# Patient Record
Sex: Female | Born: 1935 | Race: Asian | Hispanic: No | Marital: Single | Smoking: Never smoker
Health system: Southern US, Community
[De-identification: ages and names within clinical notes are randomized; demographics above are authoritative.]

## PROBLEM LIST (undated history)

## (undated) DIAGNOSIS — I10 Essential (primary) hypertension: Secondary | ICD-10-CM

## (undated) DIAGNOSIS — K219 Gastro-esophageal reflux disease without esophagitis: Secondary | ICD-10-CM

---

## 2014-01-07 ENCOUNTER — Inpatient Hospital Stay (HOSPITAL_COMMUNITY)
Admission: EM | Admit: 2014-01-07 | Discharge: 2014-01-11 | DRG: 683 | Disposition: A | Discharge status: Home / Self Care | Payer: Medicare Other | Attending: Internal Medicine | Admitting: Internal Medicine

## 2014-01-07 ENCOUNTER — Emergency Department (HOSPITAL_COMMUNITY): Payer: Medicare Other

## 2014-01-07 ENCOUNTER — Encounter (HOSPITAL_COMMUNITY): Payer: Self-pay | Admitting: Emergency Medicine

## 2014-01-07 DIAGNOSIS — R404 Transient alteration of awareness: Secondary | ICD-10-CM | POA: Diagnosis not present

## 2014-01-07 DIAGNOSIS — K560 Paralytic ileus: Secondary | ICD-10-CM | POA: Diagnosis present

## 2014-01-07 DIAGNOSIS — N19 Unspecified kidney failure: Secondary | ICD-10-CM | POA: Diagnosis not present

## 2014-01-07 DIAGNOSIS — R5383 Other fatigue: Secondary | ICD-10-CM

## 2014-01-07 DIAGNOSIS — Z79899 Other long term (current) drug therapy: Secondary | ICD-10-CM

## 2014-01-07 DIAGNOSIS — N1330 Unspecified hydronephrosis: Secondary | ICD-10-CM

## 2014-01-07 DIAGNOSIS — K219 Gastro-esophageal reflux disease without esophagitis: Secondary | ICD-10-CM | POA: Diagnosis present

## 2014-01-07 DIAGNOSIS — R5381 Other malaise: Secondary | ICD-10-CM

## 2014-01-07 DIAGNOSIS — Z7982 Long term (current) use of aspirin: Secondary | ICD-10-CM | POA: Diagnosis not present

## 2014-01-07 DIAGNOSIS — R109 Unspecified abdominal pain: Secondary | ICD-10-CM | POA: Diagnosis present

## 2014-01-07 DIAGNOSIS — Z23 Encounter for immunization: Secondary | ICD-10-CM | POA: Diagnosis not present

## 2014-01-07 DIAGNOSIS — L259 Unspecified contact dermatitis, unspecified cause: Secondary | ICD-10-CM | POA: Diagnosis present

## 2014-01-07 DIAGNOSIS — E871 Hypo-osmolality and hyponatremia: Secondary | ICD-10-CM | POA: Diagnosis present

## 2014-01-07 DIAGNOSIS — R1030 Lower abdominal pain, unspecified: Secondary | ICD-10-CM

## 2014-01-07 DIAGNOSIS — N179 Acute kidney failure, unspecified: Principal | ICD-10-CM | POA: Diagnosis present

## 2014-01-07 DIAGNOSIS — R339 Retention of urine, unspecified: Secondary | ICD-10-CM | POA: Diagnosis present

## 2014-01-07 DIAGNOSIS — R609 Edema, unspecified: Secondary | ICD-10-CM

## 2014-01-07 DIAGNOSIS — R1031 Right lower quadrant pain: Secondary | ICD-10-CM | POA: Diagnosis not present

## 2014-01-07 DIAGNOSIS — R531 Weakness: Secondary | ICD-10-CM | POA: Diagnosis present

## 2014-01-07 DIAGNOSIS — M7989 Other specified soft tissue disorders: Secondary | ICD-10-CM | POA: Diagnosis not present

## 2014-01-07 DIAGNOSIS — K567 Ileus, unspecified: Secondary | ICD-10-CM

## 2014-01-07 DIAGNOSIS — R600 Localized edema: Secondary | ICD-10-CM

## 2014-01-07 HISTORY — DX: Gastro-esophageal reflux disease without esophagitis: K21.9

## 2014-01-07 LAB — URINALYSIS, ROUTINE W REFLEX MICROSCOPIC
BILIRUBIN URINE: NEGATIVE
Glucose, UA: NEGATIVE mg/dL
Hgb urine dipstick: NEGATIVE
KETONES UR: NEGATIVE mg/dL
Leukocytes, UA: NEGATIVE
NITRITE: NEGATIVE
Protein, ur: NEGATIVE mg/dL
Specific Gravity, Urine: 1.014 (ref 1.005–1.030)
UROBILINOGEN UA: 0.2 mg/dL (ref 0.0–1.0)
pH: 5.5 (ref 5.0–8.0)

## 2014-01-07 LAB — CBC WITH DIFFERENTIAL/PLATELET
BASOS ABS: 0 10*3/uL (ref 0.0–0.1)
BASOS PCT: 0 % (ref 0–1)
EOS PCT: 1 % (ref 0–5)
Eosinophils Absolute: 0.1 10*3/uL (ref 0.0–0.7)
HEMATOCRIT: 30.7 % — AB (ref 36.0–46.0)
HEMOGLOBIN: 11.1 g/dL — AB (ref 12.0–15.0)
Lymphocytes Relative: 20 % (ref 12–46)
Lymphs Abs: 2.3 10*3/uL (ref 0.7–4.0)
MCH: 30.7 pg (ref 26.0–34.0)
MCHC: 36.2 g/dL — AB (ref 30.0–36.0)
MCV: 84.8 fL (ref 78.0–100.0)
MONOS PCT: 6 % (ref 3–12)
Monocytes Absolute: 0.7 10*3/uL (ref 0.1–1.0)
Neutro Abs: 8.6 10*3/uL — ABNORMAL HIGH (ref 1.7–7.7)
Neutrophils Relative %: 73 % (ref 43–77)
Platelets: 240 10*3/uL (ref 150–400)
RBC: 3.62 MIL/uL — ABNORMAL LOW (ref 3.87–5.11)
RDW: 12.2 % (ref 11.5–15.5)
WBC: 11.7 10*3/uL — ABNORMAL HIGH (ref 4.0–10.5)

## 2014-01-07 LAB — COMPREHENSIVE METABOLIC PANEL
ALBUMIN: 3.5 g/dL (ref 3.5–5.2)
ALT: 12 U/L (ref 0–35)
AST: 20 U/L (ref 0–37)
Alkaline Phosphatase: 82 U/L (ref 39–117)
Anion gap: 21 — ABNORMAL HIGH (ref 5–15)
BILIRUBIN TOTAL: 1 mg/dL (ref 0.3–1.2)
BUN: 78 mg/dL — ABNORMAL HIGH (ref 6–23)
CALCIUM: 9 mg/dL (ref 8.4–10.5)
CHLORIDE: 77 meq/L — AB (ref 96–112)
CO2: 22 meq/L (ref 19–32)
Creatinine, Ser: 4.55 mg/dL — ABNORMAL HIGH (ref 0.50–1.10)
GFR calc Af Amer: 9 mL/min — ABNORMAL LOW (ref >90)
GFR, EST NON AFRICAN AMERICAN: 8 mL/min — AB (ref >90)
Glucose, Bld: 96 mg/dL (ref 70–99)
Potassium: 4.1 mEq/L (ref 3.7–5.3)
Sodium: 120 mEq/L — CL (ref 137–147)
Total Protein: 7.6 g/dL (ref 6.0–8.3)

## 2014-01-07 LAB — I-STAT TROPONIN, ED: TROPONIN I, POC: 0.04 ng/mL (ref 0.00–0.08)

## 2014-01-07 LAB — PRO B NATRIURETIC PEPTIDE: PRO B NATRI PEPTIDE: 592.6 pg/mL — AB (ref 0–450)

## 2014-01-07 LAB — LIPASE, BLOOD: LIPASE: 72 U/L — AB (ref 11–59)

## 2014-01-07 MED ORDER — ACYCLOVIR 5 % EX OINT
TOPICAL_OINTMENT | Freq: Four times a day (QID) | CUTANEOUS | Status: AC
  Administered 2014-01-07 – 2014-01-08 (×3): via TOPICAL
  Filled 2014-01-07: qty 30

## 2014-01-07 MED ORDER — ENOXAPARIN SODIUM 30 MG/0.3ML ~~LOC~~ SOLN: 30.0000 mg | SUBCUTANEOUS | Status: DC

## 2014-01-07 MED ORDER — B COMPLEX PO TABS: 1.0000 | ORAL_TABLET | Freq: Every day | ORAL | Status: DC

## 2014-01-07 MED ORDER — SODIUM CHLORIDE 0.9 % IV BOLUS (SEPSIS)
500.0000 mL | Freq: Once | INTRAVENOUS | Status: AC
  Administered 2014-01-07: 500 mL via INTRAVENOUS

## 2014-01-07 MED ORDER — PANTOPRAZOLE SODIUM 40 MG PO TBEC
40.0000 mg | DELAYED_RELEASE_TABLET | Freq: Every day | ORAL | Status: DC
  Administered 2014-01-08 – 2014-01-11 (×4): 40 mg via ORAL
  Filled 2014-01-07 (×5): qty 1

## 2014-01-07 MED ORDER — OXYCODONE HCL 5 MG PO TABS
5.0000 mg | ORAL_TABLET | ORAL | Status: DC | PRN
Start: 2014-01-07 — End: 2014-01-11

## 2014-01-07 MED ORDER — HYDROMORPHONE HCL PF 1 MG/ML IJ SOLN: 0.5000 mg | INTRAMUSCULAR | Status: DC | PRN

## 2014-01-07 MED ORDER — ENOXAPARIN SODIUM 30 MG/0.3ML ~~LOC~~ SOLN
30.0000 mg | Freq: Once | SUBCUTANEOUS | Status: DC
  Filled 2014-01-07: qty 0.3

## 2014-01-07 MED ORDER — SODIUM CHLORIDE 0.9 % IV SOLN
INTRAVENOUS | Status: DC
  Administered 2014-01-07 – 2014-01-10 (×5): via INTRAVENOUS

## 2014-01-07 MED ORDER — CALCIUM CARBONATE-VITAMIN D 500-200 MG-UNIT PO TABS
1.0000 | ORAL_TABLET | Freq: Every day | ORAL | Status: DC
  Administered 2014-01-08 – 2014-01-11 (×4): 1 via ORAL
  Filled 2014-01-07 (×6): qty 1

## 2014-01-07 MED ORDER — DOCUSATE SODIUM 100 MG PO CAPS
100.0000 mg | ORAL_CAPSULE | Freq: Every day | ORAL | Status: DC | PRN
  Filled 2014-01-07: qty 1

## 2014-01-07 MED ORDER — ONDANSETRON HCL 4 MG/2ML IJ SOLN: 4.0000 mg | Freq: Four times a day (QID) | INTRAMUSCULAR | Status: DC | PRN

## 2014-01-07 MED ORDER — HEPARIN SODIUM (PORCINE) 5000 UNIT/ML IJ SOLN
5000.0000 [IU] | Freq: Three times a day (TID) | INTRAMUSCULAR | Status: DC
  Administered 2014-01-07 – 2014-01-10 (×8): 5000 [IU] via SUBCUTANEOUS
  Filled 2014-01-07 (×13): qty 1

## 2014-01-07 MED ORDER — B COMPLEX-C PO TABS
1.0000 | ORAL_TABLET | Freq: Every day | ORAL | Status: DC
  Administered 2014-01-08 – 2014-01-11 (×4): 1 via ORAL
  Filled 2014-01-07 (×5): qty 1

## 2014-01-07 MED ORDER — ASPIRIN EC 81 MG PO TBEC
81.0000 mg | DELAYED_RELEASE_TABLET | Freq: Every day | ORAL | Status: DC
  Administered 2014-01-08 – 2014-01-11 (×4): 81 mg via ORAL
  Filled 2014-01-07 (×5): qty 1

## 2014-01-07 MED ORDER — ACETAMINOPHEN 325 MG PO TABS
650.0000 mg | ORAL_TABLET | Freq: Four times a day (QID) | ORAL | Status: DC | PRN
  Filled 2014-01-07: qty 2

## 2014-01-07 MED ORDER — SODIUM CHLORIDE 0.9 % IJ SOLN
3.0000 mL | Freq: Two times a day (BID) | INTRAMUSCULAR | Status: DC
  Administered 2014-01-07 – 2014-01-10 (×3): 3 mL via INTRAVENOUS

## 2014-01-07 MED ORDER — ACYCLOVIR 5 % EX CREA
TOPICAL_CREAM | Freq: Four times a day (QID) | CUTANEOUS | Status: DC
  Filled 2014-01-07: qty 5

## 2014-01-07 MED ORDER — ONDANSETRON HCL 4 MG PO TABS: 4.0000 mg | ORAL_TABLET | Freq: Four times a day (QID) | ORAL | Status: DC | PRN

## 2014-01-07 MED ORDER — ACETAMINOPHEN 650 MG RE SUPP: 650.0000 mg | Freq: Four times a day (QID) | RECTAL | Status: DC | PRN

## 2014-01-07 NOTE — ED Notes (Signed)
Pt to CT

## 2014-01-07 NOTE — ED Notes (Signed)
Dr. Fredderick Phenix aware of critical NA.

## 2014-01-07 NOTE — ED Notes (Signed)
Initial Contact - pt A+Ox4, very hard of hearing, reports chronic BLE swelling "for 25 years" but worsening x4 days to the point that she is unable to ambulate, pt also with rash/excoriated/open area approx 5" diameter to R posterior thigh, pt reports "it's itchy", per family present x2 days. Pt denies cp/sob, speaking full/clear sentences, rr even/un-lab.  Skin otherwise PWD.  MAEI.  NAD.

## 2014-01-07 NOTE — ED Provider Notes (Signed)
CSN: [id number]     Arrival date & time 01/07/14  1414 History   First MD Initiated Contact with Patient 01/07/14 1504     Chief Complaint  Patient presents with  . Rash  . Leg Swelling     (Consider location/radiation/quality/duration/timing/severity/associated sxs/prior Treatment) HPI Comments: Pt presents with her son with complaints of generalized fatigue, weakness and SOB which has been worsening over the last 4-5 days. She's currently unable to ambulate on her own which she was previously doing. She was at home with her son. She's had some shortness of breath on exertion. She has no cough or chest discomfort. She's had no fevers or chills. She's had some burning on urination. Her son also noticed a rash on her right leg when he is open to the bathroom. She hasn't complained of it and she doesn't know what it started. She's not complaining of any pain to that area. She's had some nausea and decreased appetite. He says that she hasn't been any or drinking for the last few days. They haven't noted any fevers. She's having some alternating constipation and diarrhea. She's had marked swelling in both of her legs. She has chronic edema in both legs but her sense states that the edema has increased about 20% over the last few days. They did take a recent trip to [place].  Patient is a 80 y.o. female presenting with rash.  Rash Associated symptoms: fatigue, nausea and shortness of breath   Associated symptoms: no abdominal pain, no diarrhea, no fever, no headaches, no joint pain and not vomiting     Past Medical History  Diagnosis Date  . GERD (gastroesophageal reflux disease)    History reviewed. No pertinent past surgical history. No family history on file. History  Substance Use Topics  . Smoking status: Never Smoker   . Smokeless tobacco: Not on file  . Alcohol Use: No   OB History   Grav Para Term Preterm Abortions TAB SAB Ect Mult Living                 Review of Systems   Constitutional: Positive for appetite change and fatigue. Negative for fever, chills and diaphoresis.  HENT: Negative for congestion, rhinorrhea and sneezing.   Eyes: Negative.   Respiratory: Positive for shortness of breath. Negative for cough and chest tightness.   Cardiovascular: Positive for leg swelling. Negative for chest pain.  Gastrointestinal: Positive for nausea. Negative for vomiting, abdominal pain, diarrhea and blood in stool.  Genitourinary: Positive for dysuria. Negative for frequency, hematuria, flank pain and difficulty urinating.  Musculoskeletal: Negative for arthralgias and back pain.  Skin: Positive for rash.  Neurological: Positive for weakness (generalized). Negative for dizziness, speech difficulty, numbness and headaches.      Allergies  Review of patient's allergies indicates no known allergies.  Home Medications   Prior to Admission medications   Medication Sig Start Date End Date Taking? Authorizing Provider  aspirin EC 81 MG tablet Take 81 mg by mouth daily.   Yes Historical Provider, MD  b complex vitamins tablet Take 1 tablet by mouth daily.   Yes Historical Provider, MD  calcium-vitamin D (OSCAL WITH D) 500-200 MG-UNIT per tablet Take 1 tablet by mouth daily with breakfast.   Yes Historical Provider, MD  docusate sodium (COLACE) 100 MG capsule Take 100 mg by mouth daily as needed for mild constipation.   Yes Historical Provider, MD  Flaxseed, Linseed, (FLAX SEEDS) POWD Take 5 mLs by mouth 3 (three)  times daily.   Yes Historical Provider, MD  furosemide (LASIX) 20 MG tablet Take 20 mg by mouth daily as needed for fluid.   Yes Historical Provider, MD  omeprazole (PRILOSEC) 20 MG capsule Take 20 mg by mouth daily.   Yes Historical Provider, MD  tetrahydrozoline (VISINE) 0.05 % ophthalmic solution Place 1 drop into both eyes 2 (two) times daily.   Yes Historical Provider, MD   BP 121/66  Pulse 81  Temp(Src) 98.4 F (36.9 C) (Oral)  Resp 17  SpO2  99% Physical Exam  Constitutional: She is oriented to person, place, and time. She appears well-developed and well-nourished.  HENT:  Head: Normocephalic and atraumatic.  Eyes: Pupils are equal, round, and reactive to light.  Neck: Normal range of motion. Neck supple.  Cardiovascular: Normal rate, regular rhythm and normal heart sounds.   Pulmonary/Chest: Effort normal and breath sounds normal. No respiratory distress. She has no wheezes. She has no rales. She exhibits no tenderness.  Abdominal: Soft. Bowel sounds are normal. There is no tenderness. There is no rebound and no guarding.  Musculoskeletal: Normal range of motion. She exhibits edema (marked bilateral pitting edema).  Lymphadenopathy:    She has no cervical adenopathy.  Neurological: She is alert and oriented to person, place, and time.  Skin: Skin is warm and dry. Rash noted.  Erythematous weeping rash to right upper posterior thigh.  Some dark vesicles.  Psychiatric: She has a normal mood and affect.    ED Course  Procedures (including critical care time) Labs Review Results for orders placed during the hospital encounter of 01/07/14  CBC WITH DIFFERENTIAL      Result Value Ref Range   WBC 11.7 (*) 4.0 - 10.5 K/uL   RBC 3.62 (*) 3.87 - 5.11 MIL/uL   Hemoglobin 11.1 (*) 12.0 - 15.0 g/dL   HCT 16.1 (*) 09.6 - 04.5 %   MCV 84.8  78.0 - 100.0 fL   MCH 30.7  26.0 - 34.0 pg   MCHC 36.2 (*) 30.0 - 36.0 g/dL   RDW 40.9  81.1 - 91.4 %   Platelets 240  150 - 400 K/uL   Neutrophils Relative % 73  43 - 77 %   Neutro Abs 8.6 (*) 1.7 - 7.7 K/uL   Lymphocytes Relative 20  12 - 46 %   Lymphs Abs 2.3  0.7 - 4.0 K/uL   Monocytes Relative 6  3 - 12 %   Monocytes Absolute 0.7  0.1 - 1.0 K/uL   Eosinophils Relative 1  0 - 5 %   Eosinophils Absolute 0.1  0.0 - 0.7 K/uL   Basophils Relative 0  0 - 1 %   Basophils Absolute 0.0  0.0 - 0.1 K/uL  COMPREHENSIVE METABOLIC PANEL      Result Value Ref Range   Sodium 120 (*) 137 - 147  mEq/L   Potassium 4.1  3.7 - 5.3 mEq/L   Chloride 77 (*) 96 - 112 mEq/L   CO2 22  19 - 32 mEq/L   Glucose, Bld 96  70 - 99 mg/dL   BUN 78 (*) 6 - 23 mg/dL   Creatinine, Ser 7.82 (*) 0.50 - 1.10 mg/dL   Calcium 9.0  8.4 - 95.6 mg/dL   Total Protein 7.6  6.0 - 8.3 g/dL   Albumin 3.5  3.5 - 5.2 g/dL   AST 20  0 - 37 U/L   ALT 12  0 - 35 U/L   Alkaline Phosphatase 82  39 - 117 U/L   Total Bilirubin 1.0  0.3 - 1.2 mg/dL   GFR calc non Af Amer 8 (*) >90 mL/min   GFR calc Af Amer 9 (*) >90 mL/min   Anion gap 21 (*) 5 - 15  LIPASE, BLOOD      Result Value Ref Range   Lipase 72 (*) 11 - 59 U/L  URINALYSIS, ROUTINE W REFLEX MICROSCOPIC      Result Value Ref Range   Color, Urine AMBER (*) YELLOW   APPearance CLEAR  CLEAR   Specific Gravity, Urine 1.014  1.005 - 1.030   pH 5.5  5.0 - 8.0   Glucose, UA NEGATIVE  NEGATIVE mg/dL   Hgb urine dipstick NEGATIVE  NEGATIVE   Bilirubin Urine NEGATIVE  NEGATIVE   Ketones, ur NEGATIVE  NEGATIVE mg/dL   Protein, ur NEGATIVE  NEGATIVE mg/dL   Urobilinogen, UA 0.2  0.0 - 1.0 mg/dL   Nitrite NEGATIVE  NEGATIVE   Leukocytes, UA NEGATIVE  NEGATIVE  PRO B NATRIURETIC PEPTIDE      Result Value Ref Range   Pro B Natriuretic peptide (BNP) 592.6 (*) 0 - 450 pg/mL  I-STAT TROPOININ, ED      Result Value Ref Range   Troponin i, poc 0.04  0.00 - 0.08 ng/mL   Comment 3            Dg Abd Acute W/chest  01/07/2014   CLINICAL DATA:  Right lower abdominal pain.  Swelling in both legs.  EXAM: ACUTE ABDOMEN SERIES (ABDOMEN 2 VIEW & CHEST 1 VIEW)  FINDINGS: Atherosclerotic calcifications are present in the aortic arch. Heart size normal. Mild interstitial coarsening is chronic.  Fluid levels are present within nondilated loops of small bowel and the right colon. There is no obstruction. There is no free air.  IMPRESSION: 1. Fluid levels within nondilated loops of large and small bowel suggesting an ileus. 2. No evidence for obstruction or free air. 3. Atherosclerosis.  4. Interstitial coarsening is likely chronic.   Electronically Signed   By: Gennette Pac M.D.   On: 01/07/2014 17:12     Imaging Review Ct Abdomen Pelvis Wo Contrast  01/07/2014   CLINICAL DATA:  Bilateral leg swelling  EXAM: CT ABDOMEN AND PELVIS WITHOUT CONTRAST  TECHNIQUE: Multidetector CT imaging of the abdomen and pelvis was performed following the standard protocol without IV contrast.  COMPARISON:  None.  FINDINGS: Lung bases are free of acute infiltrate or sizable effusion.  The liver, gallbladder, spleen, adrenal glands and pancreas are within normal limits.  The kidneys are well visualized bilaterally and reveal no evidence of renal calculi. Fullness of the collecting systems is noted bilaterally. The bladder is significantly distended. No definitive calculi are seen. This raises suspicion for urinary retention and chronic hydronephrosis. The appendix is within normal limits. Mild diverticular change is seen without diverticulitis. The osseous structures show chronic compression deformities of L2 and T12. Degenerative changes of lumbar spine are noted as well.  IMPRESSION: Over dilatation of the bladder with associated hydronephrosis. No definitive obstructing lesions are seen and this is felt to be related to the over distension of the bladder.  No other acute abnormality is noted.   Electronically Signed   By: Alcide Clever M.D.   On: 01/07/2014 19:16   Dg Abd Acute W/chest  01/07/2014   CLINICAL DATA:  Right lower abdominal pain.  Swelling in both legs.  EXAM: ACUTE ABDOMEN SERIES (ABDOMEN 2 VIEW & CHEST 1 VIEW)  FINDINGS: Atherosclerotic calcifications are present in the aortic arch. Heart size normal. Mild interstitial coarsening is chronic.  Fluid levels are present within nondilated loops of small bowel and the right colon. There is no obstruction. There is no free air.  IMPRESSION: 1. Fluid levels within nondilated loops of large and small bowel suggesting an ileus. 2. No evidence for  obstruction or free air. 3. Atherosclerosis. 4. Interstitial coarsening is likely chronic.   Electronically Signed   By: Gennette Pac M.D.   On: 01/07/2014 17:12     EKG Interpretation   Date/Time:  Wednesday January 07 2014 15:42:42 EDT Ventricular Rate:  87 PR Interval:  162 QRS Duration: 81 QT Interval:  414 QTC Calculation: 498 R Axis:   -5 Text Interpretation:  Sinus rhythm Low voltage, precordial leads  Borderline repolarization abnormality Borderline prolonged QT interval  Baseline wander in lead(s) I III aVL No old tracing to compare Confirmed  by Decari Duggar  MD, Tommye Lehenbauer ([id number]) on 01/07/2014 7:39:15 PM      MDM   Final diagnoses:  Renal failure  Hyponatremia  Urinary retention    Patient presents with generalized weakness and decreased appetite. She also had some abdominal tenderness on exam and lower extremity edema. She is in acute renal failure with marked hyponatremia. Her potassium is normal. I feel this likely could be related to significant urinary retention which was noted on the CT scan of her abdomen and pelvis. A Foley catheter will be placed. She was given normal saline resuscitation. She did have bilateral lower extremity Dopplers which were negative for DVT. I will consult hospitalist service for admission.    Rolan Bucco, MD 01/07/14 [phone]

## 2014-01-07 NOTE — H&P (Addendum)
[facility] Admission History and Physical       Demira Gwynne ZOX:[id number] DOB: Jun 05, 1921 DOA: 01/07/2014  Referring physician:  EDP PCP: No primary provider on file.  Specialists:   Chief Complaint:  Weakness and increased Swelling of both legs  HPI: Elizabeth Jarvis is a 80 y.o. female with a history of GERD, and Chronic Lower Extremity Edema who was brought to the ED by her son due to complaints of worsening weakness and confusion over the past week, along with complaitnts of nausea and lower ABD pain and decreased urination  Over the past 3-4 days.   She has not had any fever or chills. She also has not had any vomiting or diarrhea.  She was having increased difficulty walking due to worsening of her lower leg edema, of which her son reports that both of her legs are 20% more swollen than her usual.    When her son had to help her to the bathroom today and he noticed that she had a rash on her right buttock area, and they both are not aware of how long the rash has been there.   She denies pain to the rash but reports some itching an discomfort.       In the ED, she was found to have Hyponatremia at a level of 120, and a BUN/Cr of 78/4.55.    A KUB and a Ct Scan of the ABD pelvis was performed which revealed an Ileus, and Bilateral Hydronephrosis respectively.   Venous Duplex US Studies were performed of both lower legs and were found to be negative for DVTs.     Review of Systems:  Constitutional: No Weight Loss, No Weight Gain, Night Sweats, Fevers, Chills, Dizziness, +Fatigue, or +Generalized Weakness HEENT: No Headaches, Difficulty Swallowing,Tooth/Dental Problems,Sore Throat,  No Sneezing, Rhinitis, Ear Ache, Nasal Congestion, or Post Nasal Drip,  Cardio-vascular:  No Chest pain, Orthopnea, PND, Edema in Lower Extremities, Anasarca, Dizziness, Palpitations  Resp: +Dyspnea, +DOE, NoCough, No Hemoptysis, No Wheezing.    GI: No Heartburn, Indigestion, +Abdominal Pain, +Nausea,  Vomiting, Diarrhea, Hematemesis, Hematochezia, Melena, Change in Bowel Habits,  Loss of Appetite  GU: No Dysuria, Change in Color of Urine, No Urgency or Frequency, +Urinary Retention, No Flank pain.  Musculoskeletal: No Joint Pain or Swelling, No Decreased Range of Motion, No Back Pain.  Neurologic: No Syncope, No Seizures, Muscle Weakness, Paresthesia, Vision Disturbance or Loss, No Diplopia, No Vertigo, +Difficulty Walking,  Skin: +Rash on Right Buttocks,  or Lesions. Psych: No Change in Mood or Affect, No Depression or Anxiety, No Memory loss, No Confusion, or Hallucinations   Past Medical History  Diagnosis Date  . GERD (gastroesophageal reflux disease)       History reviewed. No pertinent past surgical history.     Prior to Admission medications   Medication Sig Start Date End Date Taking? Authorizing Provider  aspirin EC 81 MG tablet Take 81 mg by mouth daily.   Yes Historical Provider, MD  b complex vitamins tablet Take 1 tablet by mouth daily.   Yes Historical Provider, MD  calcium-vitamin D (OSCAL WITH D) 500-200 MG-UNIT per tablet Take 1 tablet by mouth daily with breakfast.   Yes Historical Provider, MD  docusate sodium (COLACE) 100 MG capsule Take 100 mg by mouth daily as needed for mild constipation.   Yes Historical Provider, MD  Flaxseed, Linseed, (FLAX SEEDS) POWD Take 5 mLs by mouth 3 (three) times daily.   Yes Historical Provider, MD  furosemide (LASIX) 20  MG tablet Take 20 mg by mouth daily as needed for fluid.   Yes Historical Provider, MD  omeprazole (PRILOSEC) 20 MG capsule Take 20 mg by mouth daily.   Yes Historical Provider, MD  tetrahydrozoline (VISINE) 0.05 % ophthalmic solution Place 1 drop into both eyes 2 (two) times daily.   Yes Historical Provider, MD      No Known Allergies   Social History:  reports that she has never smoked. She does not have any smokeless tobacco history on file. She reports that she does not drink alcohol. Her drug history is  not on file.     No family history on file.     Physical Exam:  GEN:  Pleasant Obese Elderly 80 y.o. [place] female examined and in no acute distress; cooperative with exam Filed Vitals:   01/07/14 1741 01/07/14 1800 01/07/14 1930 01/07/14 2000  BP: 119/71 148/66 121/66 113/52  Pulse: 86 88 81 82  Temp:      TempSrc:      Resp: SpO2: 99% 100% 99% 100%   Blood pressure 113/52, pulse 82, temperature 98.4 F (36.9 C), temperature source Oral, resp. rate 19, SpO2 100.00%. PSYCH: She is alert and oriented x4; does not appear anxious does not appear depressed; affect is normal HEENT: Normocephalic and Atraumatic, Mucous membranes pink; PERRLA; EOM intact; Fundi:  Benign;  No scleral icterus, Nares: Patent, Oropharynx: Clear, Edentulous  With Dentures,    Neck:  FROM, No Cervical Lymphadenopathy nor Thyromegaly or Carotid Bruit; No JVD; Breasts:: Not examined CHEST WALL: No tenderness CHEST: Normal respiration, clear to auscultation bilaterally HEART: Regular rate and rhythm; no murmurs rubs or gallops BACK: No kyphosis or scoliosis; No CVA tenderness ABDOMEN: Positive Bowel Sounds, Obese, Soft Non-Tender; No Masses, No Organomegaly. Rectal Exam: Not done EXTREMITIES: No Cyanosis, Clubbing, #+ BLE Edema;  Genitalia: not examined PULSES: 2+ and symmetric SKIN: Normal hydration,  +Excorated Vesicular on Right Buttocks Area CNS: Alert and Oriented x4, No Focal Deficits, +Generalized Weakness  Vascular: pulses palpable throughout    Labs on Admission:  Basic Metabolic Panel:  Recent Labs Lab 01/07/14 1631  NA 120*  K 4.1  CL 77*  CO2 22  GLUCOSE 96  BUN 78*  CREATININE 4.55*  CALCIUM 9.0   Liver Function Tests:  Recent Labs Lab 01/07/14 1631  AST 20  ALT 12  ALKPHOS 82  BILITOT 1.0  PROT 7.6  ALBUMIN 3.5    Recent Labs Lab 01/07/14 1631  LIPASE 72*   No results found for this basename: AMMONIA,  in the last 168 hours CBC:  Recent Labs Lab  01/07/14 1631  WBC 11.7*  NEUTROABS 8.6*  HGB 11.1*  HCT 30.7*  MCV 84.8  PLT 240   Cardiac Enzymes: No results found for this basename: CKTOTAL, CKMB, CKMBINDEX, TROPONINI,  in the last 168 hours  BNP (last 3 results)  Recent Labs  01/07/14 1631  PROBNP 592.6*   CBG: No results found for this basename: GLUCAP,  in the last 168 hours  Radiological Exams on Admission: Ct Abdomen Pelvis Wo Contrast  01/07/2014   CLINICAL DATA:  Bilateral leg swelling  EXAM: CT ABDOMEN AND PELVIS WITHOUT CONTRAST  TECHNIQUE: Multidetector CT imaging of the abdomen and pelvis was performed following the standard protocol without IV contrast.  COMPARISON:  None.  FINDINGS: Lung bases are free of acute infiltrate or sizable effusion.  The liver, gallbladder, spleen, adrenal glands and pancreas are within normal limits.  The kidneys are well visualized bilaterally and reveal no evidence of renal calculi. Fullness of the collecting systems is noted bilaterally. The bladder is significantly distended. No definitive calculi are seen. This raises suspicion for urinary retention and chronic hydronephrosis. The appendix is within normal limits. Mild diverticular change is seen without diverticulitis. The osseous structures show chronic compression deformities of L2 and T12. Degenerative changes of lumbar spine are noted as well.  IMPRESSION: Over dilatation of the bladder with associated hydronephrosis. No definitive obstructing lesions are seen and this is felt to be related to the over distension of the bladder.  No other acute abnormality is noted.   Electronically Signed   By: Alcide Clever M.D.   On: 01/07/2014 19:16   Dg Abd Acute W/chest  01/07/2014   CLINICAL DATA:  Right lower abdominal pain.  Swelling in both legs.  EXAM: ACUTE ABDOMEN SERIES (ABDOMEN 2 VIEW & CHEST 1 VIEW)  FINDINGS: Atherosclerotic calcifications are present in the aortic arch. Heart size normal. Mild interstitial coarsening is chronic.  Fluid  levels are present within nondilated loops of small bowel and the right colon. There is no obstruction. There is no free air.  IMPRESSION: 1. Fluid levels within nondilated loops of large and small bowel suggesting an ileus. 2. No evidence for obstruction or free air. 3. Atherosclerosis. 4. Interstitial coarsening is likely chronic.   Electronically Signed   By: Gennette Pac M.D.   On: 01/07/2014 17:12     EKG: Independently reviewed.  Sinus Rhythm at 87, Diffuse Artifact   Assessment/Plan:   80 y.o. female with  Principal Problem:   1.   Hyponatremia   IVFs with NSS, hydrate   Check Urine Na+, and Urine Osm   Monitor Na+  Active Problems:   2.   Renal failure   IVFs    Monitor BUN/Cr     3.   Hydronephrosis, bilateral   Foley Cath PRN   Strict I/Os   May Need Urology Consult    4.   Urinary retention   Foley cath PRN   Monitor I/Os     5.   Edema of both legs   Acute on chronic Edema- due to ARF, Nephrosis, and /or Lymphedema or Venous Insufficiency     6.   Weakness generalized   Due to #1, and #2   May Need Rehab for Reconditioning     7.   Abdominal pain, lower   Due to #8, and #3 and #4.       8.   Ileus   Anti-Emetics PRN   IV Reglan    9.   Dermatitis   ? Resolved Zoster , Droplet PreCautions    Acyclovir Cream QID to Rash x 3 days    10.   DVT Prophylaxis   Lovenox      Code Status:     FULL CODE Family Communication:    Son at Bedside Disposition Plan:     Inpatient  Time spent:   61 Minutes  Ron Parker [facility] Pager [phone]   If 7AM -7PM Please Contact the Day Rounding Team MD for [facility]  If 7PM-7AM, Please Contact night-coverage  [email or web address] Password [facility] 01/07/2014, 8:42 PM

## 2014-01-07 NOTE — Progress Notes (Signed)
*  PRELIMINARY RESULTS* Vascular Ultrasound Lower extremity venous duplex has been completed.  Preliminary findings: no obvious evidence of DVT  Farrel Demark, RDMS, RVT  01/07/2014, 4:10 PM

## 2014-01-07 NOTE — Progress Notes (Signed)
Utilization Review completed.  Sissi Padia RN CM  

## 2014-01-07 NOTE — ED Notes (Signed)
Tried to get a urine sample from the pt, she could not [place] it at this time

## 2014-01-07 NOTE — ED Notes (Signed)
Bed: WA02 Expected date:  Expected time:  Means of arrival:  Comments: EMS swelling/rash

## 2014-01-07 NOTE — ED Notes (Signed)
Pt to radiology.

## 2014-01-07 NOTE — ED Notes (Signed)
PER EMS - pt from home with c/o rash to R thigh and BLE swelling, x2 days.  Pt reports unable to walk at home.  Speaking full sentences.  Skin PWD.  A+Ox4.  Very hard of hearing.

## 2014-01-07 NOTE — ED Notes (Signed)
Pt straight cath for urine spec with Kaleen Odea, NT, sample obtained and sent to lab.  Pt tolerated procedure well.

## 2014-01-07 NOTE — ED Notes (Signed)
U/S tech at bedside for duplex.  

## 2014-01-07 NOTE — ED Notes (Signed)
Attempted to call report to floor, RN unavailable at this time.  

## 2014-01-07 NOTE — ED Notes (Addendum)
Unable to obtain labs at this time, phlebotomist to attempt. Pt unable to provide urine sample at this time.

## 2014-01-07 NOTE — ED Notes (Addendum)
Sodium of 120 critical lab result. Annabelle Harman, RN made aware.

## 2014-01-07 NOTE — Progress Notes (Signed)
  CARE MANAGEMENT ED NOTE 01/07/2014  Patient:  Elizabeth Jarvis, Elizabeth Jarvis   Account Number:  [id number]  Date Initiated:  01/07/2014  Documentation initiated by:  Radford Pax  Subjective/Objective Assessment:   Patient presents to Ed with  rash to R thigh and BLE swelling, x2 days     Subjective/Objective Assessment Detail:     Action/Plan:   Action/Plan Detail:   Anticipated DC Date:       Status Recommendation to Physician:   Result of Recommendation:    Other ED Services  Consult Working Plan    DC Planning Services  Other  PCP issues    Choice offered to / List presented to:            Status of service:  Completed, signed off  ED Comments:   ED Comments Detail:  EDCM spoke to patient and her son at bedside.  Per patient's son, patient does not have a pcp as they have just moved here.  EDCM provided patient's son with list ofpcps who accept Medicare insurnace within a 5 mile radius to patient's zip code [id number].  Patient's son thankful for resources.  no further EDCM needs at this time.

## 2014-01-08 LAB — CBC
HCT: 26.1 % — ABNORMAL LOW (ref 36.0–46.0)
Hemoglobin: 9.5 g/dL — ABNORMAL LOW (ref 12.0–15.0)
MCH: 30.9 pg (ref 26.0–34.0)
MCHC: 36.4 g/dL — ABNORMAL HIGH (ref 30.0–36.0)
MCV: 85 fL (ref 78.0–100.0)
PLATELETS: 204 10*3/uL (ref 150–400)
RBC: 3.07 MIL/uL — ABNORMAL LOW (ref 3.87–5.11)
RDW: 12.4 % (ref 11.5–15.5)
WBC: 8 10*3/uL (ref 4.0–10.5)

## 2014-01-08 LAB — URINE CULTURE
Colony Count: NO GROWTH
Culture: NO GROWTH

## 2014-01-08 LAB — CLOSTRIDIUM DIFFICILE BY PCR: Toxigenic C. Difficile by PCR: NEGATIVE

## 2014-01-08 LAB — BASIC METABOLIC PANEL
Anion gap: 17 — ABNORMAL HIGH (ref 5–15)
BUN: 71 mg/dL — AB (ref 6–23)
CO2: 24 mEq/L (ref 19–32)
Calcium: 8.2 mg/dL — ABNORMAL LOW (ref 8.4–10.5)
Chloride: 84 mEq/L — ABNORMAL LOW (ref 96–112)
Creatinine, Ser: 3.69 mg/dL — ABNORMAL HIGH (ref 0.50–1.10)
GFR, EST AFRICAN AMERICAN: 12 mL/min — AB (ref >90)
GFR, EST NON AFRICAN AMERICAN: 10 mL/min — AB (ref >90)
GLUCOSE: 91 mg/dL (ref 70–99)
Potassium: 3.2 mEq/L — ABNORMAL LOW (ref 3.7–5.3)
Sodium: 125 mEq/L — ABNORMAL LOW (ref 137–147)

## 2014-01-08 LAB — NA AND K (SODIUM & POTASSIUM), RAND UR
Potassium Urine: 46 mEq/L
SODIUM UR: 36 meq/L

## 2014-01-08 LAB — OSMOLALITY, URINE: OSMOLALITY UR: 306 mosm/kg — AB (ref 390–1090)

## 2014-01-08 MED ORDER — POLYETHYLENE GLYCOL 3350 17 G PO PACK
17.0000 g | PACK | Freq: Every day | ORAL | Status: DC
  Administered 2014-01-08: 17 g via ORAL
  Filled 2014-01-08 (×3): qty 1

## 2014-01-08 MED ORDER — POTASSIUM CHLORIDE CRYS ER 20 MEQ PO TBCR
40.0000 meq | EXTENDED_RELEASE_TABLET | Freq: Four times a day (QID) | ORAL | Status: AC
  Administered 2014-01-08: 40 meq via ORAL
  Filled 2014-01-08 (×3): qty 2

## 2014-01-08 MED ORDER — ACYCLOVIR 5 % EX OINT
TOPICAL_OINTMENT | Freq: Four times a day (QID) | CUTANEOUS | Status: DC
  Administered 2014-01-08 – 2014-01-10 (×8): via TOPICAL
  Filled 2014-01-08: qty 30

## 2014-01-08 MED ORDER — MAGNESIUM SULFATE 40 MG/ML IJ SOLN
2.0000 g | Freq: Once | INTRAMUSCULAR | Status: AC
  Administered 2014-01-08: 2 g via INTRAVENOUS
  Filled 2014-01-08 (×2): qty 50

## 2014-01-08 NOTE — Progress Notes (Signed)
[facility] PROGRESS NOTE   Krishauna Schatzman XBM:[id number] DOB: 1921/08/12 DOA: 01/07/2014 PCP: No primary provider on file.  HPI/Subjective: Feels better after the lower abdominal pain improved.  Assessment/Plan: Principal Problem:   Hyponatremia Active Problems:   Renal failure   Edema of both legs   Weakness generalized   Urinary retention   Hydronephrosis, bilateral   Abdominal pain, lower   Ileus   Acute renal failure  Unclear etiology, but most likely is secondary to obstructive uropathy. Patient has bilateral hydronephrosis and very impressive urinary bladder dilatation. Presumed acute, no evidence of renal function and the system. Placed urinary catheter, so far patient has almost 5 L of urine since last night. Follow renal function closely, if not improving might need nephrology versus urology consultation.  Hyponatremia  IVFs with NSS, hydrate  Check Urine Na+, and Urine Osm  Monitor Na+   Hydronephrosis, bilateral  Secondary to massive urinary bladder dilatation, Foley catheter placed. If renal failure and hydronephrosis not improving will call urology.  Urinary retention  Unclear etiology, Foley cath placed. Monitor I/Os   Edema of both legs  Acute on chronic Edema likely secondary to acute renal failure.  Weakness generalized  Due to #1, and #2  May Need Rehab for Reconditioning   Abdominal pain, lower  Due to #8, and #3 and #4.   Ileus  Anti-Emetics PRN  IV Reglan   Dermatitis  ? Resolved Zoster , Droplet PreCautions  Acyclovir Cream QID, keep the area dry if so it will not developed a secondary infection.    Code Status: Full code Family Communication: Plan discussed with the patient. Disposition Plan: Remains inpatient   Consultants:  None  Procedures:  On  Antibiotics:  None   Objective: Filed Vitals:   01/08/14 1049  BP: 112/43  Pulse: 88  Temp: 97.4 F (36.3 C)  Resp: 18    Intake/Output Summary (Last 24  hours) at 01/08/14 1513 Last data filed at 01/08/14 1236  Gross per 24 hour  Intake 556.25 ml  Output   4850 ml  Net -4293.75 ml   Filed Weights   01/07/14 2208  Weight: 78.4 kg (172 lb 13.5 oz)    Exam: General: Alert and awake, oriented x3, not in any acute distress. HEENT: anicteric sclera, pupils reactive to light and accommodation, EOMI CVS: S1-S2 clear, no murmur rubs or gallops Chest: clear to auscultation bilaterally, no wheezing, rales or rhonchi Abdomen: soft nontender, nondistended, normal bowel sounds, no organomegaly Extremities: no cyanosis, clubbing or edema noted bilaterally Neuro: Cranial nerves II-XII intact, no focal neurological deficits  Data Reviewed: Basic Metabolic Panel:  Recent Labs Lab 01/07/14 1631 01/08/14 0403  NA 120* 125*  K 4.1 3.2*  CL 77* 84*  CO2 22 24  GLUCOSE 96 91  BUN 78* 71*  CREATININE 4.55* 3.69*  CALCIUM 9.0 8.2*   Liver Function Tests:  Recent Labs Lab 01/07/14 1631  AST 20  ALT 12  ALKPHOS 82  BILITOT 1.0  PROT 7.6  ALBUMIN 3.5    Recent Labs Lab 01/07/14 1631  LIPASE 72*   No results found for this basename: AMMONIA,  in the last 168 hours CBC:  Recent Labs Lab 01/07/14 1631 01/08/14 0403  WBC 11.7* 8.0  NEUTROABS 8.6*  --   HGB 11.1* 9.5*  HCT 30.7* 26.1*  MCV 84.8 85.0  PLT 240 204   Cardiac Enzymes: No results found for this basename: CKTOTAL, CKMB, CKMBINDEX, TROPONINI,  in the last 168 hours BNP (last  3 results)  Recent Labs  01/07/14 1631  PROBNP 592.6*   CBG: No results found for this basename: GLUCAP,  in the last 168 hours  Micro Recent Results (from the past 240 hour(s))  CLOSTRIDIUM DIFFICILE BY PCR     Status: None   Collection Time    01/08/14  1:34 AM      Result Value Ref Range Status   C difficile by pcr NEGATIVE  NEGATIVE Final   Comment: Performed at [facility]     Studies: Ct Abdomen Pelvis Wo Contrast  01/07/2014   CLINICAL DATA:  Bilateral leg  swelling  EXAM: CT ABDOMEN AND PELVIS WITHOUT CONTRAST  TECHNIQUE: Multidetector CT imaging of the abdomen and pelvis was performed following the standard protocol without IV contrast.  COMPARISON:  None.  FINDINGS: Lung bases are free of acute infiltrate or sizable effusion.  The liver, gallbladder, spleen, adrenal glands and pancreas are within normal limits.  The kidneys are well visualized bilaterally and reveal no evidence of renal calculi. Fullness of the collecting systems is noted bilaterally. The bladder is significantly distended. No definitive calculi are seen. This raises suspicion for urinary retention and chronic hydronephrosis. The appendix is within normal limits. Mild diverticular change is seen without diverticulitis. The osseous structures show chronic compression deformities of L2 and T12. Degenerative changes of lumbar spine are noted as well.  IMPRESSION: Over dilatation of the bladder with associated hydronephrosis. No definitive obstructing lesions are seen and this is felt to be related to the over distension of the bladder.  No other acute abnormality is noted.   Electronically Signed   By: Alcide Clever M.D.   On: 01/07/2014 19:16   Dg Abd Acute W/chest  01/07/2014   CLINICAL DATA:  Right lower abdominal pain.  Swelling in both legs.  EXAM: ACUTE ABDOMEN SERIES (ABDOMEN 2 VIEW & CHEST 1 VIEW)  FINDINGS: Atherosclerotic calcifications are present in the aortic arch. Heart size normal. Mild interstitial coarsening is chronic.  Fluid levels are present within nondilated loops of small bowel and the right colon. There is no obstruction. There is no free air.  IMPRESSION: 1. Fluid levels within nondilated loops of large and small bowel suggesting an ileus. 2. No evidence for obstruction or free air. 3. Atherosclerosis. 4. Interstitial coarsening is likely chronic.   Electronically Signed   By: Gennette Pac M.D.   On: 01/07/2014 17:12    Scheduled Meds: . acyclovir ointment   Topical QID    . aspirin EC  81 mg Oral Daily  . B-complex with vitamin C  1 tablet Oral Daily  . calcium-vitamin D  1 tablet Oral Q breakfast  . heparin subcutaneous  5,000 Units Subcutaneous 3 times per day  . pantoprazole  40 mg Oral Daily  . sodium chloride  3 mL Intravenous Q12H   Continuous Infusions: . sodium chloride 75 mL/hr at 01/08/14 1252       Time spent: 35 minutes    [facility] A  [facility] Pager [phone] If 7PM-7AM, please contact night-coverage at [email or web address], password [facility] 01/08/2014, 3:13 PM  LOS: 1 day

## 2014-01-09 LAB — RENAL FUNCTION PANEL
ALBUMIN: 2.6 g/dL — AB (ref 3.5–5.2)
Anion gap: 12 (ref 5–15)
BUN: 43 mg/dL — ABNORMAL HIGH (ref 6–23)
CO2: 25 mEq/L (ref 19–32)
CREATININE: 1.88 mg/dL — AB (ref 0.50–1.10)
Calcium: 8.2 mg/dL — ABNORMAL LOW (ref 8.4–10.5)
Chloride: 94 mEq/L — ABNORMAL LOW (ref 96–112)
GFR calc Af Amer: 26 mL/min — ABNORMAL LOW (ref >90)
GFR calc non Af Amer: 22 mL/min — ABNORMAL LOW (ref >90)
Glucose, Bld: 101 mg/dL — ABNORMAL HIGH (ref 70–99)
PHOSPHORUS: 2.4 mg/dL (ref 2.3–4.6)
POTASSIUM: 3.9 meq/L (ref 3.7–5.3)
Sodium: 131 mEq/L — ABNORMAL LOW (ref 137–147)

## 2014-01-09 LAB — MAGNESIUM: Magnesium: 2.4 mg/dL (ref 1.5–2.5)

## 2014-01-09 MED ORDER — PNEUMOCOCCAL VAC POLYVALENT 25 MCG/0.5ML IJ INJ
0.5000 mL | INJECTION | INTRAMUSCULAR | Status: AC
  Administered 2014-01-10: 0.5 mL via INTRAMUSCULAR
  Filled 2014-01-09 (×2): qty 0.5

## 2014-01-09 MED ORDER — POTASSIUM CHLORIDE CRYS ER 20 MEQ PO TBCR
40.0000 meq | EXTENDED_RELEASE_TABLET | Freq: Once | ORAL | Status: AC
  Administered 2014-01-09: 40 meq via ORAL
  Filled 2014-01-09: qty 2

## 2014-01-09 MED ORDER — POTASSIUM CHLORIDE 10 MEQ/100ML IV SOLN
10.0000 meq | Freq: Once | INTRAVENOUS | Status: AC
  Administered 2014-01-09: 10 meq via INTRAVENOUS
  Filled 2014-01-09: qty 100

## 2014-01-09 MED ORDER — SACCHAROMYCES BOULARDII 250 MG PO CAPS
250.0000 mg | ORAL_CAPSULE | Freq: Two times a day (BID) | ORAL | Status: DC
Start: 2014-01-09 — End: 2014-01-11
  Administered 2014-01-09 – 2014-01-11 (×5): 250 mg via ORAL
  Filled 2014-01-09 (×6): qty 1

## 2014-01-09 NOTE — Progress Notes (Signed)
Clinical Social Work Department BRIEF PSYCHOSOCIAL ASSESSMENT 01/09/2014  Patient:  KJIRSTEN, BLOODGOOD     Account Number:  [id number]     Admit date:  01/07/2014  Clinical Social Worker:  Ulyess Blossom  Date/Time:  01/09/2014 04:30 PM  Referred by:  Physician  Date Referred:  01/09/2014 Referred for  SNF Placement   Other Referral:   Interview type:  Patient Other interview type:   patient son at bedside    PSYCHOSOCIAL DATA Living Status:  FAMILY Admitted from facility:   Level of care:   Primary support name:  Sandeep Morandi/son Primary support relationship to patient:  CHILD, ADULT Degree of support available:   adequate    CURRENT CONCERNS Current Concerns  Post-Acute Placement   Other Concerns:    SOCIAL WORK ASSESSMENT / PLAN CSW received referral for New SNF.    CSW met with pt and pt son at bedside. CSW introduced self and explained role. Pt son confirmed that pt resides with him and  just moved to [facility] from [place]. CSW discussed with pt and pt son regarding PT evaluation and recommendation for short term SNF. Pt son expressed understanding, but stated that pt does not wish to go to rehab at [facility] and prefers to return home with home health services. CSW expressed understanding. Pt son shared that he has spoken with RNCM regarding [place] needs and plans to make arrangements for additional care in the home. CSW discussed with pt son that as long as pt has 3 night inpatient stay in hospital then pt has 30 days to be placed in rehab and have Medicare cover if pt care at home becomes unmanageable. Pt son expressed understanding. Pt son hopeful that pt can return home over the w/e.    CSW confirmed with RNCM that pt plans to return home with pt son and home health services.    No further social work needs identified at this time.    CSW signing off.   Assessment/plan status:  No Further Intervention Required Other assessment/ plan:   Information/referral to community  resources:   [facility] list for future reference if needed    PATIENT'S/FAMILY'S RESPONSE TO PLAN OF CARE: Pt alert and oriented x 4, but quiet during assessment and pt son communicated pt wishes. Pt son states that pt wishes are to return home and pt son feels confident that pt and pt son can manage at home. Pt son appreciative of visit and information regarding rehab at [facility] for future reference if needed.    Alison Murray, MSW, LCSW Clinical Social Work Coverage for [employer], [place]

## 2014-01-09 NOTE — Progress Notes (Signed)
OT Cancellation Note  Patient Details Name: Elizabeth Jarvis MRN: [id number] DOB: 29-Apr-1922   Cancelled Treatment:    Reason Eval/Treat Not Completed: OT unable to see, will see tomorrow 01/10/14  Angelene Giovanni Pranav Lince, OTR/L [phone]  01/09/2014, 12:30 PM

## 2014-01-09 NOTE — Progress Notes (Signed)
[facility] PROGRESS NOTE   Yarelin Reichardt ZOX:[id number] DOB: [birth year] DOA: 01/07/2014 PCP: No primary provider on file.  HPI/Subjective: Denies any complaints today, feels better.   Assessment/Plan: Principal Problem:   Hyponatremia Active Problems:   Renal failure   Edema of both legs   Weakness generalized   Urinary retention   Hydronephrosis, bilateral   Abdominal pain, lower   Ileus   Acute renal failure  Most likely is secondary to obstructive uropathy. Patient has bilateral hydronephrosis and very impressive urinary bladder dilatation. Presumed acute, no history in the medical records of renal dysfunction. After placement of the catheter patient has 6.9 L of urine output Renal function improving, presents with creatinine of 4.5   Hyponatremia  IVFs with NSS, hydrate  This is improved since increased  Hydronephrosis, bilateral  Secondary to massive urinary bladder dilatation, Foley catheter placed. If renal failure and hydronephrosis not improving will call urology.  Urinary retention  Unclear etiology, Foley cath placed. Monitor I/Os   Edema of both legs  Acute on chronic Edema likely secondary to acute renal failure. This is improved, back to her baseline  Weakness generalized  Due to #1, and #2  PT/OT ordered.  Abdominal pain, lower  This is secondary to abdominal urinary retention  Ileus  Per x-ray, patient actually is having multiple loose stools. Negative C. difficile. Will start her on Florastor.  Dermatitis  ? Resolved Zoster , Droplet PreCautions  Acyclovir Cream QID, keep the area dry if so it will not developed a secondary infection.    Code Status: Full code Family Communication: Plan discussed with the patient. Disposition Plan: Remains inpatient   Consultants:  None  Procedures:  On  Antibiotics:  None   Objective: Filed Vitals:   01/09/14 0700  BP: 131/68  Pulse: 79  Temp: 97.9 F (36.6 C)  Resp: 18     Intake/Output Summary (Last 24 hours) at 01/09/14 1252 Last data filed at 01/09/14 0600  Gross per 24 hour  Intake   1185 ml  Output   2100 ml  Net   -915 ml   Filed Weights   01/07/14 2208  Weight: 78.4 kg (172 lb 13.5 oz)    Exam: General: Alert and awake, oriented x3, not in any acute distress. HEENT: anicteric sclera, pupils reactive to light and accommodation, EOMI CVS: S1-S2 clear, no murmur rubs or gallops Chest: clear to auscultation bilaterally, no wheezing, rales or rhonchi Abdomen: soft nontender, nondistended, normal bowel sounds, no organomegaly Extremities: no cyanosis, clubbing or edema noted bilaterally Neuro: Cranial nerves II-XII intact, no focal neurological deficits  Data Reviewed: Basic Metabolic Panel:  Recent Labs Lab 01/07/14 1631 01/08/14 0403 01/09/14 0430  NA 120* 125* 131*  K 4.1 3.2* 3.9  CL 77* 84* 94*  CO2 GLUCOSE 96 91 101*  BUN 78* 71* 43*  CREATININE 4.55* 3.69* 1.88*  CALCIUM 9.0 8.2* 8.2*  MG  --   --  2.4  PHOS  --   --  2.4   Liver Function Tests:  Recent Labs Lab 01/07/14 1631 01/09/14 0430  AST 20  --   ALT 12  --   ALKPHOS 82  --   BILITOT 1.0  --   PROT 7.6  --   ALBUMIN 3.5 2.6*    Recent Labs Lab 01/07/14 1631  LIPASE 72*   No results found for this basename: AMMONIA,  in the last 168 hours CBC:  Recent Labs Lab 01/07/14 1631 01/08/14 0403  WBC 11.7* 8.0  NEUTROABS 8.6*  --   HGB 11.1* 9.5*  HCT 30.7* 26.1*  MCV 84.8 85.0  PLT 240 204   Cardiac Enzymes: No results found for this basename: CKTOTAL, CKMB, CKMBINDEX, TROPONINI,  in the last 168 hours BNP (last 3 results)  Recent Labs  01/07/14 1631  PROBNP 592.6*   CBG: No results found for this basename: GLUCAP,  in the last 168 hours  Micro Recent Results (from the past 240 hour(s))  URINE CULTURE     Status: None   Collection Time    01/07/14  5:50 PM      Result Value Ref Range Status   Specimen Description URINE,  CATHETERIZED   Final   Special Requests NONE   Final   Culture  Setup Time     Final   Value: 01/08/2014 00:09     Performed at [facility] Count     Final   Value: NO GROWTH     Performed at [facility]   Culture     Final   Value: NO GROWTH     Performed at [facility]   Report Status 01/08/2014 FINAL   Final  CLOSTRIDIUM DIFFICILE BY PCR     Status: None   Collection Time    01/08/14  1:34 AM      Result Value Ref Range Status   C difficile by pcr NEGATIVE  NEGATIVE Final   Comment: Performed at [facility]     Studies: Ct Abdomen Pelvis Wo Contrast  01/07/2014   CLINICAL DATA:  Bilateral leg swelling  EXAM: CT ABDOMEN AND PELVIS WITHOUT CONTRAST  TECHNIQUE: Multidetector CT imaging of the abdomen and pelvis was performed following the standard protocol without IV contrast.  COMPARISON:  None.  FINDINGS: Lung bases are free of acute infiltrate or sizable effusion.  The liver, gallbladder, spleen, adrenal glands and pancreas are within normal limits.  The kidneys are well visualized bilaterally and reveal no evidence of renal calculi. Fullness of the collecting systems is noted bilaterally. The bladder is significantly distended. No definitive calculi are seen. This raises suspicion for urinary retention and chronic hydronephrosis. The appendix is within normal limits. Mild diverticular change is seen without diverticulitis. The osseous structures show chronic compression deformities of L2 and T12. Degenerative changes of lumbar spine are noted as well.  IMPRESSION: Over dilatation of the bladder with associated hydronephrosis. No definitive obstructing lesions are seen and this is felt to be related to the over distension of the bladder.  No other acute abnormality is noted.   Electronically Signed   By: Alcide Clever M.D.   On: 01/07/2014 19:16   Dg Abd Acute W/chest  01/07/2014   CLINICAL DATA:  Right lower abdominal pain.  Swelling in both legs.   EXAM: ACUTE ABDOMEN SERIES (ABDOMEN 2 VIEW & CHEST 1 VIEW)  FINDINGS: Atherosclerotic calcifications are present in the aortic arch. Heart size normal. Mild interstitial coarsening is chronic.  Fluid levels are present within nondilated loops of small bowel and the right colon. There is no obstruction. There is no free air.  IMPRESSION: 1. Fluid levels within nondilated loops of large and small bowel suggesting an ileus. 2. No evidence for obstruction or free air. 3. Atherosclerosis. 4. Interstitial coarsening is likely chronic.   Electronically Signed   By: Gennette Pac M.D.   On: 01/07/2014 17:12    Scheduled Meds: . acyclovir ointment   Topical QID  .  aspirin EC  81 mg Oral Daily  . B-complex with vitamin C  1 tablet Oral Daily  . calcium-vitamin D  1 tablet Oral Q breakfast  . heparin subcutaneous  5,000 Units Subcutaneous 3 times per day  . pantoprazole  40 mg Oral Daily  . [START ON 01/10/2014] pneumococcal 23 valent vaccine  0.5 mL Intramuscular Tomorrow-1000  . polyethylene glycol  17 g Oral Daily  . sodium chloride  3 mL Intravenous Q12H   Continuous Infusions: . sodium chloride 75 mL/hr at 01/09/14 0644       Time spent: 35 minutes    [facility] A  [facility] Pager [phone] If 7PM-7AM, please contact night-coverage at [email or web address], password [facility] 01/09/2014, 12:52 PM  LOS: 2 days

## 2014-01-09 NOTE — Evaluation (Signed)
Physical Therapy Evaluation Patient Details Name: Elizabeth Jarvis MRN: [id number] DOB: 11-Nov-1921 Today's Date: 01/09/2014   History of Present Illness  Pt is a 80 year old female with hx of GERD and chronic LE edema admitted 01/07/14 with acute renal failure most likely is secondary to obstructive uropathy.  Clinical Impression  Pt currently with functional limitations due to the deficits listed below (see PT Problem List).  Pt will benefit from skilled PT to increase their independence and safety with mobility to allow discharge to the venue listed below.  Pt very agreeable to "physiotherapy" due to weakness and wishes to return to her baseline as soon as possible.  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  Pt lives with son but he works.  Recommend 24/7 assist at this time so pt would benefit from SNF.     Follow Up Recommendations SNF;Supervision/Assistance - 24 hour    Equipment Recommendations  None recommended by PT    Recommendations for Other Services       Precautions / Restrictions Precautions Precautions: Fall      Mobility  Bed Mobility Overal bed mobility: Needs Assistance Bed Mobility: Supine to Sit;Sit to Supine     Supine to sit: Min assist Sit to supine: Mod assist   General bed mobility comments: assist for trunk upright and LEs onto bed  Transfers Overall transfer level: Needs assistance Equipment used: Rolling walker (2 wheeled) Transfers: Sit to/from Stand Sit to Stand: Min guard         General transfer comment: verbal cues for hand placement  Ambulation/Gait Ambulation/Gait assistance: Min guard Ambulation Distance (Feet): 55 Feet Assistive device: Rolling walker (2 wheeled) Gait Pattern/deviations: Step-through pattern;Trunk flexed;Decreased stride length Gait velocity: decr   General Gait Details: pt ambulated around bed and back then needed seated rest break and performed around to other side of bed again for an approx total of 55  feet.  Stairs            Wheelchair Mobility    Modified Rankin (Stroke Patients Only)       Balance                                             Pertinent Vitals/Pain Pain Assessment: 0-10 Pain Score: 4  Pain Location: bil lower leg pain Pain Descriptors / Indicators: Sore Pain Intervention(s): Limited activity within patient's tolerance;Monitored during session;Repositioned    Home Living Family/patient expects to be discharged to:: Private residence Living Arrangements: Children (son)   Type of Home: House (condo) Home Access: Stairs to enter Entrance Stairs-Rails: None Secretary/administrator of Steps: 1 Home Layout: One level Home Equipment: Environmental consultant - 2 wheels      Prior Function Level of Independence: Independent               Hand Dominance        Extremity/Trunk Assessment               Lower Extremity Assessment: Generalized weakness         Communication   Communication: HOH  Cognition Arousal/Alertness: Awake/alert Behavior During Therapy: WFL for tasks assessed/performed Overall Cognitive Status: Within Functional Limits for tasks assessed                      General Comments      Exercises  Assessment/Plan    PT Assessment Patient needs continued PT services  PT Diagnosis Difficulty walking;Generalized weakness   PT Problem List Decreased strength;Decreased activity tolerance;Decreased mobility  PT Treatment Interventions Gait training;DME instruction;Functional mobility training;Therapeutic activities;Therapeutic exercise;Patient/family education   PT Goals (Current goals can be found in the Care Plan section) Acute Rehab PT Goals PT Goal Formulation: With patient Time For Goal Achievement: 01/16/14 Potential to Achieve Goals: Good    Frequency Min 3X/week   Barriers to discharge        Co-evaluation               End of Session Equipment Utilized During Treatment:  Gait belt Activity Tolerance: Patient limited by fatigue Patient left: in bed;with call bell/phone within reach;with bed alarm set           Time: 1350-1425 PT Time Calculation (min): 35 min   Charges:   PT Evaluation $Initial PT Evaluation Tier I: 1 Procedure PT Treatments $Gait Training: 23-37 mins   PT G Codes:          Maley Venezia,KATHrine E 01/09/2014, 2:42 PM Zenovia Jarred, PT, DPT 01/09/2014 Pager: [phone]

## 2014-01-10 DIAGNOSIS — N179 Acute kidney failure, unspecified: Secondary | ICD-10-CM | POA: Diagnosis not present

## 2014-01-10 LAB — RENAL FUNCTION PANEL
Albumin: 2.8 g/dL — ABNORMAL LOW (ref 3.5–5.2)
Anion gap: 11 (ref 5–15)
BUN: 24 mg/dL — ABNORMAL HIGH (ref 6–23)
CO2: 24 meq/L (ref 19–32)
CREATININE: 1.23 mg/dL — AB (ref 0.50–1.10)
Calcium: 8.4 mg/dL (ref 8.4–10.5)
Chloride: 97 mEq/L (ref 96–112)
GFR calc Af Amer: 43 mL/min — ABNORMAL LOW (ref >90)
GFR calc non Af Amer: 37 mL/min — ABNORMAL LOW (ref >90)
GLUCOSE: 99 mg/dL (ref 70–99)
Phosphorus: 1.9 mg/dL — ABNORMAL LOW (ref 2.3–4.6)
Potassium: 4.8 mEq/L (ref 3.7–5.3)
Sodium: 132 mEq/L — ABNORMAL LOW (ref 137–147)

## 2014-01-10 MED ORDER — K PHOS MONO-SOD PHOS DI & MONO 155-852-130 MG PO TABS
500.0000 mg | ORAL_TABLET | Freq: Three times a day (TID) | ORAL | Status: DC
  Administered 2014-01-10 – 2014-01-11 (×4): 500 mg via ORAL
  Filled 2014-01-10 (×6): qty 2

## 2014-01-10 NOTE — Evaluation (Signed)
Occupational Therapy Evaluation Patient Details Name: Elizabeth Jarvis MRN: [id number] DOB: 10-May-1921 Today's Date: 01/10/2014    History of Present Illness Pt is a 80 year old female with hx of GERD and chronic LE edema admitted 01/07/14 with acute renal failure most likely is secondary to obstructive uropathy.   Clinical Impression   Pt up to chair with min assist and multimodal cues for safety. She tends to let go of walker prematurely to sit in chair. Appears pt and family desire d/c home. Feel she could benefit from SNF but if pt d/c home, recommend HHOT, 24/7 assist and [facility] aide. Will follow.    Follow Up Recommendations  SNF;Other (comment) (appears pt and family desire home. So recommend 24/7 and HHOT and aide if home)    Equipment Recommendations  None recommended by OT    Recommendations for Other Services       Precautions / Restrictions Precautions Precautions: Fall      Mobility Bed Mobility Overal bed mobility: Needs Assistance Bed Mobility: Supine to Sit     Supine to sit: Min guard     General bed mobility comments: increased time  Transfers Overall transfer level: Needs assistance Equipment used: Rolling walker (2 wheeled) Transfers: Sit to/from Stand Sit to Stand: Min assist         General transfer comment: verbal cues for hand placement    Balance                                            ADL Overall ADL's : Needs assistance/impaired Eating/Feeding: Independent;Sitting   Grooming: Wash/dry hands;Set up;Sitting   Upper Body Bathing: Supervision/ safety;Set up;Sitting   Lower Body Bathing: Moderate assistance;Sit to/from stand   Upper Body Dressing : Set up;Supervision/safety;Sitting   Lower Body Dressing: Maximal assistance;Sit to/from stand Lower Body Dressing Details (indicate cue type and reason): unable to cross LEs to don socks or get down to her feet currently. Toilet Transfer: Minimal assistance;Stand-pivot;RW    Toileting- Clothing Manipulation and Hygiene: Moderate assistance;Sit to/from stand         General ADL Comments: Pt reports it took her a long time to complete ADL at home. She needs multimodal cues to safely use walker to step around to the chair. She attempted to let go of walker prematurely before fully backed up to chair.      Vision                     Perception     Praxis      Pertinent Vitals/Pain Pain Assessment: Faces Faces Pain Scale: Hurts a little bit Pain Location: bilateral LE  Pain Descriptors / Indicators: Sore Pain Intervention(s): Repositioned     Hand Dominance     Extremity/Trunk Assessment Upper Extremity Assessment Upper Extremity Assessment: Generalized weakness           Communication Communication Communication: HOH   Cognition Arousal/Alertness: Awake/alert Behavior During Therapy: WFL for tasks assessed/performed Overall Cognitive Status: Within Functional Limits for tasks assessed                     General Comments       Exercises       Shoulder Instructions      Home Living Family/patient expects to be discharged to:: Private residence Living Arrangements: Children (son) Available Help at Discharge: Available PRN/intermittently (  pt states son works) Type of Home: House (condo) Home Access: Stairs to enter Secretary/administrator of Steps: 1 Entrance Stairs-Rails: None Home Layout: One level     Bathroom Shower/Tub: Chief Strategy Officer: Standard     Home Equipment: Environmental consultant - 2 wheels;Toilet riser;Walker - 4 wheels;Shower seat   Additional Comments: pt states it takes her a long time to complete tasks. She sits on shower seat for bathing. She has a 4 wheel walker to sit when she gets tired.      Prior Functioning/Environment Level of Independence: Independent             OT Diagnosis: Generalized weakness   OT Problem List: Decreased strength;Decreased knowledge of use of DME  or AE   OT Treatment/Interventions: Self-care/ADL training;Patient/family education;Therapeutic activities;DME and/or AE instruction    OT Goals(Current goals can be found in the care plan section) Acute Rehab OT Goals Patient Stated Goal: wants to go home OT Goal Formulation: With patient Time For Goal Achievement: 01/24/14 Potential to Achieve Goals: Good  OT Frequency: Min 2X/week   Barriers to D/C:            Co-evaluation              End of Session Equipment Utilized During Treatment: Rolling walker  Activity Tolerance: Patient tolerated treatment well Patient left: in chair;with call bell/phone within reach   Time: 1120-1145 OT Time Calculation (min): 25 min Charges:  OT General Charges $OT Visit: 1 Procedure OT Evaluation $Initial OT Evaluation Tier I: 1 Procedure OT Treatments $Therapeutic Activity: 8-22 mins G-Codes:    Lennox Laity [phone] 01/10/2014, 11:59 AM

## 2014-01-10 NOTE — Progress Notes (Signed)
[facility] PROGRESS NOTE   Elizabeth Jarvis UJW:[id number] DOB: 06/28/1921 DOA: 01/07/2014 PCP: No primary provider on file.  HPI/Subjective: Patient does not have any complaints, nursing staff mentioned that she is having some blood oozing from her buttock. Patient examined, after the dressing removed the the sloughed skin area is oozing blood.  Assessment/Plan: Principal Problem:   Hyponatremia Active Problems:   Renal failure   Edema of both legs   Weakness generalized   Urinary retention   Hydronephrosis, bilateral   Abdominal pain, lower   Ileus   Acute renal failure  Most likely is secondary to obstructive uropathy. Patient has bilateral hydronephrosis and very impressive urinary bladder dilatation. Presumed acute, no history in the medical records of renal dysfunction. After placement of the catheter patient has 6.9 L of urine output Renal function improving, presents with creatinine of 4.5   Hyponatremia  IVFs with NSS, hydrate  This is improved since increased  Hydronephrosis, bilateral  Secondary to massive urinary bladder dilatation, Foley catheter placed. If renal failure and hydronephrosis not improving will call urology.  Urinary retention  Unclear etiology, Foley cath placed. Monitor I/Os   Edema of both legs  Acute on chronic Edema likely secondary to acute renal failure. This is improved, back to her baseline  Weakness generalized  Due to #1, and #2  PT/OT ordered.  Abdominal pain, lower  This is secondary to abdominal urinary retention  Ileus  Per x-ray, patient actually is having multiple loose stools. Negative C. difficile. Will start her on Florastor.  Dermatitis  ? Resolved Zoster , Droplet PreCautions  Acyclovir Cream QID, keep the area dry if so it will not developed a secondary infection. Not sure this is zoster, I will ask WOC team to help with care of the sloughed skin area.    Code Status: Full code Family Communication:  Plan discussed with the patient. Disposition Plan: Remains inpatient   Consultants:  None  Procedures:  On  Antibiotics:  None   Objective: Filed Vitals:   01/10/14 0620  BP: 153/73  Pulse: 88  Temp: 98.1 F (36.7 C)  Resp: 18    Intake/Output Summary (Last 24 hours) at 01/10/14 1222 Last data filed at 01/10/14 1001  Gross per 24 hour  Intake   2460 ml  Output   3625 ml  Net  -1165 ml   Filed Weights   01/07/14 2208  Weight: 78.4 kg (172 lb 13.5 oz)    Exam: General: Alert and awake, oriented x3, not in any acute distress. HEENT: anicteric sclera, pupils reactive to light and accommodation, EOMI CVS: S1-S2 clear, no murmur rubs or gallops Chest: clear to auscultation bilaterally, no wheezing, rales or rhonchi Abdomen: soft nontender, nondistended, normal bowel sounds, no organomegaly Extremities: no cyanosis, clubbing or edema noted bilaterally Neuro: Cranial nerves II-XII intact, no focal neurological deficits  Data Reviewed: Basic Metabolic Panel:  Recent Labs Lab 01/07/14 1631 01/08/14 0403 01/09/14 0430 01/10/14 0443  NA 120* 125* 131* 132*  K 4.1 3.2* 3.9 4.8  CL 77* 84* 94* 97  CO2 GLUCOSE 96 91 101* 99  BUN 78* 71* 43* 24*  CREATININE 4.55* 3.69* 1.88* 1.23*  CALCIUM 9.0 8.2* 8.2* 8.4  MG  --   --  2.4  --   PHOS  --   --  2.4 1.9*   Liver Function Tests:  Recent Labs Lab 01/07/14 1631 01/09/14 0430 01/10/14 0443  AST 20  --   --  ALT 12  --   --   ALKPHOS 82  --   --   BILITOT 1.0  --   --   PROT 7.6  --   --   ALBUMIN 3.5 2.6* 2.8*    Recent Labs Lab 01/07/14 1631  LIPASE 72*   No results found for this basename: AMMONIA,  in the last 168 hours CBC:  Recent Labs Lab 01/07/14 1631 01/08/14 0403  WBC 11.7* 8.0  NEUTROABS 8.6*  --   HGB 11.1* 9.5*  HCT 30.7* 26.1*  MCV 84.8 85.0  PLT 240 204   Cardiac Enzymes: No results found for this basename: CKTOTAL, CKMB, CKMBINDEX, TROPONINI,  in the last  168 hours BNP (last 3 results)  Recent Labs  01/07/14 1631  PROBNP 592.6*   CBG: No results found for this basename: GLUCAP,  in the last 168 hours  Micro Recent Results (from the past 240 hour(s))  URINE CULTURE     Status: None   Collection Time    01/07/14  5:50 PM      Result Value Ref Range Status   Specimen Description URINE, CATHETERIZED   Final   Special Requests NONE   Final   Culture  Setup Time     Final   Value: 01/08/2014 00:09     Performed at [facility] Count     Final   Value: NO GROWTH     Performed at [facility]   Culture     Final   Value: NO GROWTH     Performed at [facility]   Report Status 01/08/2014 FINAL   Final  CLOSTRIDIUM DIFFICILE BY PCR     Status: None   Collection Time    01/08/14  1:34 AM      Result Value Ref Range Status   C difficile by pcr NEGATIVE  NEGATIVE Final   Comment: Performed at [facility]     Studies: No results found.  Scheduled Meds: . acyclovir ointment   Topical QID  . aspirin EC  81 mg Oral Daily  . B-complex with vitamin C  1 tablet Oral Daily  . calcium-vitamin D  1 tablet Oral Q breakfast  . pantoprazole  40 mg Oral Daily  . phosphorus  500 mg Oral TID  . saccharomyces boulardii  250 mg Oral BID  . sodium chloride  3 mL Intravenous Q12H   Continuous Infusions: . sodium chloride 75 mL/hr at 01/10/14 1001       Time spent: 35 minutes    [facility] A  [facility] Pager [phone] If 7PM-7AM, please contact night-coverage at [email or web address], password [facility] 01/10/2014, 12:22 PM  LOS: 3 days

## 2014-01-11 LAB — RENAL FUNCTION PANEL
ANION GAP: 12 (ref 5–15)
Albumin: 2.8 g/dL — ABNORMAL LOW (ref 3.5–5.2)
BUN: 14 mg/dL (ref 6–23)
CO2: 24 meq/L (ref 19–32)
Calcium: 8.7 mg/dL (ref 8.4–10.5)
Chloride: 96 mEq/L (ref 96–112)
Creatinine, Ser: 1.12 mg/dL — ABNORMAL HIGH (ref 0.50–1.10)
GFR calc non Af Amer: 42 mL/min — ABNORMAL LOW (ref >90)
GFR, EST AFRICAN AMERICAN: 49 mL/min — AB (ref >90)
GLUCOSE: 94 mg/dL (ref 70–99)
PHOSPHORUS: 3.8 mg/dL (ref 2.3–4.6)
POTASSIUM: 4.2 meq/L (ref 3.7–5.3)
SODIUM: 132 meq/L — AB (ref 137–147)

## 2014-01-11 LAB — MAGNESIUM: Magnesium: 1.5 mg/dL (ref 1.5–2.5)

## 2014-01-11 MED ORDER — ZINC OXIDE 40 % EX PSTE: PASTE | CUTANEOUS | Status: DC

## 2014-01-11 MED ORDER — FLUOXETINE HCL 10 MG PO CAPS: 10.0000 mg | ORAL_CAPSULE | Freq: Every day | ORAL | Status: DC

## 2014-01-11 NOTE — Progress Notes (Signed)
01/11/14  1520  Reviewed discharge instructions with patients son.  Son verbalized understanding of discharge instructions.  Copy of discharge papers and prescriptions given to patient's son.  Pt was discharged with a bedside toilet, 2 tubes of Zinc cream, and cushion for chair.

## 2014-01-11 NOTE — Progress Notes (Addendum)
CARE MANAGEMENT NOTE 01/11/2014  Patient:  Elizabeth Jarvis, Elizabeth Jarvis   Account Number:  [id number]  Date Initiated:  01/09/2014  Documentation initiated by:  Ezekiel Ina  Subjective/Objective Assessment:   pt admitted with cco hyponatremia     Action/Plan:   from home   Anticipated DC Date:  01/10/2014   Anticipated DC Plan:  HOME W HOME HEALTH SERVICES      DC Planning Services  CM consult      [facility] Choice  HOME HEALTH   Choice offered to / List presented to:  C-4 Adult Children   DME arranged  3-N-1      DME agency  [facility]     [facility] arranged  HH-1 RN  HH-2 PT  HH-4 NURSE'S AIDE      HH agency  [facility]   Status of service:  Completed, signed off Medicare Important Message given?  YES (If response is "NO", the following Medicare IM given date fields will be blank) Date Medicare IM given:  01/09/2014 Medicare IM given by:  Ezekiel Ina Date Additional Medicare IM given:  01/11/2014 Additional Medicare IM given by:  Isidoro Donning  Discharge Disposition:  HOME [facility] SERVICES  Per UR Regulation:  Reviewed for med. necessity/level of care/duration of stay  If discussed at Long Length of Stay Meetings, dates discussed:    Comments:  01/11/2014 1215 NCM spoke to son and requesting 3n1 for home. Notified [facility] for 3n1 for home. Isidoro Donning RN CCM Case Mgmt [phone]  01/09/14 MMcGibboney, RN, BSN Spoke with pt and son, [facility] was selected for [facility]. [facility] for PCP.  Information, given to son. [facility] is closed at present time.

## 2014-01-11 NOTE — Progress Notes (Signed)
01/11/2014 1420 Notified [facility] of scheduled dc home today with HH. Isidoro Donning RN CCM Case Mgmt phone [phone]

## 2014-01-11 NOTE — Consult Note (Signed)
WOC wound consult note Reason for Consult: Right buttock full thickness wound with two small dried lesions at coccyx.  Suspected herpes zoster. Wound type:infectious vs moisture associated skin damage Pressure Ulcer POA: No Measurement:10 cm x 9cm x 0.2cm area of full thickness tissue loss involving right buttock, scattered areas are bleeding at the time of my assessment.  Topical acyclovir is currently ordered.  See below for my recommendations on antiviral therapy. Wound bed:As described above. Drainage (amount, consistency, odor) Serosanguinous exudate and scattered areas of frank bleeding noted. Periwound: intact Dressing procedure/placement/frequency: This presentation is not completely consistent with viral (zoster) ulceration, but the pain, unilateral presentation and singular ulcers do mimic the more classic symptoms.  I do not feel that the topical antiviral is helpful at this point and think that if antiviral therapy is desired that we should change to systemic (oral or infusion). Topical therapy will focus on covering the tissue to quiet nerve ending stimulation and subsequent pain.  The silicone dressings may be appropriate here in acute care, but are not likely to be accessible at home and can cause some superficial trauma if removed too often.  Considering that, I will provide orders for topical care using our house, zinc-based protective barrier cream and arrange to have two tubes sent home with patient upon discharge. This will provide coverage and will also dry the exposed tissue. I will provide a pressure redistribution chair cushion for her use at home when OOB in chair to address discomfort.  If no improvement, suggest PCP consult with Dermatology MD at time of follow-up as the recommendations made above reflect the limit of my scope of practice. WOC nursing team will not follow, but will remain available to this patient, the nursing and medical teams.  Thanks, Ladona Mow, MSN,  RN, GNP, [place], CWON-AP [phone])

## 2014-01-11 NOTE — Progress Notes (Signed)
[facility] PROGRESS NOTE   Elizabeth Jarvis ZOX:[id number] DOB: [birth year] DOA: 01/07/2014 PCP: No primary provider on file.  HPI/Subjective: Feels much better, still losing some blood from right buttock wound area. Await WOC team recommendation.  Assessment/Plan: Principal Problem:   Hyponatremia Active Problems:   Renal failure   Edema of both legs   Weakness generalized   Urinary retention   Hydronephrosis, bilateral   Abdominal pain, lower   Ileus   Acute renal failure  Most likely is secondary to obstructive uropathy. Patient has bilateral hydronephrosis and very impressive urinary bladder dilatation. Presumed acute, no history in the medical records of renal dysfunction. After placement of the catheter patient has 6.9 L of urine output Renal function improving, presents with creatinine of 4.5  Creatinine has returned to normal today at 1.12.  Hyponatremia  IVFs with NSS, hydrate  This is improved since increased  Hydronephrosis, bilateral  Secondary to massive urinary bladder dilatation, Foley catheter placed. Discontinue Foley catheter today, start voiding trials.  Urinary retention  Unclear etiology, Foley cath placed. Monitor I/Os   Edema of both legs  Acute on chronic Edema likely secondary to acute renal failure. This is improved, back to her baseline  Weakness generalized  Due to #1, and #2  PT/OT ordered.  Abdominal pain, lower  This is secondary to abdominal urinary retention  Ileus  Per x-ray, patient actually is having multiple loose stools. Negative C. difficile. Will start her on Florastor.  Dermatitis  ? Resolved Zoster , Droplet PreCautions  Acyclovir Cream QID, keep the area dry if so it will not developed a secondary infection. Not sure this is zoster, I will ask WOC team to help with care of the sloughed skin area.    Code Status: Full code Family Communication: Plan discussed with the patient. Disposition Plan: Remains  inpatient   Consultants:  None  Procedures:  On  Antibiotics:  None   Objective: Filed Vitals:   01/11/14 0550  BP: 149/66  Pulse: 88  Temp: 98.6 F (37 C)  Resp: 16    Intake/Output Summary (Last 24 hours) at 01/11/14 1203 Last data filed at 01/11/14 0500  Gross per 24 hour  Intake    840 ml  Output   1125 ml  Net   -285 ml   Filed Weights   01/07/14 2208  Weight: 78.4 kg (172 lb 13.5 oz)    Exam: General: Alert and awake, oriented x3, not in any acute distress. HEENT: anicteric sclera, pupils reactive to light and accommodation, EOMI CVS: S1-S2 clear, no murmur rubs or gallops Chest: clear to auscultation bilaterally, no wheezing, rales or rhonchi Abdomen: soft nontender, nondistended, normal bowel sounds, no organomegaly Extremities: no cyanosis, clubbing or edema noted bilaterally Neuro: Cranial nerves II-XII intact, no focal neurological deficits  Data Reviewed: Basic Metabolic Panel:  Recent Labs Lab 01/07/14 1631 01/08/14 0403 01/09/14 0430 01/10/14 0443 01/11/14 0534 01/11/14 0551  NA 120* 125* 131* 132* 132*  --   K 4.1 3.2* 3.9 4.8 4.2  --   CL 77* 84* 94* 97 96  --   CO2 --   GLUCOSE 96 91 101* 99 94  --   BUN 78* 71* 43* 24* 14  --   CREATININE 4.55* 3.69* 1.88* 1.23* 1.12*  --   CALCIUM 9.0 8.2* 8.2* 8.4 8.7  --   MG  --   --  2.4  --   --  1.5  PHOS  --   --  2.4 1.9* 3.8  --    Liver Function Tests:  Recent Labs Lab 01/07/14 1631 01/09/14 0430 01/10/14 0443 01/11/14 0534  AST 20  --   --   --   ALT 12  --   --   --   ALKPHOS 82  --   --   --   BILITOT 1.0  --   --   --   PROT 7.6  --   --   --   ALBUMIN 3.5 2.6* 2.8* 2.8*    Recent Labs Lab 01/07/14 1631  LIPASE 72*   No results found for this basename: AMMONIA,  in the last 168 hours CBC:  Recent Labs Lab 01/07/14 1631 01/08/14 0403  WBC 11.7* 8.0  NEUTROABS 8.6*  --   HGB 11.1* 9.5*  HCT 30.7* 26.1*  MCV 84.8 85.0  PLT 240 204    Cardiac Enzymes: No results found for this basename: CKTOTAL, CKMB, CKMBINDEX, TROPONINI,  in the last 168 hours BNP (last 3 results)  Recent Labs  01/07/14 1631  PROBNP 592.6*   CBG: No results found for this basename: GLUCAP,  in the last 168 hours  Micro Recent Results (from the past 240 hour(s))  URINE CULTURE     Status: None   Collection Time    01/07/14  5:50 PM      Result Value Ref Range Status   Specimen Description URINE, CATHETERIZED   Final   Special Requests NONE   Final   Culture  Setup Time     Final   Value: 01/08/2014 00:09     Performed at [facility] Count     Final   Value: NO GROWTH     Performed at [facility]   Culture     Final   Value: NO GROWTH     Performed at [facility]   Report Status 01/08/2014 FINAL   Final  CLOSTRIDIUM DIFFICILE BY PCR     Status: None   Collection Time    01/08/14  1:34 AM      Result Value Ref Range Status   C difficile by pcr NEGATIVE  NEGATIVE Final   Comment: Performed at [facility]     Studies: No results found.  Scheduled Meds: . acyclovir ointment   Topical QID  . aspirin EC  81 mg Oral Daily  . B-complex with vitamin C  1 tablet Oral Daily  . calcium-vitamin D  1 tablet Oral Q breakfast  . pantoprazole  40 mg Oral Daily  . phosphorus  500 mg Oral TID  . saccharomyces boulardii  250 mg Oral BID  . sodium chloride  3 mL Intravenous Q12H   Continuous Infusions:       Time spent: 35 minutes    [facility] A  [facility] Pager [phone] If 7PM-7AM, please contact night-coverage at [email or web address], password [facility] 01/11/2014, 12:03 PM  LOS: 4 days

## 2014-01-11 NOTE — Discharge Summary (Signed)
Physician Discharge Summary  Elizabeth Jarvis ZOX:[id number] DOB: 09-15-1921 DOA: 01/07/2014  PCP: No primary provider on file.  Admit date: 01/07/2014 Discharge date: 01/11/2014  Time spent:40 minutes  Recommendations for Outpatient Follow-up:  1. Patient recently moved [place], does not have a primary care physician. 2. Discussed with the son and said he will find primary care physician in the next week. 3. Followup right buttock ulcer/dermatitis closely, if no improvement needs referral to dermatology versus wound clinic  Discharge Diagnoses:  Principal Problem:   Hyponatremia Active Problems:   Renal failure   Edema of both legs   Weakness generalized   Urinary retention   Hydronephrosis, bilateral   Abdominal pain, lower   Ileus   Discharge Condition: Stable  Diet recommendation: Heart healthy  Filed Weights   01/07/14 2208  Weight: 78.4 kg (172 lb 13.5 oz)    History of present illness:  Elizabeth Jarvis is a 80 y.o. female with a history of GERD, and Chronic Lower Extremity Edema who was brought to the ED by her son due to complaints of worsening weakness and confusion over the past week, along with complaitnts of nausea and lower ABD pain and decreased urination Over the past 3-4 days. She has not had any fever or chills. She also has not had any vomiting or diarrhea. She was having increased difficulty walking due to worsening of her lower leg edema, of which her son reports that both of her legs are 20% more swollen than her usual. When her son had to help her to the bathroom today and he noticed that she had a rash on her right buttock area, and they both are not aware of how long the rash has been there. She denies pain to the rash but reports some itching an discomfort.  In the ED, she was found to have Hyponatremia at a level of 120, and a BUN/Cr of 78/4.55. A KUB and a Ct Scan of the ABD pelvis was performed which revealed an Ileus, and Bilateral Hydronephrosis respectively.  Venous Duplex US Studies were performed of both lower legs and were found to be negative for DVTs.   Hospital Course:   Per son her story started about 2-3 weeks ago he had a business trip and she had to go with him because she does not want to stay alone at home, after which she developed constipation, soon after that patient started to have urinary retention as well. She started to have diarrhea afterwards, developed right buttock skin ulceration and generalized weakness. Patient presented to the hospital with massive urinary retention.  Acute renal failure  Most likely is secondary to obstructive uropathy, patient presented to the hospital his creatinine of 4.5.Marland Kitchen  Patient has bilateral hydronephrosis and very impressive urinary bladder dilatation.  Presumed acute, no history in the medical records of renal dysfunction.  After placement of the catheter patient has 6.9 L of urine output  Creatinine retained normal at 1.12 the day of discharge. Communicate with the son that he should monitor her many times she goes to the bathroom alert her PCP if she started to have retention again.  Hyponatremia  IVFs with NSS, hydrate  This is improved since IV fluids started, sodium today is 132, she was presented with swelling of 120.  Hydronephrosis, bilateral  Secondary to massive urinary bladder dilatation, Foley catheter placed.  She might need ultrasound as outpatient to ensure resolution of the hydronephrosis.  Urinary retention  Unclear etiology, Foley cath placed.  Could be secondary  to prolonged sitting along with constipation Foley catheter removed earlier today patient was able to urinate without difficulty.  Edema of both legs  Acute on chronic Edema likely secondary to acute renal failure.  This is improved, back to her baseline   Weakness generalized  Due to #1, and #2  PT/OT and recommended home health services.  Abdominal pain, lower  This is secondary to abdominal urinary  retention   Ileus  Per x-ray, patient actually is having multiple loose stools. Negative C. difficile.  Diarrhea after constipation, sounds like overflow incontinence. Had multiple BMs in the hospital. Advised son to provide plenty of water and fiber supplements to help with.  Dermatitis  ? Resolved Zoster , Droplet PreCautions  Initially patient was placed on acyclovir cream, and the area started to ooze some blood.  This is thought to be secondary to subcutaneous heparin used for DVT prophylaxis. Wound and ostomy care nurse evaluated the patient recommended skin barrier, Desitin cream ordered. Initially was thought to be zoster, very atypical for zoster, used topical cream, discontinue topical cream. Oral medications is not indicated as there is no vesicles/blisters, and personally I do not think this is herpes zoster. Needs close followup, and this is continued to be a problem she might need a referral to dermatologist.     Procedures:  None  Consultations:  None  Discharge Exam: Filed Vitals:   01/11/14 1304  BP: 155/66  Pulse: 99  Temp: 98.7 F (37.1 C)  Resp: 20   General: Alert and awake, oriented x3, not in any acute distress. HEENT: anicteric sclera, pupils reactive to light and accommodation, EOMI CVS: S1-S2 clear, no murmur rubs or gallops Chest: clear to auscultation bilaterally, no wheezing, rales or rhonchi Abdomen: soft nontender, nondistended, normal bowel sounds, no organomegaly Extremities: no cyanosis, has big legs bilaterally but no evidence of edema Neuro: Cranial nerves II-XII intact, no focal neurological deficits  Discharge Instructions You were cared for by a hospitalist during your hospital stay. If you have any questions about your discharge medications or the care you received while you were in the hospital after you are discharged, you can call the unit and asked to speak with the hospitalist on call if the hospitalist that took care of you is  not available. Once you are discharged, your primary care physician will handle any further medical issues. Please note that NO REFILLS for any discharge medications will be authorized once you are discharged, as it is imperative that you return to your primary care physician (or establish a relationship with a primary care physician if you do not have one) for your aftercare needs so that they can reassess your need for medications and monitor your lab values.   Current Discharge Medication List    START taking these medications   Details  FLUoxetine (PROZAC) 10 MG capsule Take 1 capsule (10 mg total) by mouth daily. Qty: 30 capsule, Refills: 0    Zinc Oxide (DESITIN MAXIMUM STRENGTH) 40 % PSTE Apply to affected area 3 times a day Qty: 28 g, Refills: 2      CONTINUE these medications which have NOT CHANGED   Details  aspirin EC 81 MG tablet Take 81 mg by mouth daily.    b complex vitamins tablet Take 1 tablet by mouth daily.    calcium-vitamin D (OSCAL WITH D) 500-200 MG-UNIT per tablet Take 1 tablet by mouth daily with breakfast.    docusate sodium (COLACE) 100 MG capsule Take 100 mg by  mouth daily as needed for mild constipation.    furosemide (LASIX) 20 MG tablet Take 20 mg by mouth daily as needed for fluid.    omeprazole (PRILOSEC) 20 MG capsule Take 20 mg by mouth daily.    tetrahydrozoline (VISINE) 0.05 % ophthalmic solution Place 1 drop into both eyes 2 (two) times daily.      STOP taking these medications     Flaxseed, Linseed, (FLAX SEEDS) POWD        No Known Allergies Follow-up Information   Follow up with Jeanann Lewandowsky, MD. (Please call to make an appointment.)    Specialty:  Internal Medicine   Contact information:   [address] [place] [address] [phone]       Follow up with [facility] RN and Physical Therapy)    Contact information:   [address] [place] [address] [phone]         The results of significant diagnostics from this hospitalization (including imaging, microbiology, ancillary and laboratory) are listed below for reference.    Significant Diagnostic Studies: Ct Abdomen Pelvis Wo Contrast  01/07/2014   CLINICAL DATA:  Bilateral leg swelling  EXAM: CT ABDOMEN AND PELVIS WITHOUT CONTRAST  TECHNIQUE: Multidetector CT imaging of the abdomen and pelvis was performed following the standard protocol without IV contrast.  COMPARISON:  None.  FINDINGS: Lung bases are free of acute infiltrate or sizable effusion.  The liver, gallbladder, spleen, adrenal glands and pancreas are within normal limits.  The kidneys are well visualized bilaterally and reveal no evidence of renal calculi. Fullness of the collecting systems is noted bilaterally. The bladder is significantly distended. No definitive calculi are seen. This raises suspicion for urinary retention and chronic hydronephrosis. The appendix is within normal limits. Mild diverticular change is seen without diverticulitis. The osseous structures show chronic compression deformities of L2 and T12. Degenerative changes of lumbar spine are noted as well.  IMPRESSION: Over dilatation of the bladder with associated hydronephrosis. No definitive obstructing lesions are seen and this is felt to be related to the over distension of the bladder.  No other acute abnormality is noted.   Electronically Signed   By: Alcide Clever M.D.   On: 01/07/2014 19:16   Dg Abd Acute W/chest  01/07/2014   CLINICAL DATA:  Right lower abdominal pain.  Swelling in both legs.  EXAM: ACUTE ABDOMEN SERIES (ABDOMEN 2 VIEW & CHEST 1 VIEW)  FINDINGS: Atherosclerotic calcifications are present in the aortic arch. Heart size normal. Mild interstitial coarsening is chronic.  Fluid levels are present within nondilated loops of small bowel and the right colon. There is no obstruction. There is no free air.  IMPRESSION: 1. Fluid levels within nondilated loops of large  and small bowel suggesting an ileus. 2. No evidence for obstruction or free air. 3. Atherosclerosis. 4. Interstitial coarsening is likely chronic.   Electronically Signed   By: Gennette Pac M.D.   On: 01/07/2014 17:12    Microbiology: Recent Results (from the past 240 hour(s))  URINE CULTURE     Status: None   Collection Time    01/07/14  5:50 PM      Result Value Ref Range Status   Specimen Description URINE, CATHETERIZED   Final   Special Requests NONE   Final   Culture  Setup Time     Final   Value: 01/08/2014 00:09     Performed at [facility]  Colony Count     Final   Value: NO GROWTH     Performed at [facility]   Culture     Final   Value: NO GROWTH     Performed at [facility]   Report Status 01/08/2014 FINAL   Final  CLOSTRIDIUM DIFFICILE BY PCR     Status: None   Collection Time    01/08/14  1:34 AM      Result Value Ref Range Status   C difficile by pcr NEGATIVE  NEGATIVE Final   Comment: Performed at [facility]     Labs: Basic Metabolic Panel:  Recent Labs Lab 01/07/14 1631 01/08/14 0403 01/09/14 0430 01/10/14 0443 01/11/14 0534 01/11/14 0551  NA 120* 125* 131* 132* 132*  --   K 4.1 3.2* 3.9 4.8 4.2  --   CL 77* 84* 94* 97 96  --   CO2 --   GLUCOSE 96 91 101* 99 94  --   BUN 78* 71* 43* 24* 14  --   CREATININE 4.55* 3.69* 1.88* 1.23* 1.12*  --   CALCIUM 9.0 8.2* 8.2* 8.4 8.7  --   MG  --   --  2.4  --   --  1.5  PHOS  --   --  2.4 1.9* 3.8  --    Liver Function Tests:  Recent Labs Lab 01/07/14 1631 01/09/14 0430 01/10/14 0443 01/11/14 0534  AST 20  --   --   --   ALT 12  --   --   --   ALKPHOS 82  --   --   --   BILITOT 1.0  --   --   --   PROT 7.6  --   --   --   ALBUMIN 3.5 2.6* 2.8* 2.8*    Recent Labs Lab 01/07/14 1631  LIPASE 72*   No results found for this basename: AMMONIA,  in the last 168 hours CBC:  Recent Labs Lab 01/07/14 1631 01/08/14 0403  WBC 11.7* 8.0   NEUTROABS 8.6*  --   HGB 11.1* 9.5*  HCT 30.7* 26.1*  MCV 84.8 85.0  PLT 240 204   Cardiac Enzymes: No results found for this basename: CKTOTAL, CKMB, CKMBINDEX, TROPONINI,  in the last 168 hours BNP: BNP (last 3 results)  Recent Labs  01/07/14 1631  PROBNP 592.6*   CBG: No results found for this basename: GLUCAP,  in the last 168 hours     Signed:  Yuvraj Pfeifer A  [facility] 01/11/2014, 2:10 PM

## 2014-01-13 ENCOUNTER — Encounter (HOSPITAL_COMMUNITY): Payer: Self-pay | Admitting: Emergency Medicine

## 2014-01-13 ENCOUNTER — Inpatient Hospital Stay (HOSPITAL_COMMUNITY)
Admission: EM | Admit: 2014-01-13 | Discharge: 2014-01-16 | DRG: 683 | Disposition: A | Discharge status: Home Health | Payer: Medicare Other | Attending: Family Medicine | Admitting: Family Medicine

## 2014-01-13 DIAGNOSIS — N1330 Unspecified hydronephrosis: Secondary | ICD-10-CM | POA: Diagnosis present

## 2014-01-13 DIAGNOSIS — E871 Hypo-osmolality and hyponatremia: Secondary | ICD-10-CM | POA: Diagnosis present

## 2014-01-13 DIAGNOSIS — D72829 Elevated white blood cell count, unspecified: Secondary | ICD-10-CM | POA: Diagnosis present

## 2014-01-13 DIAGNOSIS — L89309 Pressure ulcer of unspecified buttock, unspecified stage: Secondary | ICD-10-CM | POA: Diagnosis present

## 2014-01-13 DIAGNOSIS — K219 Gastro-esophageal reflux disease without esophagitis: Secondary | ICD-10-CM | POA: Diagnosis present

## 2014-01-13 DIAGNOSIS — R609 Edema, unspecified: Secondary | ICD-10-CM

## 2014-01-13 DIAGNOSIS — Z6832 Body mass index (BMI) 32.0-32.9, adult: Secondary | ICD-10-CM

## 2014-01-13 DIAGNOSIS — Z79899 Other long term (current) drug therapy: Secondary | ICD-10-CM | POA: Diagnosis not present

## 2014-01-13 DIAGNOSIS — E669 Obesity, unspecified: Secondary | ICD-10-CM | POA: Diagnosis present

## 2014-01-13 DIAGNOSIS — I10 Essential (primary) hypertension: Secondary | ICD-10-CM | POA: Diagnosis present

## 2014-01-13 DIAGNOSIS — L8992 Pressure ulcer of unspecified site, stage 2: Secondary | ICD-10-CM | POA: Diagnosis present

## 2014-01-13 DIAGNOSIS — Z66 Do not resuscitate: Secondary | ICD-10-CM | POA: Diagnosis present

## 2014-01-13 DIAGNOSIS — R392 Extrarenal uremia: Secondary | ICD-10-CM

## 2014-01-13 DIAGNOSIS — N2889 Other specified disorders of kidney and ureter: Secondary | ICD-10-CM | POA: Diagnosis not present

## 2014-01-13 DIAGNOSIS — Z23 Encounter for immunization: Secondary | ICD-10-CM | POA: Diagnosis not present

## 2014-01-13 DIAGNOSIS — A498 Other bacterial infections of unspecified site: Secondary | ICD-10-CM | POA: Diagnosis present

## 2014-01-13 DIAGNOSIS — Z7982 Long term (current) use of aspirin: Secondary | ICD-10-CM | POA: Diagnosis not present

## 2014-01-13 DIAGNOSIS — R600 Localized edema: Secondary | ICD-10-CM | POA: Diagnosis present

## 2014-01-13 DIAGNOSIS — K5900 Constipation, unspecified: Secondary | ICD-10-CM | POA: Diagnosis present

## 2014-01-13 DIAGNOSIS — N390 Urinary tract infection, site not specified: Secondary | ICD-10-CM | POA: Diagnosis present

## 2014-01-13 DIAGNOSIS — R7989 Other specified abnormal findings of blood chemistry: Secondary | ICD-10-CM | POA: Diagnosis not present

## 2014-01-13 DIAGNOSIS — R339 Retention of urine, unspecified: Secondary | ICD-10-CM | POA: Diagnosis not present

## 2014-01-13 DIAGNOSIS — I509 Heart failure, unspecified: Secondary | ICD-10-CM | POA: Diagnosis not present

## 2014-01-13 DIAGNOSIS — N179 Acute kidney failure, unspecified: Principal | ICD-10-CM | POA: Diagnosis present

## 2014-01-13 DIAGNOSIS — I159 Secondary hypertension, unspecified: Secondary | ICD-10-CM

## 2014-01-13 LAB — URINALYSIS, ROUTINE W REFLEX MICROSCOPIC
BILIRUBIN URINE: NEGATIVE
Glucose, UA: NEGATIVE mg/dL
KETONES UR: NEGATIVE mg/dL
NITRITE: NEGATIVE
Protein, ur: 100 mg/dL — AB
Specific Gravity, Urine: 1.011 (ref 1.005–1.030)
Urobilinogen, UA: 1 mg/dL (ref 0.0–1.0)
pH: 6.5 (ref 5.0–8.0)

## 2014-01-13 LAB — CBC WITH DIFFERENTIAL/PLATELET
BASOS ABS: 0 10*3/uL (ref 0.0–0.1)
Basophils Relative: 0 % (ref 0–1)
EOS ABS: 0.1 10*3/uL (ref 0.0–0.7)
EOS PCT: 1 % (ref 0–5)
HCT: 35.2 % — ABNORMAL LOW (ref 36.0–46.0)
Hemoglobin: 12.2 g/dL (ref 12.0–15.0)
LYMPHS PCT: 21 % (ref 12–46)
Lymphs Abs: 3 10*3/uL (ref 0.7–4.0)
MCH: 31.2 pg (ref 26.0–34.0)
MCHC: 34.7 g/dL (ref 30.0–36.0)
MCV: 90 fL (ref 78.0–100.0)
MONO ABS: 1.8 10*3/uL — AB (ref 0.1–1.0)
Monocytes Relative: 12 % (ref 3–12)
Neutro Abs: 9.6 10*3/uL — ABNORMAL HIGH (ref 1.7–7.7)
Neutrophils Relative %: 66 % (ref 43–77)
Platelets: 319 10*3/uL (ref 150–400)
RBC: 3.91 MIL/uL (ref 3.87–5.11)
RDW: 12.8 % (ref 11.5–15.5)
WBC: 14.4 10*3/uL — AB (ref 4.0–10.5)

## 2014-01-13 LAB — BASIC METABOLIC PANEL
Anion gap: 17 — ABNORMAL HIGH (ref 5–15)
BUN: 36 mg/dL — ABNORMAL HIGH (ref 6–23)
CALCIUM: 10 mg/dL (ref 8.4–10.5)
CO2: 22 mEq/L (ref 19–32)
CREATININE: 2.37 mg/dL — AB (ref 0.50–1.10)
Chloride: 90 mEq/L — ABNORMAL LOW (ref 96–112)
GFR calc non Af Amer: 17 mL/min — ABNORMAL LOW (ref >90)
GFR, EST AFRICAN AMERICAN: 20 mL/min — AB (ref >90)
Glucose, Bld: 117 mg/dL — ABNORMAL HIGH (ref 70–99)
Potassium: 4.8 mEq/L (ref 3.7–5.3)
Sodium: 129 mEq/L — ABNORMAL LOW (ref 137–147)

## 2014-01-13 LAB — URINE MICROSCOPIC-ADD ON

## 2014-01-13 MED ORDER — DEXTROSE 5 % IV SOLN
1.0000 g | INTRAVENOUS | Status: DC
  Administered 2014-01-14 – 2014-01-16 (×3): 1 g via INTRAVENOUS
  Filled 2014-01-13 (×3): qty 10

## 2014-01-13 MED ORDER — ASPIRIN EC 81 MG PO TBEC
81.0000 mg | DELAYED_RELEASE_TABLET | Freq: Every day | ORAL | Status: DC
  Administered 2014-01-14 – 2014-01-16 (×3): 81 mg via ORAL
  Filled 2014-01-13 (×3): qty 1

## 2014-01-13 MED ORDER — DEXTROSE 5 % IV SOLN
1.0000 g | Freq: Once | INTRAVENOUS | Status: AC
  Administered 2014-01-13: 1 g via INTRAVENOUS
  Filled 2014-01-13: qty 10

## 2014-01-13 MED ORDER — NAPHAZOLINE HCL 0.1 % OP SOLN
1.0000 [drp] | Freq: Two times a day (BID) | OPHTHALMIC | Status: DC
Start: 2014-01-13 — End: 2014-01-16
  Administered 2014-01-13 – 2014-01-16 (×6): 1 [drp] via OPHTHALMIC
  Filled 2014-01-13: qty 15

## 2014-01-13 MED ORDER — FLUOXETINE HCL 10 MG PO CAPS
10.0000 mg | ORAL_CAPSULE | Freq: Every day | ORAL | Status: DC
  Administered 2014-01-13 – 2014-01-16 (×4): 10 mg via ORAL
  Filled 2014-01-13 (×4): qty 1

## 2014-01-13 MED ORDER — ENOXAPARIN SODIUM 30 MG/0.3ML ~~LOC~~ SOLN
30.0000 mg | SUBCUTANEOUS | Status: DC
  Administered 2014-01-13 – 2014-01-15 (×3): 30 mg via SUBCUTANEOUS
  Filled 2014-01-13 (×4): qty 0.3

## 2014-01-13 MED ORDER — INFLUENZA VAC SPLIT QUAD 0.5 ML IM SUSY
0.5000 mL | PREFILLED_SYRINGE | INTRAMUSCULAR | Status: AC
  Administered 2014-01-14: 0.5 mL via INTRAMUSCULAR
  Filled 2014-01-13 (×2): qty 0.5

## 2014-01-13 MED ORDER — ONDANSETRON HCL 4 MG/2ML IJ SOLN: 4.0000 mg | Freq: Four times a day (QID) | INTRAMUSCULAR | Status: DC | PRN

## 2014-01-13 MED ORDER — SODIUM CHLORIDE 0.9 % IV SOLN: INTRAVENOUS | Status: DC

## 2014-01-13 MED ORDER — DOCUSATE SODIUM 100 MG PO CAPS
100.0000 mg | ORAL_CAPSULE | Freq: Every day | ORAL | Status: DC | PRN
  Administered 2014-01-13: 100 mg via ORAL
  Filled 2014-01-13: qty 1

## 2014-01-13 MED ORDER — SENNA 8.6 MG PO TABS
1.0000 | ORAL_TABLET | Freq: Every day | ORAL | Status: DC | PRN
  Administered 2014-01-13: 8.6 mg via ORAL
  Filled 2014-01-13: qty 1

## 2014-01-13 MED ORDER — PANTOPRAZOLE SODIUM 40 MG PO TBEC
40.0000 mg | DELAYED_RELEASE_TABLET | Freq: Every day | ORAL | Status: DC
  Administered 2014-01-13 – 2014-01-16 (×4): 40 mg via ORAL
  Filled 2014-01-13 (×5): qty 1

## 2014-01-13 MED ORDER — SODIUM CHLORIDE 0.9 % IV SOLN
INTRAVENOUS | Status: DC
  Administered 2014-01-13: 75 mL/h via INTRAVENOUS
  Administered 2014-01-15: 04:00:00 via INTRAVENOUS
  Administered 2014-01-16: 75 mL/h via INTRAVENOUS

## 2014-01-13 MED ORDER — BISACODYL 10 MG RE SUPP: 10.0000 mg | Freq: Once | RECTAL | Status: DC

## 2014-01-13 MED ORDER — ONDANSETRON HCL 4 MG PO TABS: 4.0000 mg | ORAL_TABLET | Freq: Four times a day (QID) | ORAL | Status: DC | PRN

## 2014-01-13 NOTE — ED Notes (Signed)
Pt here last week and d/c home on Sunday.  Pt had foley cath removed on Sunday.  Having difficult urinating since then.

## 2014-01-13 NOTE — H&P (Addendum)
History and Physical  Mack Thurmon JXB:[id number] DOB: 1922-01-07 DOA: 01/13/2014  Referring physician: Rolland Porter, ER hy PCP: Note PCP  Chief Complaint: Urinary retention  HPI: Elizabeth Jarvis is a 80 y.o. female  With past medical history of hypertension and obesity who was discharged 2 days prior from the hospitalist service on 9/6 after episode of urinary retention and acute renal failure. By day of discharge, patient was able to void on her own and her creatinine was 1.12. Since going home, the patient has been unable to void as well as have bowel movements and she came back into the emergency room today, brought in by her son. Patient's creatinine was noted to be 2.37 and her white count was 14. The emergency room was able to place a Foley catheter and patient voided 2.6 L. Hospitalists were called for further evaluation and admission. Patient was given a dose of IV Rocephin.   Review of Systems:  Patient seen after arrival to floor. Pt complains of constipation, increased lower extremity edema which has become a new problem starting soon before her previous admission, lack of appetite, suprapubic discomfort. Patient also has some sores on her backside  Pt denies any headaches, vision changes, dysphagia, chest pain, palpitations, shortness of breath, wheeze, cough, hematuria, diarrhea, focal extremity numbness or weakness or pain.  Review of systems are otherwise negative  Past Medical History  Diagnosis Date  . GERD (gastroesophageal reflux disease)    History reviewed. No pertinent past surgical history. Social History:  reports that she has never smoked. She has never used smokeless tobacco. She reports that she does not drink alcohol or use illicit drugs. Patient lives at home with her son & is able to participate in activities of daily living with use of a walker, although she does spend a lot of time in the chair  No Known Allergies  History reviewed. No pertinent family history.   As confirmed by son  Prior to Admission medications   Medication Sig Start Date End Date Taking? Authorizing Provider  aspirin EC 81 MG tablet Take 81 mg by mouth daily.   Yes Historical Provider, MD  b complex vitamins tablet Take 1 tablet by mouth daily.   Yes Historical Provider, MD  calcium-vitamin D (OSCAL WITH D) 500-200 MG-UNIT per tablet Take 1 tablet by mouth daily with breakfast.   Yes Historical Provider, MD  furosemide (LASIX) 20 MG tablet Take 20 mg by mouth daily as needed for fluid.   Yes Historical Provider, MD  omeprazole (PRILOSEC) 20 MG capsule Take 20 mg by mouth daily.   Yes Historical Provider, MD  tetrahydrozoline (VISINE) 0.05 % ophthalmic solution Place 1 drop into both eyes 2 (two) times daily.   Yes Historical Provider, MD  Zinc Oxide (DESITIN MAXIMUM STRENGTH) 40 % PSTE Apply to affected area 3 times a day 01/11/14  Yes Clydia Llano, MD  docusate sodium (COLACE) 100 MG capsule Take 100 mg by mouth daily as needed for mild constipation.    Historical Provider, MD  FLUoxetine (PROZAC) 10 MG capsule Take 1 capsule (10 mg total) by mouth daily. 01/11/14   Clydia Llano, MD    Physical Exam: BP 156/74  Pulse 103  Temp(Src) 97.4 F (36.3 C) (Oral)  Resp 20  Ht 5' (1.524 m)  Wt 75.3 kg (166 lb 0.1 oz)  BMI 32.42 kg/m2  SpO2 100%  General:  Alert and oriented x3, no acute distress Eyes: Sclera nonicteric, signs of cataracts ENT: Normocephalic, atraumatic, mucous membranes  are slightly dry Neck: No JVD Cardiovascular: Regular rate and rhythm, S1-S2, borderline tachycardia Respiratory: Clear to auscultation bilaterally Abdomen: Soft, non-distended, hypoactive bowel sounds, mild suprapubic tenderness Skin: Patient noted to have some stage II decubitus ulcers on her backside Musculoskeletal: No clubbing or cyanosis, 2-3+ pitting edema from the ankles down Psychiatric:  Patient is appropriate, no evidence of psychoses Neurologic: No focal deficits           Labs on  Admission:  Basic Metabolic Panel:  Recent Labs Lab 01/08/14 0403 01/09/14 0430 01/10/14 0443 01/11/14 0534 01/11/14 0551 01/13/14 1405  NA 125* 131* 132* 132*  --  129*  K 3.2* 3.9 4.8 4.2  --  4.8  CL 84* 94* 97 96  --  90*  CO2 --  22  GLUCOSE 91 101* 99 94  --  117*  BUN 71* 43* 24* 14  --  36*  CREATININE 3.69* 1.88* 1.23* 1.12*  --  2.37*  CALCIUM 8.2* 8.2* 8.4 8.7  --  10.0  MG  --  2.4  --   --  1.5  --   PHOS  --  2.4 1.9* 3.8  --   --    Liver Function Tests:  Recent Labs Lab 01/07/14 1631 01/09/14 0430 01/10/14 0443 01/11/14 0534  AST 20  --   --   --   ALT 12  --   --   --   ALKPHOS 82  --   --   --   BILITOT 1.0  --   --   --   PROT 7.6  --   --   --   ALBUMIN 3.5 2.6* 2.8* 2.8*    Recent Labs Lab 01/07/14 1631  LIPASE 72*   No results found for this basename: AMMONIA,  in the last 168 hours CBC:  Recent Labs Lab 01/07/14 1631 01/08/14 0403 01/13/14 1405  WBC 11.7* 8.0 14.4*  NEUTROABS 8.6*  --  9.6*  HGB 11.1* 9.5* 12.2  HCT 30.7* 26.1* 35.2*  MCV 84.8 85.0 90.0  PLT 240 204 319   Cardiac Enzymes: No results found for this basename: CKTOTAL, CKMB, CKMBINDEX, TROPONINI,  in the last 168 hours  BNP (last 3 results)  Recent Labs  01/07/14 1631  PROBNP 592.6*     Assessment/Plan Present on Admission:   . Acute renal failure: Secondary to urinary retention. She improved with Foley catheterization plus IV fluids. See below.  . Edema of both legs: Unclear etiology. May in part be from some third spacing. We'll check formal echocardiogram. Lower extremity Doppler done last admission unrevealing.  Marland Kitchen Urinary retention: Cause unclear. Status post catheter. Have placed call to urology to consult.  Marland Kitchen HTN (hypertension): Likely in part due to urinary retention and early infection. When necessary beta blocker  . Leukocytosis, unspecified: Likely early UTI from one week of urinary retention. On IV Rocephin, urine cultures  pending.  Marland Kitchen Hyponatremia: Likely from acute renal failure dehydration. Mild. Treat with IV fluids.  Marland Kitchen Unspecified constipation: Due in part to compression by extended bladder. Will give suppository tonight plus stool softener  . Decubitus ulcer limited to breakdown of skin (stage 2): Secondary to continued prolonged sitting. Patient seen by wound care previous hospitalization. Silicone Dressings plus zinc barrier cream and redistribution pillow.  Obesity: Patient meets criteria with BMI greater than 30.  Consultants: Urology called  Code Status: DO NOT RESUSCITATE as confirmed by patient  Family Communication: Son at the  bedside   Disposition Plan: Await disposition by urology, improvement in leukocytosis with creatinine, home likely in 1-2 days  Time spent: 35 minutes  Hollice Espy [facility] Pager [phone]  **Disclaimer: This note may have been dictated with voice recognition software. Similar sounding words can inadvertently be transcribed and this note may contain transcription errors which may not have been corrected upon publication of note.**

## 2014-01-13 NOTE — Progress Notes (Signed)
CARE MANAGEMENT ED NOTE 01/13/2014  Patient:  Elizabeth Jarvis, Elizabeth Jarvis   Account Number:  [id number]  Date Initiated:  01/13/2014  Documentation initiated by:  Edd Arbour  Subjective/Objective Assessment:   80 yr old medicare covered [place] pt with recent hospitalization 01/07/14 to 01/11/14, Sunday.  Foley cath removed on Sunday.  Having difficult urinating since then.     Subjective/Objective Assessment Detail:   [facility] with [facility] for HH-1 RN  HH-2 PT  HH-4 NURSE'S AIDE  and has received services in home  Son at bedside reports recently moved to [facility]  Recommended a pcp but son states pcp is not near the home  Preference for new pcp is for pcp to be a female and near [address] or [address] in [place]     Action/Plan:   ED CM spoke with pt/son about pcp services for follow up ED CM called Melanie Crazier at [employer] to find out their appointment for medicare pts are out to November Left a voice message at [facility] after 2 calls placed Pending   Action/Plan Detail:   a return call Son updated Entered in appointment information   Anticipated DC Date:  01/16/2014     Status Recommendation to Physician:   Result of Recommendation:    Other ED Services  Consult Working Plan    DC Planning Services  Other  PCP issues  Outpatient Services - Pt will follow up   [facility]  HOME HEALTH  Resumption Of Svcs/PTA Provider   Choice offered to / List presented to:  C-1 Patient  DME arranged  3-N-1     DME agency  [facility]   HH arranged  HH-1 RN  HH-2 PT  HH-4 NURSE'S AIDE      HH agency  [facility]    Status of service:  Completed, signed off  ED Comments:   ED Comments Detail:  01/13/14 1630 Cm spoke with Neysa Bonito R at [place] [employer] Scheduled an appointment with Dr Barbaraann Barthel at 1400 on Tuesday 01/20/14 Provided home number listed in [facility] for son [place], [place] [phone]  until CM or son  is able to call with a better number Spoke with pt who is hard of hearing She confirms son has a home and cell number Son has left room per pt but will be back Informed her of pcp appointment entered in [facility] 1613 Called and left a voice message at [facility] requesting a return call to schedule an appt with Dr Hyman Hopes & sent an email to [employer] (requesting a return call to Cm 1610 [facility], Kristen [phone], notified of pt re admission Clinton Gallant, RN, CCM [phone] PCP is listed as Dr Hyman Hopes of [facility] 1600 Pt does not have a pcp although son was given a list of providers at d/c per son.  There has not been a f/u to any provider since 01/11/14 d/c from hospital ED CM attempt to make appointments for dermatology and pcp but son can not recall his cell number.  Son provided with a list of his preference for a medicare accepting female provider near [place] [phone] - Melanie Crazier at [employer] and [facility] Discussed [place] appointments are out to November per office staff Son needs to follow up to providers with his telephone number  Follow-up With Details Comments Contact Info Clayborn Heron, MD On 01/20/2014 You have been schedule an appointment at [facility] of  [employer] [employer] per your request for a closer family doctor Please attend this appointment on 01/20/14 at 2 pm with Dr Barbaraann Barthel or call to reschedule if needed  Nils Flack, MD Schedule an appointment as soon as possible for a visit A referral has been sent on 01/13/14 to this dermatologist per your request for a dermatologist in zip [phone]. this Dermatologist needs to be called with an updated cell number and to confirm the date and time of appointment for you  [address] [place] [address] [phone]

## 2014-01-13 NOTE — ED Notes (Signed)
Initial Contact - pt a+ox4, family at bedside reports pt had foley cath removed x2 days ago and has had difficulty urinating since.  Pt reports c/o mild abd pressure, denies other complaints.  Skin PWD.  MAEI.  Speaking full/clear sentences.  NAD.

## 2014-01-13 NOTE — ED Notes (Signed)
Unable to bladder scan pt as machine is not working.

## 2014-01-13 NOTE — Clinical Social Work Note (Signed)
CSW met with son at bedside- CSW noted that patient was recently released from hospital to home- SNF had been recommended however family opted to take her home. Son reports things are going well at home- aside from her not being able to void he states they were managing well- The [facility] Nurse had come out prior to this visit- son inquiring about supplemental insurance options- discussed Medicaid as well as Medicare secondary options- provided him with information for follow up. Son appreciative of assistance- patient remains in the ED at this time.   Eduard Clos, MSW, [place]

## 2014-01-13 NOTE — ED Provider Notes (Addendum)
CSN: [id number]     Arrival date & time 01/13/14  1132 History   First MD Initiated Contact with Patient 01/13/14 1229     Chief Complaint  Patient presents with  . Urinary Retention      HPI  Vision presents with her son. She was discharged on Sunday, 48 hours ago. Admitted with urinary retention, hydronephrosis, acute kidney injury secondary to post renal azotemia. Symptoms and labs and renal function improved with Foley catheter. Catheter was removed on day of discharge. Was able to urinate that day. Has not been able to urinate since discharge. Feels lower abdominal pain and pressure. Not as weak and debilitated she was previously but complains of weakness and abdominal pain.  No history of prior episodes of urinary retention. No neurological condition strokes MS or otherwise.  The son states that prior to her most recent episode she was active healthy in her own home.  Past Medical History  Diagnosis Date  . GERD (gastroesophageal reflux disease)    History reviewed. No pertinent past surgical history. History reviewed. No pertinent family history. History  Substance Use Topics  . Smoking status: Never Smoker   . Smokeless tobacco: Not on file  . Alcohol Use: No   OB History   Grav Para Term Preterm Abortions TAB SAB Ect Mult Living                 Review of Systems  Constitutional: Negative for fever, chills, diaphoresis, appetite change and fatigue.  HENT: Negative for mouth sores, sore throat and trouble swallowing.   Eyes: Negative for visual disturbance.  Respiratory: Negative for cough, chest tightness, shortness of breath and wheezing.   Cardiovascular: Negative for chest pain.  Gastrointestinal: Positive for abdominal pain. Negative for nausea, vomiting, diarrhea and abdominal distention.  Endocrine: Negative for polydipsia, polyphagia and polyuria.  Genitourinary: Positive for decreased urine volume, difficulty urinating and pelvic pain. Negative for dysuria,  frequency and hematuria.  Musculoskeletal: Negative for gait problem.  Skin: Negative for color change, pallor and rash.  Neurological: Negative for dizziness, syncope, light-headedness and headaches.  Hematological: Does not bruise/bleed easily.  Psychiatric/Behavioral: Negative for behavioral problems and confusion.      Allergies  Review of patient's allergies indicates no known allergies.  Home Medications   Prior to Admission medications   Medication Sig Start Date End Date Taking? Authorizing Provider  aspirin EC 81 MG tablet Take 81 mg by mouth daily.   Yes Historical Provider, MD  b complex vitamins tablet Take 1 tablet by mouth daily.   Yes Historical Provider, MD  calcium-vitamin D (OSCAL WITH D) 500-200 MG-UNIT per tablet Take 1 tablet by mouth daily with breakfast.   Yes Historical Provider, MD  furosemide (LASIX) 20 MG tablet Take 20 mg by mouth daily as needed for fluid.   Yes Historical Provider, MD  omeprazole (PRILOSEC) 20 MG capsule Take 20 mg by mouth daily.   Yes Historical Provider, MD  tetrahydrozoline (VISINE) 0.05 % ophthalmic solution Place 1 drop into both eyes 2 (two) times daily.   Yes Historical Provider, MD  Zinc Oxide (DESITIN MAXIMUM STRENGTH) 40 % PSTE Apply to affected area 3 times a day 01/11/14  Yes Clydia Llano, MD  docusate sodium (COLACE) 100 MG capsule Take 100 mg by mouth daily as needed for mild constipation.    Historical Provider, MD  FLUoxetine (PROZAC) 10 MG capsule Take 1 capsule (10 mg total) by mouth daily. 01/11/14   Clydia Llano, MD  BP 134/62  Pulse 107  Temp(Src) 97.7 F (36.5 C) (Oral)  Resp 16  SpO2 99% Physical Exam  Constitutional: She is oriented to person, place, and time. She appears well-developed and well-nourished. No distress.  HENT:  Head: Normocephalic.  Eyes: Conjunctivae are normal. Pupils are equal, round, and reactive to light. No scleral icterus.  Neck: Normal range of motion. Neck supple. No thyromegaly  present.  Cardiovascular: Normal rate and regular rhythm.  Exam reveals no gallop and no friction rub.   No murmur heard. Pulmonary/Chest: Effort normal and breath sounds normal. No respiratory distress. She has no wheezes. She has no rales.  Abdominal: Soft. Bowel sounds are normal. She exhibits distension. There is tenderness. There is no rebound.    Patient denies flank pain. Has no flank tenderness. Limited bedside ultrasound shows bilateral hydronephrosis.  Musculoskeletal: Normal range of motion.  Neurological: She is alert and oriented to person, place, and time.  Skin: Skin is warm and dry. No rash noted.  Psychiatric: She has a normal mood and affect. Her behavior is normal.    ED Course  Procedures (including critical care time) Labs Review Labs Reviewed  CBC WITH DIFFERENTIAL - Abnormal; Notable for the following:    WBC 14.4 (*)    HCT 35.2 (*)    Neutro Abs 9.6 (*)    Monocytes Absolute 1.8 (*)    All other components within normal limits  BASIC METABOLIC PANEL - Abnormal; Notable for the following:    Sodium 129 (*)    Chloride 90 (*)    Glucose, Bld 117 (*)    BUN 36 (*)    Creatinine, Ser 2.37 (*)    GFR calc non Af Amer 17 (*)    GFR calc Af Amer 20 (*)    Anion gap 17 (*)    All other components within normal limits  URINALYSIS, ROUTINE W REFLEX MICROSCOPIC - Abnormal; Notable for the following:    APPearance TURBID (*)    Hgb urine dipstick LARGE (*)    Protein, ur 100 (*)    Leukocytes, UA LARGE (*)    All other components within normal limits  URINE CULTURE  URINE MICROSCOPIC-ADD ON    Imaging Review No results found.   EKG Interpretation None      MDM   Final diagnoses:  Urinary retention  Acute kidney injury  Hydronephrosis, unspecified hydronephrosis type  Extrarenal azotemia    Foley catheter placed. Drains 1500 cc of cloudy urine. Culture requested. Labs show hyponatremia 129. Has acute kidney injury with creatinine of 2.37 BUN of  36. Should return to normal BUN and creatinine prior to discharge. Urine is nitrate negative but has large leukocytes, and too numerous to count white blood cells.  She is awake and mentating well. Is not febrile. Does not appear septic/toxic. Patient had a previous catheter for over 4 days. This may be simple bladder stretch injury and may simply only require prolonged catheter placement for her bladder to heal from the stretch injury. I do not have a clear etiology for her initial episode of urinary retention.    Rolland Porter, MD 01/13/14 1517  Rolland Porter, MD 01/13/14 [phone]

## 2014-01-14 DIAGNOSIS — I509 Heart failure, unspecified: Secondary | ICD-10-CM

## 2014-01-14 DIAGNOSIS — N179 Acute kidney failure, unspecified: Secondary | ICD-10-CM | POA: Diagnosis not present

## 2014-01-14 DIAGNOSIS — N1330 Unspecified hydronephrosis: Secondary | ICD-10-CM

## 2014-01-14 LAB — BASIC METABOLIC PANEL
Anion gap: 13 (ref 5–15)
BUN: 33 mg/dL — ABNORMAL HIGH (ref 6–23)
CHLORIDE: 96 meq/L (ref 96–112)
CO2: 22 meq/L (ref 19–32)
Calcium: 8.4 mg/dL (ref 8.4–10.5)
Creatinine, Ser: 1.87 mg/dL — ABNORMAL HIGH (ref 0.50–1.10)
GFR calc Af Amer: 26 mL/min — ABNORMAL LOW (ref >90)
GFR calc non Af Amer: 23 mL/min — ABNORMAL LOW (ref >90)
GLUCOSE: 98 mg/dL (ref 70–99)
POTASSIUM: 4.1 meq/L (ref 3.7–5.3)
SODIUM: 131 meq/L — AB (ref 137–147)

## 2014-01-14 LAB — CBC
HCT: 25.2 % — ABNORMAL LOW (ref 36.0–46.0)
HEMOGLOBIN: 8.8 g/dL — AB (ref 12.0–15.0)
MCH: 31.5 pg (ref 26.0–34.0)
MCHC: 34.9 g/dL (ref 30.0–36.0)
MCV: 90.3 fL (ref 78.0–100.0)
PLATELETS: 265 10*3/uL (ref 150–400)
RBC: 2.79 MIL/uL — AB (ref 3.87–5.11)
RDW: 12.8 % (ref 11.5–15.5)
WBC: 9.5 10*3/uL (ref 4.0–10.5)

## 2014-01-14 MED ORDER — HYDRALAZINE HCL 25 MG PO TABS
25.0000 mg | ORAL_TABLET | Freq: Four times a day (QID) | ORAL | Status: DC | PRN
Start: 2014-01-14 — End: 2014-01-16
  Administered 2014-01-15: 25 mg via ORAL
  Filled 2014-01-14: qty 1

## 2014-01-14 MED ORDER — HYDROMORPHONE HCL PF 1 MG/ML IJ SOLN
0.5000 mg | INTRAMUSCULAR | Status: DC | PRN
  Administered 2014-01-14 (×2): 0.5 mg via INTRAVENOUS
  Filled 2014-01-14 (×2): qty 1

## 2014-01-14 MED ORDER — ENSURE PUDDING PO PUDG
1.0000 | Freq: Two times a day (BID) | ORAL | Status: DC
  Administered 2014-01-14 – 2014-01-16 (×3): 1 via ORAL
  Filled 2014-01-14 (×6): qty 1

## 2014-01-14 MED ORDER — ACETAMINOPHEN 325 MG PO TABS
650.0000 mg | ORAL_TABLET | Freq: Four times a day (QID) | ORAL | Status: DC | PRN
  Administered 2014-01-14: 650 mg via ORAL
  Filled 2014-01-14: qty 2

## 2014-01-14 MED ORDER — ADULT MULTIVITAMIN LIQUID CH
5.0000 mL | Freq: Every day | ORAL | Status: DC
  Administered 2014-01-14 – 2014-01-16 (×3): 5 mL via ORAL
  Filled 2014-01-14 (×3): qty 5

## 2014-01-14 NOTE — Progress Notes (Addendum)
INITIAL NUTRITION ASSESSMENT  DOCUMENTATION CODES Per approved criteria  -Obesity Unspecified   INTERVENTION: -Modify diet to Dys2/chopped foods per pt preference -Encouraged intake of protein foods compliant with vegetarian diet -Recommend addition of Ensure pudding BID, each supplement provides 170 kcal, 4 gram protein -Recommend MVI -Will continue to monitor   NUTRITION DIAGNOSIS: Increased nutrient needs (protein) related to wound healing as evidenced by stage 2 pressure ulcer.   Goal: Pt to meet >/= 90% of their estimated nutrition needs    Monitor:  Diet order, total protein/energy intake, labs, weights, swallow profile, skin integrity  Reason for Assessment: Consult to Assess  80 y.o. female  Admitting Dx: Acute renal failure  ASSESSMENT: Elizabeth Jarvis is a 80 y.o. female With past medical history of hypertension and obesity who was discharged 2 days prior from the hospitalist service on 9/6 after episode of urinary retention and acute renal failure. By day of discharge, patient was able to void on her own and her creatinine was 1.12. Since going home, the patient has been unable to void as well as have bowel movements and she came back into the emergency room today, brought in by her son  -Pt reported poor PO since before previous admit on 9/06. Endorsed problems swallowing, and requires food to be chopped to tolerate. Can tolerate thin liquids; however noted that honey-consistency liquids are easier for her to swallow -Complies to a vegetarian diet- does not eat eggs, chicken, [place], or red meat. Will eat cheese, cottage cheese and yogurt for protein. Consumes soft foods-oatmeal, softened breads, beans. Was unable to elaborate on number of meals per day. Will drink MightyShake occasionally. -Appetite improving during admit. Consumed 50% of breakfast.  -Denied unintentional wt loss, noted to be gaining weight likely d/t bilateral lower extremity edema -Will modify diet  textures to include Dys2 to assist in pt's PO intake.  -Pt w/stg 2 PU on buttock and coccyx, will order Ensure pudding BID for additional protein. Consider addition of Juven as needed, will order MVI.   Height: Ht Readings from Last 1 Encounters:  01/13/14 5' (1.524 m)    Weight: Wt Readings from Last 1 Encounters:  01/13/14 166 lb 0.1 oz (75.3 kg)    Ideal Body Weight: 100 lb  % Ideal Body Weight: 166 lbs  Wt Readings from Last 10 Encounters:  01/13/14 166 lb 0.1 oz (75.3 kg)  01/07/14 172 lb 13.5 oz (78.4 kg)    Usual Body Weight: unable to determine  % Usual Body Weight: unable to determine  BMI:  Body mass index is 32.42 kg/(m^2). Obesity I  Estimated Nutritional Needs: Kcal: 1550-1750 Protein: 75-85 gram Fluid: >/=1600 ml/daily  Skin: stg 2 PU on coccyx and buttock  Diet Order: Sodium Restricted  EDUCATION NEEDS: -Education needs addressed   Intake/Output Summary (Last 24 hours) at 01/14/14 1121 Last data filed at 01/14/14 0900  Gross per 24 hour  Intake 858.75 ml  Output   3300 ml  Net -2441.25 ml    Last BM: 9/07   Labs:   Recent Labs Lab 01/08/14 0403 01/09/14 0430 01/10/14 0443 01/11/14 0534 01/11/14 0551 01/13/14 1405 01/14/14 0350  NA 125* 131* 132* 132*  --  129* 131*  K 3.2* 3.9 4.8 4.2  --  4.8 4.1  CL 84* 94* 97 96  --  90* 96  CO2 --  22 22  BUN 71* 43* 24* 14  --  36* 33*  CREATININE 3.69* 1.88* 1.23* 1.12*  --  2.37* 1.87*  CALCIUM 8.2* 8.2* 8.4 8.7  --  10.0 8.4  MG  --  2.4  --   --  1.5  --   --   PHOS  --  2.4 1.9* 3.8  --   --   --   GLUCOSE 91 101* 99 94  --  117* 98    CBG (last 3)  No results found for this basename: GLUCAP,  in the last 72 hours  Scheduled Meds: . aspirin EC  81 mg Oral Daily  . bisacodyl  10 mg Rectal Once  . cefTRIAXone (ROCEPHIN)  IV  1 g Intravenous Q24H  . enoxaparin (LOVENOX) injection  30 mg Subcutaneous Q24H  . FLUoxetine  10 mg Oral Daily  . naphazoline  1 drop Both  Eyes BID  . pantoprazole  40 mg Oral Daily    Continuous Infusions: . sodium chloride    . sodium chloride 75 mL/hr (01/13/14 1907)    Past Medical History  Diagnosis Date  . GERD (gastroesophageal reflux disease)     History reviewed. No pertinent past surgical history.  Lloyd Huger MS RD LDN Clinical Dietitian Pager:[phone]

## 2014-01-14 NOTE — Consult Note (Signed)
Urology Consult   Physician requesting consult: Dr. Sharl Ma  Reason for consult: Urinary retention and AKI  History of Present Illness: Elizabeth Jarvis is a 80 y.o. who presented to the hospital about one week ago and was admitted with acute renal failure, bilateral hydronephrosis, and a hugely distended bladder noted on CT imaging. She had a catheter placed with subsequent resolution of her acute renal failure with a baseline Cr of 1.12.  Her catheter was apparently removed prior to discharge and she was voiding and so her catheter was left out.  She was again admitted to the hospital yesterday with another rise in her Cr to 2.37 which has begun again decreasing after catheter placement.  She has no prior history of urinary retention prior to the past couple of weeks although she states she has previously seen a urologist and apparently had high post void residual urines.  She denied any bothersome LUTS until the last few weeks at which time she has noted some increased difficulty voiding and some periodic incontinence.  She has also noted decreased number of voids per day to about two per day.   She has no known neurologic risk factors, no history of diabetes, prior urologic surgery, or prior retention/renal dysfunction.  She does have a history of recent constipation which has required treatment.    Past Medical History  Diagnosis Date  . GERD (gastroesophageal reflux disease)     History reviewed. No pertinent past surgical history.   Current Hospital Medications:  Home meds:    Medication List    ASK your doctor about these medications       aspirin EC 81 MG tablet  Take 81 mg by mouth daily.     b complex vitamins tablet  Take 1 tablet by mouth daily.     calcium-vitamin D 500-200 MG-UNIT per tablet  Commonly known as:  OSCAL WITH D  Take 1 tablet by mouth daily with breakfast.     docusate sodium 100 MG capsule  Commonly known as:  COLACE  Take 100 mg by mouth daily as  needed for mild constipation.     FLUoxetine 10 MG capsule  Commonly known as:  PROZAC  Take 1 capsule (10 mg total) by mouth daily.     furosemide 20 MG tablet  Commonly known as:  LASIX  Take 20 mg by mouth daily as needed for fluid.     omeprazole 20 MG capsule  Commonly known as:  PRILOSEC  Take 20 mg by mouth daily.     VISINE 0.05 % ophthalmic solution  Generic drug:  tetrahydrozoline  Place 1 drop into both eyes 2 (two) times daily.     Zinc Oxide 40 % Pste  Commonly known as:  DESITIN MAXIMUM STRENGTH  Apply to affected area 3 times a day        Scheduled Meds: . aspirin EC  81 mg Oral Daily  . bisacodyl  10 mg Rectal Once  . cefTRIAXone (ROCEPHIN)  IV  1 g Intravenous Q24H  . enoxaparin (LOVENOX) injection  30 mg Subcutaneous Q24H  . feeding supplement (ENSURE)  1 Container Oral BID WC  . FLUoxetine  10 mg Oral Daily  . multivitamin  5 mL Oral Daily  . naphazoline  1 drop Both Eyes BID  . pantoprazole  40 mg Oral Daily   Continuous Infusions: . sodium chloride    . sodium chloride 75 mL/hr (01/13/14 1907)   PRN Meds:.acetaminophen, docusate sodium, hydrALAZINE, HYDROmorphone (DILAUDID) injection,  ondansetron (ZOFRAN) IV, ondansetron, senna  Allergies: No Known Allergies  History reviewed. No pertinent family history.  Social History:  reports that she has never smoked. She has never used smokeless tobacco. She reports that she does not drink alcohol or use illicit drugs.  ROS: A complete review of systems was performed.  All systems are negative except for pertinent findings as noted.  Physical Exam:  Vital signs in last 24 hours: Temp:  [97.7 F (36.5 C)-97.8 F (36.6 C)] 97.7 F (36.5 C) (09/09 1500) Pulse Rate:  [90-111] 103 (09/09 1500) Resp:  [18-19] 18 (09/09 1500) BP: (104-150)/(51-71) 150/71 mmHg (09/09 1500) SpO2:  [99 %-100 %] 100 % (09/09 1500) Constitutional:  Alert and oriented, No acute distress Cardiovascular: Regular rate and  rhythm, No JVD Respiratory: Normal respiratory effort GI: Abdomen is soft, nontender, nondistended, no abdominal masses GU: No CVA tenderness Lymphatic: No lymphadenopathy Neurologic: Grossly intact, no focal deficits Psychiatric: Normal mood and affect  Laboratory Data:   Recent Labs  01/13/14 1405 01/14/14 0350  WBC 14.4* 9.5  HGB 12.2 8.8*  HCT 35.2* 25.2*  PLT 319 265     Recent Labs  01/13/14 1405 01/14/14 0350  NA 129* 131*  K 4.8 4.1  CL 90* 96  GLUCOSE 117* 98  BUN 36* 33*  CALCIUM 10.0 8.4  CREATININE 2.37* 1.87*     Results for orders placed during the hospital encounter of 01/13/14 (from the past 24 hour(s))  BASIC METABOLIC PANEL     Status: Abnormal   Collection Time    01/14/14  3:50 AM      Result Value Ref Range   Sodium 131 (*) 137 - 147 mEq/L   Potassium 4.1  3.7 - 5.3 mEq/L   Chloride 96  96 - 112 mEq/L   CO2 22  19 - 32 mEq/L   Glucose, Bld 98  70 - 99 mg/dL   BUN 33 (*) 6 - 23 mg/dL   Creatinine, Ser 1.61 (*) 0.50 - 1.10 mg/dL   Calcium 8.4  8.4 - 09.6 mg/dL   GFR calc non Af Amer 23 (*) >90 mL/min   GFR calc Af Amer 26 (*) >90 mL/min   Anion gap 13  5 - 15  CBC     Status: Abnormal   Collection Time    01/14/14  3:50 AM      Result Value Ref Range   WBC 9.5  4.0 - 10.5 K/uL   RBC 2.79 (*) 3.87 - 5.11 MIL/uL   Hemoglobin 8.8 (*) 12.0 - 15.0 g/dL   HCT 04.5 (*) 40.9 - 81.1 %   MCV 90.3  78.0 - 100.0 fL   MCH 31.5  26.0 - 34.0 pg   MCHC 34.9  30.0 - 36.0 g/dL   RDW 91.4  78.2 - 95.6 %   Platelets 265  150 - 400 K/uL   Recent Results (from the past 240 hour(s))  URINE CULTURE     Status: None   Collection Time    01/07/14  5:50 PM      Result Value Ref Range Status   Specimen Description URINE, CATHETERIZED   Final   Special Requests NONE   Final   Culture  Setup Time     Final   Value: 01/08/2014 00:09     Performed at [facility] Count     Final   Value: NO GROWTH     Performed at [facility]  Culture     Final   Value: NO GROWTH     Performed at [facility]   Report Status 01/08/2014 FINAL   Final  CLOSTRIDIUM DIFFICILE BY PCR     Status: None   Collection Time    01/08/14  1:34 AM      Result Value Ref Range Status   C difficile by pcr NEGATIVE  NEGATIVE Final   Comment: Performed at [facility]    Renal Function:  Recent Labs  01/08/14 0403 01/09/14 0430 01/10/14 0443 01/11/14 0534 01/13/14 1405 01/14/14 0350  CREATININE 3.69* 1.88* 1.23* 1.12* 2.37* 1.87*   Estimated Creatinine Clearance: 18.1 ml/min (by C-G formula based on Cr of 1.87).  Radiologic Imaging:  I independently reviewed her CT scan from 01/07/14.  Impression/Recommendation: 1) Urinary retention: The etiology for her urinary retention is unclear but may be related to constipation.  I would recommend an aggressive bowel regimen to help treat constipation acutely and chronically. She needs her catheter left in place for now and will need to be discharged with either an indwelling catheter or with clean intermittent catheterization for current management of her bladder.  She will then need outpatient follow up in 2-4 weeks for further evaluation, more thorough physical exam, and possibly another voiding trial.  If she is unable to void, she may benefit from a urodynamic evaluation in the future.  2) Bilateral hydronephrosis with AKI: This is likely secondary to her urinary retention.  I would recommend she have a renal ultrasound prior to discharge to determine if hydronephrosis is resolved.  Will sign off and will schedule for outpatient follow up.  Dmarco Baldus,LES 01/14/2014, 7:25 PM  Moody Bruins. MD   CC: Dr. Sharl Ma

## 2014-01-14 NOTE — Progress Notes (Addendum)
01/14/18 1610 ED Cm received a return call from Cecila at [facility] [phone] to confirm pt had not been seen by Dr Hyman Hopes and did not have an upcoming appointment.  Cm spoke with Baxter Hire of [facility] [phone]  to update her that pt has a new pcp appointment with Dr Barbaraann Barthel for 01/20/14 1400 01/14/18 0932 ED Cm left a voice message with [phone], son contact number for the [facility] [phone] No noted [facility] appointment entry in epic  0920 ED Cm received an email that [facility] staff called and request a return call Cm called 01/14/14 0920 and left a voice message requesting a return call to Cm mobile Number Pending a return call

## 2014-01-14 NOTE — Progress Notes (Signed)
Received report form other nurse and agree with assessment.

## 2014-01-14 NOTE — Progress Notes (Signed)
[facility] Care Management Progress Note:  This Case Manager reviewed patient's EMR and determined patient would benefit from chronic care management services through the [facility].This Case Manager met with patient to discuss the services and medical management that can be provided at the [facility]. Patient uncertain if she would like to follow-up with the [facility] after discharge. She indicates she plans to discuss the clinic with her son. Provided patient with information on the [facility] as well as contact number in case she decides she would like to receive chronic care management services through the clinic after discharge.   Patient verbalized understanding.

## 2014-01-14 NOTE — Progress Notes (Signed)
  Echocardiogram 2D Echocardiogram has been performed.  [name] M 01/14/2014, 3:22 PM

## 2014-01-14 NOTE — Progress Notes (Signed)
[facility] PROGRESS NOTE  Elizabeth Jarvis ZOX:[id number] DOB: [birth year] DOA: 01/13/2014 PCP: No primary provider on file.  Assessment/Plan: 1. Acute renal failure- secondary to obstructive uropathy, Foley catheter in place. Improving. Creatinine is down to 1.87. 2. Bilateral hydronephrosis- CT abdomen pelvis on 01/07/2014 showed over dilation of the bladder with associated hydronephrosis. Called and discussed with urology, they will see the patient in the hospital. 3. Hypertension- patient has mild elevation of the blood pressure, she's not on any antihypertensive regimen at home. Will continue to monitor the patient's blood pressure. Start hydralazine 25 mg by mouth every 6 hours when necessary for BP greater than 160/100. 4. Hyponatremia- slowly improving, per her sodium was 131. We'll continue to monitor. 5. Bilateral lower extremity edema- 2-D echocardiogram has been ordered. Albumin level is 2.8. 6. Decubitus ulcer stage II- wound care following  Code Status: DNR Family Communication: *No family at bedside Disposition Plan: Home when stable   Consultants:  None  Procedures:  None  Antibiotics:  Rocephin  HPI/Subjective: 80 y.o. female with past medical history of hypertension and obesity who was discharged 2 days prior from the hospitalist service on 9/6 after episode of urinary retention and acute renal failure. By day of discharge, patient was able to void on her own and her creatinine was 1.12. Since going home, the patient has been unable to void as well as have bowel movements and she came back into the emergency room today, brought in by her son. Patient's creatinine was noted to be 2.37 and her white count was 14. The emergency room was able to place a Foley catheter and patient voided 2.6 L. Hospitalists were called for further evaluation and admission. Patient was given a dose of IV Rocephin.  Patient feels better today, foley catheter in place. Renal function is  slowly improving.   Objective: Filed Vitals:   01/14/14 1500  BP: 150/71  Pulse: 103  Temp: 97.7 F (36.5 C)  Resp: 18    Intake/Output Summary (Last 24 hours) at 01/14/14 1633 Last data filed at 01/14/14 1500  Gross per 24 hour  Intake 1098.75 ml  Output   1700 ml  Net -601.25 ml   Filed Weights   01/13/14 1730  Weight: 75.3 kg (166 lb 0.1 oz)    Exam:  Physical Exam: Head: Normocephalic, atraumatic.  Neck: supple,No deformities, masses, or tenderness noted. Lungs: Normal respiratory effort. B/L Clear to auscultation, no crackles or wheezes.  Heart: Regular RR. S1 and S2 normal  Abdomen: BS normoactive. Soft, Nondistended, non-tender.  Extremities: Bilateral 1+ pitting edema   Data Reviewed: Basic Metabolic Panel:  Recent Labs Lab 01/08/14 0403 01/09/14 0430 01/10/14 0443 01/11/14 0534 01/11/14 0551 01/13/14 1405 01/14/14 0350  NA 125* 131* 132* 132*  --  129* 131*  K 3.2* 3.9 4.8 4.2  --  4.8 4.1  CL 84* 94* 97 96  --  90* 96  CO2 --  22 22  GLUCOSE 91 101* 99 94  --  117* 98  BUN 71* 43* 24* 14  --  36* 33*  CREATININE 3.69* 1.88* 1.23* 1.12*  --  2.37* 1.87*  CALCIUM 8.2* 8.2* 8.4 8.7  --  10.0 8.4  MG  --  2.4  --   --  1.5  --   --   PHOS  --  2.4 1.9* 3.8  --   --   --    Liver Function Tests:  Recent Labs Lab 01/09/14 0430  01/10/14 0443 01/11/14 0534  ALBUMIN 2.6* 2.8* 2.8*   No results found for this basename: LIPASE, AMYLASE,  in the last 168 hours No results found for this basename: AMMONIA,  in the last 168 hours CBC:  Recent Labs Lab 01/08/14 0403 01/13/14 1405 01/14/14 0350  WBC 8.0 14.4* 9.5  NEUTROABS  --  9.6*  --   HGB 9.5* 12.2 8.8*  HCT 26.1* 35.2* 25.2*  MCV 85.0 90.0 90.3  PLT 204 319 265   Cardiac Enzymes: No results found for this basename: CKTOTAL, CKMB, CKMBINDEX, TROPONINI,  in the last 168 hours BNP (last 3 results)  Recent Labs  01/07/14 1631  PROBNP 592.6*   CBG: No results found  for this basename: GLUCAP,  in the last 168 hours  Recent Results (from the past 240 hour(s))  URINE CULTURE     Status: None   Collection Time    01/07/14  5:50 PM      Result Value Ref Range Status   Specimen Description URINE, CATHETERIZED   Final   Special Requests NONE   Final   Culture  Setup Time     Final   Value: 01/08/2014 00:09     Performed at [facility] Count     Final   Value: NO GROWTH     Performed at [facility]   Culture     Final   Value: NO GROWTH     Performed at [facility]   Report Status 01/08/2014 FINAL   Final  CLOSTRIDIUM DIFFICILE BY PCR     Status: None   Collection Time    01/08/14  1:34 AM      Result Value Ref Range Status   C difficile by pcr NEGATIVE  NEGATIVE Final   Comment: Performed at [facility]     Studies: No results found.  Scheduled Meds: . aspirin EC  81 mg Oral Daily  . bisacodyl  10 mg Rectal Once  . cefTRIAXone (ROCEPHIN)  IV  1 g Intravenous Q24H  . enoxaparin (LOVENOX) injection  30 mg Subcutaneous Q24H  . feeding supplement (ENSURE)  1 Container Oral BID WC  . FLUoxetine  10 mg Oral Daily  . multivitamin  5 mL Oral Daily  . naphazoline  1 drop Both Eyes BID  . pantoprazole  40 mg Oral Daily   Continuous Infusions: . sodium chloride    . sodium chloride 75 mL/hr (01/13/14 1907)    Principal Problem:   Acute renal failure Active Problems:   Hyponatremia   Edema of both legs   Urinary retention   Acute kidney injury   HTN (hypertension)   Leukocytosis, unspecified   Unspecified constipation   Decubitus ulcer limited to breakdown of skin (stage 2)   Obesity (BMI 30-39.9)    Time spent: *25 minutes    [facility]  [facility] Pager [phone]. If 7PM-7AM, please contact night-coverage at [email or web address], password [facility] 01/14/2014, 4:33 PM  LOS: 1 day

## 2014-01-14 NOTE — Evaluation (Signed)
Physical Therapy Evaluation Patient Details Name: Elizabeth Jarvis MRN: [id number] DOB: 1921-08-22 Today's Date: 01/14/2014   History of Present Illness  Pt is a 80 year old female with hx of GERD and chronic LE edema admitted 01/07/14 with acute renal failure most likely is secondary to obstructive uropathy.  Clinical Impression  On eval, pt required Min assist for mobility-able to ambulate ~15 feet in room with walker. Pt incontinent of bowels during session-total assist for hygiene. No family present during session. Recommend HHPT.     Follow Up Recommendations Home health PT;Supervision/Assistance - 24 hour    Equipment Recommendations  None recommended by PT    Recommendations for Other Services OT consult     Precautions / Restrictions Precautions Precautions: Fall Restrictions Weight Bearing Restrictions: No      Mobility  Bed Mobility Overal bed mobility: Needs Assistance Bed Mobility: Supine to Sit     Supine to sit: Min assist        Transfers Overall transfer level: Needs assistance Equipment used: Rolling walker (2 wheeled) Transfers: Sit to/from Stand Sit to Stand: Min assist            Ambulation/Gait Ambulation/Gait assistance: Min assist Ambulation Distance (Feet): 15 Feet Assistive device: Rolling walker (2 wheeled) Gait Pattern/deviations: Step-through pattern;Decreased stride length        Stairs            Wheelchair Mobility    Modified Rankin (Stroke Patients Only)       Balance                                             Pertinent Vitals/Pain Pain Assessment: Faces Faces Pain Scale: Hurts a little bit Pain Location: "all over" Pain Intervention(s): Monitored during session    Home Living Family/patient expects to be discharged to:: Private residence Living Arrangements: Children;Other relatives Available Help at Discharge: Available PRN/intermittently Type of Home: House Home Access: Stairs to  enter Entrance Stairs-Rails: None Entrance Stairs-Number of Steps: 1 Home Layout: One level Home Equipment: Walker - 2 wheels;Toilet riser;Walker - 4 wheels;Shower seat      Prior Function Level of Independence: Needs assistance               Hand Dominance        Extremity/Trunk Assessment   Upper Extremity Assessment: Generalized weakness           Lower Extremity Assessment: Generalized weakness;RLE deficits/detail;LLE deficits/detail RLE Deficits / Details: edema LLE Deficits / Details: edema  Cervical / Trunk Assessment: Kyphotic  Communication   Communication: HOH  Cognition Arousal/Alertness: Awake/alert Behavior During Therapy: WFL for tasks assessed/performed Overall Cognitive Status: Within Functional Limits for tasks assessed                      General Comments      Exercises        Assessment/Plan    PT Assessment Patient needs continued PT services  PT Diagnosis Difficulty walking;Generalized weakness   PT Problem List Decreased strength;Decreased activity tolerance;Decreased balance;Decreased mobility;Decreased knowledge of use of DME;Pain  PT Treatment Interventions DME instruction;Gait training;Functional mobility training;Therapeutic activities;Patient/family education;Balance training;Therapeutic exercise   PT Goals (Current goals can be found in the Care Plan section) Acute Rehab PT Goals Patient Stated Goal: none stated PT Goal Formulation: With patient Time For Goal Achievement: 01/28/14 Potential to Achieve  Goals: Good    Frequency Min 3X/week   Barriers to discharge        Co-evaluation               End of Session   Activity Tolerance: Patient tolerated treatment well Patient left: in chair;with call bell/phone within reach;with chair alarm set           Time: 7829-5621 PT Time Calculation (min): 18 min   Charges:   PT Evaluation $Initial PT Evaluation Tier I: 1 Procedure PT Treatments $Gait  Training: 8-22 mins   PT G Codes:          Rebeca Alert, MPT Pager: [phone]

## 2014-01-15 DIAGNOSIS — I10 Essential (primary) hypertension: Secondary | ICD-10-CM

## 2014-01-15 LAB — COMPREHENSIVE METABOLIC PANEL
ALBUMIN: 2.6 g/dL — AB (ref 3.5–5.2)
ALK PHOS: 64 U/L (ref 39–117)
ALT: 10 U/L (ref 0–35)
AST: 16 U/L (ref 0–37)
Anion gap: 11 (ref 5–15)
BUN: 19 mg/dL (ref 6–23)
CHLORIDE: 101 meq/L (ref 96–112)
CO2: 22 mEq/L (ref 19–32)
Calcium: 8.6 mg/dL (ref 8.4–10.5)
Creatinine, Ser: 1.22 mg/dL — ABNORMAL HIGH (ref 0.50–1.10)
GFR calc Af Amer: 44 mL/min — ABNORMAL LOW (ref >90)
GFR calc non Af Amer: 38 mL/min — ABNORMAL LOW (ref >90)
Glucose, Bld: 92 mg/dL (ref 70–99)
POTASSIUM: 4.2 meq/L (ref 3.7–5.3)
Sodium: 134 mEq/L — ABNORMAL LOW (ref 137–147)
TOTAL PROTEIN: 6.4 g/dL (ref 6.0–8.3)
Total Bilirubin: 0.5 mg/dL (ref 0.3–1.2)

## 2014-01-15 LAB — CBC
HCT: 28.1 % — ABNORMAL LOW (ref 36.0–46.0)
HEMOGLOBIN: 9.6 g/dL — AB (ref 12.0–15.0)
MCH: 31.2 pg (ref 26.0–34.0)
MCHC: 34.2 g/dL (ref 30.0–36.0)
MCV: 91.2 fL (ref 78.0–100.0)
Platelets: 284 10*3/uL (ref 150–400)
RBC: 3.08 MIL/uL — ABNORMAL LOW (ref 3.87–5.11)
RDW: 12.9 % (ref 11.5–15.5)
WBC: 8.3 10*3/uL (ref 4.0–10.5)

## 2014-01-15 MED ORDER — HYDROCODONE-ACETAMINOPHEN 5-325 MG PO TABS: 1.0000 | ORAL_TABLET | Freq: Four times a day (QID) | ORAL | Status: DC | PRN

## 2014-01-15 MED ORDER — SENNA 8.6 MG PO TABS: 1.0000 | ORAL_TABLET | Freq: Every day | ORAL | Status: DC | PRN

## 2014-01-15 MED ORDER — POLYETHYLENE GLYCOL 3350 17 G PO PACK
17.0000 g | PACK | Freq: Every day | ORAL | Status: DC
  Administered 2014-01-15: 17 g via ORAL
  Filled 2014-01-15: qty 1

## 2014-01-15 MED ORDER — AMLODIPINE BESYLATE 5 MG PO TABS
5.0000 mg | ORAL_TABLET | Freq: Every day | ORAL | Status: DC
  Administered 2014-01-15 – 2014-01-16 (×2): 5 mg via ORAL
  Filled 2014-01-15 (×2): qty 1

## 2014-01-15 NOTE — Progress Notes (Signed)
Assumed care of patient. No changes in am assessment at this time. Cont with plan of care

## 2014-01-15 NOTE — Progress Notes (Signed)
Physical Therapy Treatment Patient Details Name: Elizabeth Jarvis MRN: [id number] DOB: [birth year] Today's Date: 01/15/2014    History of Present Illness Pt is a 80 year old female with hx of GERD and chronic LE edema admitted 01/07/14 with acute renal failure most likely is secondary to obstructive uropathy.    PT Comments    Progressing slowly with mobility. Pt tolerated activity fairly well. Declined ambulation into hallway. Dyspnea 2/4 with activity.   Follow Up Recommendations  Home health PT;Supervision/Assistance - 24 hour     Equipment Recommendations  None recommended by PT    Recommendations for Other Services OT consult     Precautions / Restrictions Precautions Precautions: Fall Restrictions Weight Bearing Restrictions: No    Mobility  Bed Mobility               General bed mobility comments: pt sitting in recliner  Transfers Overall transfer level: Needs assistance Equipment used: Rolling walker (2 wheeled) Transfers: Sit to/from Stand Sit to Stand: Min assist         General transfer comment: assist to rise, stabilize, control descent.   Ambulation/Gait Ambulation/Gait assistance: Min assist Ambulation Distance (Feet): 20 Feet Assistive device: Rolling walker (2 wheeled) Gait Pattern/deviations: Step-through pattern;Decreased stride length;Trunk flexed     General Gait Details: slow gait speed. Pt declined to ambulate into hallway. assist to stabilize.    Stairs            Wheelchair Mobility    Modified Rankin (Stroke Patients Only)       Balance                                    Cognition Arousal/Alertness: Awake/alert Behavior During Therapy: WFL for tasks assessed/performed Overall Cognitive Status: Within Functional Limits for tasks assessed                      Exercises General Exercises - Lower Extremity Ankle Circles/Pumps: AROM;Both;15 reps;Seated Long Arc Quad: AROM;Both;15 reps;Seated Hip  Flexion/Marching: AROM;Both;10 reps;Seated    General Comments        Pertinent Vitals/Pain Pain Assessment: Faces Faces Pain Scale: Hurts a little bit Pain Location: all over Pain Intervention(s): Limited activity within patient's tolerance;Monitored during session    Home Living                      Prior Function            PT Goals (current goals can now be found in the care plan section) Progress towards PT goals: Progressing toward goals    Frequency  Min 3X/week    PT Plan Current plan remains appropriate    Co-evaluation             End of Session Equipment Utilized During Treatment: Gait belt Activity Tolerance: Patient tolerated treatment well Patient left: in chair;with call bell/phone within reach;with chair alarm set     Time: 5284-1324 PT Time Calculation (min): 23 min  Charges:  $Gait Training: 8-22 mins $Therapeutic Exercise: 8-22 mins                    G Codes:      Rebeca Alert, MPT Pager: [phone]

## 2014-01-15 NOTE — Progress Notes (Addendum)
[facility] PROGRESS NOTE  Elizabeth Jarvis QVZ:[id number] DOB: 1921-10-05 DOA: 01/13/2014 PCP: No primary provider on file.  Assessment/Plan: 1. Acute renal failure- secondary to obstructive uropathy, Foley catheter in place. Improving. Creatinine is down to 1.22 2. Bilateral hydronephrosis- CT abdomen pelvis on 01/07/2014 showed over dilation of the bladder with associated hydronephrosis. Urology has seen the patient and recommend renal ultrasound before discharge. 3. Hypertension- patient has persistent  elevation of the blood pressure, she's not on any antihypertensive regimen at home.  Will start Amlodpine 5 mg po daily, Will continue to monitor the patient's blood pressure. Start hydralazine 25 mg by mouth every 6 hours when necessary for BP greater than 160/100. 4. Hyponatremia- slowly improving, per her sodium was 131. We'll continue to monitor. 5. Bilateral lower extremity edema- 2-D echocardiogram has been ordered. Albumin level is 2.8. 6. Decubitus ulcer stage II- wound care following  Code Status: DNR Family Communication: *No family at bedside Disposition Plan: Home when stable   Consultants:  None  Procedures:  None  Antibiotics:  Rocephin  HPI/Subjective: 80 y.o. female with past medical history of hypertension and obesity who was discharged 2 days prior from the hospitalist service on 9/6 after episode of urinary retention and acute renal failure. By day of discharge, patient was able to void on her own and her creatinine was 1.12. Since going home, the patient has been unable to void as well as have bowel movements and she came back into the emergency room today, brought in by her son. Patient's creatinine was noted to be 2.37 and her white count was 14. The emergency room was able to place a Foley catheter and patient voided 2.6 L. Hospitalists were called for further evaluation and admission. Patient was given a dose of IV Rocephin.  Patient feels better today,  foley catheter in place. Renal function is slowly improving.   Objective: Filed Vitals:   01/15/14 1332  BP: 176/80  Pulse: 86  Temp: 98.2 F (36.8 C)  Resp: 16    Intake/Output Summary (Last 24 hours) at 01/15/14 1554 Last data filed at 01/15/14 1505  Gross per 24 hour  Intake   1670 ml  Output   1975 ml  Net   -305 ml   Filed Weights   01/13/14 1730  Weight: 75.3 kg (166 lb 0.1 oz)    Exam:  Physical Exam: Head: Normocephalic, atraumatic.  Neck: supple,No deformities, masses, or tenderness noted. Lungs: Normal respiratory effort. B/L Clear to auscultation, no crackles or wheezes.  Heart: Regular RR. S1 and S2 normal  Abdomen: BS normoactive. Soft, Nondistended, non-tender.  Extremities: Bilateral 1+ pitting edema   Data Reviewed: Basic Metabolic Panel:  Recent Labs Lab 01/09/14 0430 01/10/14 0443 01/11/14 0534 01/11/14 0551 01/13/14 1405 01/14/14 0350 01/15/14 0415  NA 131* 132* 132*  --  129* 131* 134*  K 3.9 4.8 4.2  --  4.8 4.1 4.2  CL 94* 97 96  --  90* 96 101  CO2 --  GLUCOSE 101* 99 94  --  117* 98 92  BUN 43* 24* 14  --  36* 33* 19  CREATININE 1.88* 1.23* 1.12*  --  2.37* 1.87* 1.22*  CALCIUM 8.2* 8.4 8.7  --  10.0 8.4 8.6  MG 2.4  --   --  1.5  --   --   --   PHOS 2.4 1.9* 3.8  --   --   --   --  Liver Function Tests:  Recent Labs Lab 01/09/14 0430 01/10/14 0443 01/11/14 0534 01/15/14 0415  AST  --   --   --  16  ALT  --   --   --  10  ALKPHOS  --   --   --  64  BILITOT  --   --   --  0.5  PROT  --   --   --  6.4  ALBUMIN 2.6* 2.8* 2.8* 2.6*   No results found for this basename: LIPASE, AMYLASE,  in the last 168 hours No results found for this basename: AMMONIA,  in the last 168 hours CBC:  Recent Labs Lab 01/13/14 1405 01/14/14 0350 01/15/14 0415  WBC 14.4* 9.5 8.3  NEUTROABS 9.6*  --   --   HGB 12.2 8.8* 9.6*  HCT 35.2* 25.2* 28.1*  MCV 90.0 90.3 91.2  PLT 319 265 284   Cardiac Enzymes: No  results found for this basename: CKTOTAL, CKMB, CKMBINDEX, TROPONINI,  in the last 168 hours BNP (last 3 results)  Recent Labs  01/07/14 1631  PROBNP 592.6*   CBG: No results found for this basename: GLUCAP,  in the last 168 hours  Recent Results (from the past 240 hour(s))  URINE CULTURE     Status: None   Collection Time    01/07/14  5:50 PM      Result Value Ref Range Status   Specimen Description URINE, CATHETERIZED   Final   Special Requests NONE   Final   Culture  Setup Time     Final   Value: 01/08/2014 00:09     Performed at [facility] Count     Final   Value: NO GROWTH     Performed at [facility]   Culture     Final   Value: NO GROWTH     Performed at [facility]   Report Status 01/08/2014 FINAL   Final  CLOSTRIDIUM DIFFICILE BY PCR     Status: None   Collection Time    01/08/14  1:34 AM      Result Value Ref Range Status   C difficile by pcr NEGATIVE  NEGATIVE Final   Comment: Performed at [facility]  URINE CULTURE     Status: None   Collection Time    01/13/14  1:54 PM      Result Value Ref Range Status   Specimen Description URINE, CATHETERIZED   Final   Special Requests NONE   Final   Culture  Setup Time     Final   Value: 01/14/2014 00:07     Performed at [facility] Count     Final   Value: >=100,000 COLONIES/ML     Performed at [facility]   Culture     Final   Value: ESCHERICHIA COLI     Performed at [facility]   Report Status PENDING   Incomplete     Studies: No results found.  Scheduled Meds: . aspirin EC  81 mg Oral Daily  . bisacodyl  10 mg Rectal Once  . cefTRIAXone (ROCEPHIN)  IV  1 g Intravenous Q24H  . enoxaparin (LOVENOX) injection  30 mg Subcutaneous Q24H  . feeding supplement (ENSURE)  1 Container Oral BID WC  . FLUoxetine  10 mg Oral Daily  . multivitamin  5 mL Oral Daily  . naphazoline  1 drop Both Eyes BID  .  pantoprazole  40 mg Oral Daily   . polyethylene glycol  17 g Oral Daily   Continuous Infusions: . sodium chloride    . sodium chloride 75 mL/hr at 01/15/14 0416    Principal Problem:   Acute renal failure Active Problems:   Hyponatremia   Edema of both legs   Urinary retention   Acute kidney injury   HTN (hypertension)   Leukocytosis, unspecified   Unspecified constipation   Decubitus ulcer limited to breakdown of skin (stage 2)   Obesity (BMI 30-39.9)    Time spent: *25 minutes    [facility]  [facility] Pager [phone]. If 7PM-7AM, please contact night-coverage at [email or web address], password [facility] 01/15/2014, 3:54 PM  LOS: 2 days

## 2014-01-16 ENCOUNTER — Inpatient Hospital Stay (HOSPITAL_COMMUNITY): Payer: Medicare Other

## 2014-01-16 DIAGNOSIS — N390 Urinary tract infection, site not specified: Secondary | ICD-10-CM

## 2014-01-16 LAB — BASIC METABOLIC PANEL
Anion gap: 13 (ref 5–15)
BUN: 11 mg/dL (ref 6–23)
CALCIUM: 9 mg/dL (ref 8.4–10.5)
CHLORIDE: 99 meq/L (ref 96–112)
CO2: 22 meq/L (ref 19–32)
CREATININE: 1.03 mg/dL (ref 0.50–1.10)
GFR calc Af Amer: 54 mL/min — ABNORMAL LOW (ref >90)
GFR calc non Af Amer: 46 mL/min — ABNORMAL LOW (ref >90)
GLUCOSE: 96 mg/dL (ref 70–99)
Potassium: 4.3 mEq/L (ref 3.7–5.3)
Sodium: 134 mEq/L — ABNORMAL LOW (ref 137–147)

## 2014-01-16 LAB — URINE CULTURE: Colony Count: >=100000

## 2014-01-16 MED ORDER — ENOXAPARIN SODIUM 40 MG/0.4ML ~~LOC~~ SOLN
40.0000 mg | SUBCUTANEOUS | Status: DC
  Filled 2014-01-16: qty 0.4

## 2014-01-16 MED ORDER — CEPHALEXIN 500 MG PO CAPS: 500.0000 mg | ORAL_CAPSULE | Freq: Two times a day (BID) | ORAL | Status: DC

## 2014-01-16 MED ORDER — AMLODIPINE BESYLATE 5 MG PO TABS: 5.0000 mg | ORAL_TABLET | Freq: Every day | ORAL | Status: DC

## 2014-01-16 NOTE — Discharge Summary (Addendum)
Physician Discharge Summary  Elizabeth Jarvis WJX:[id number] DOB: [birth year] DOA: 01/13/2014  PCP: No primary provider on file.  Admit date: 01/13/2014 Discharge date: 01/16/2014  Time spent: 50* minutes  Recommendations for Outpatient Follow-up:  1. *Follow up Urology in 2 weeks  Discharge Diagnoses:  Principal Problem:   Acute renal failure Active Problems:   Hyponatremia   Edema of both legs   Urinary retention   Acute kidney injury   HTN (hypertension)   Leukocytosis, unspecified   Unspecified constipation   Decubitus ulcer limited to breakdown of skin (stage 2)   Obesity (BMI 30-39.9)   Discharge Condition: Stable  Diet recommendation: Regular diet  Filed Weights   01/13/14 1730  Weight: 75.3 kg (166 lb 0.1 oz)    History of present illness:  80 y.o. female  With past medical history of hypertension and obesity who was discharged 2 days prior from the hospitalist service on 9/6 after episode of urinary retention and acute renal failure. By day of discharge, patient was able to void on her own and her creatinine was 1.12. Since going home, the patient has been unable to void as well as have bowel movements and she came back into the emergency room today, brought in by her son. Patient's creatinine was noted to be 2.37 and her white count was 14. The emergency room was able to place a Foley catheter and patient voided 2.6 L. Hospitalists were called for further evaluation and admission. Patient was given a dose of IV Rocephin   Hospital Course:  1. Acute renal failure- secondary to obstructive uropathy, Foley catheter in place. Improving. Creatinine is down to 1.03 2. Bilateral hydronephrosis- CT abdomen pelvis on 01/07/2014 showed over dilation of the bladder with associated hydronephrosis. Renal ultrasound done today shows almost complete resolution of the hydronephrosis.Patient will be discharged with foley catheter. Follow up Urology in 2 weeks 3. Hypertension- patient had  persistent elevation of the blood pressure, she's not on any antihypertensive regimen at home. Was  started Amlodpine 5 mg po daily, BP improved after starting the medication. Will discharge on po amlodipine 5 mg daily. 4. Hyponatremia-  Resolved. 5. Bilateral lower extremity edema- 2-D echocardiogram showed EF 55-60%. Albumin level is 2.8. Continue prn lasix. 6. UTI- urine culture grew E coli, sensitive to rocephin and cefazolin. Will discharge on Po keflex 500 mmhg q 12 hr for four more days for total 7 days of therapy.  Procedures:  None  Consultations:  Urology  Discharge Exam: Filed Vitals:   01/16/14 1400  BP: 137/66  Pulse: 92  Temp: 97.7 F (36.5 C)  Resp: 18    General: Appear in no acute distress Cardiovascular: S1s2 RRR Respiratory: Clear bilaterally  Discharge Instructions You were cared for by a hospitalist during your hospital stay. If you have any questions about your discharge medications or the care you received while you were in the hospital after you are discharged, you can call the unit and asked to speak with the hospitalist on call if the hospitalist that took care of you is not available. Once you are discharged, your primary care physician will handle any further medical issues. Please note that NO REFILLS for any discharge medications will be authorized once you are discharged, as it is imperative that you return to your primary care physician (or establish a relationship with a primary care physician if you do not have one) for your aftercare needs so that they can reassess your need for medications and monitor your lab  values.  Discharge Instructions   Diet - low sodium heart healthy    Complete by:  As directed      Increase activity slowly    Complete by:  As directed           Current Discharge Medication List    START taking these medications   Details  amLODipine (NORVASC) 5 MG tablet Take 1 tablet (5 mg total) by mouth daily. Qty: 30 tablet,  Refills: 2    cephALEXin (KEFLEX) 500 MG capsule Take 1 capsule (500 mg total) by mouth 2 (two) times daily. Qty: 8 capsule, Refills: 0      CONTINUE these medications which have NOT CHANGED   Details  aspirin EC 81 MG tablet Take 81 mg by mouth daily.    b complex vitamins tablet Take 1 tablet by mouth daily.    calcium-vitamin D (OSCAL WITH D) 500-200 MG-UNIT per tablet Take 1 tablet by mouth daily with breakfast.    furosemide (LASIX) 20 MG tablet Take 20 mg by mouth daily as needed for fluid.    omeprazole (PRILOSEC) 20 MG capsule Take 20 mg by mouth daily.    tetrahydrozoline (VISINE) 0.05 % ophthalmic solution Place 1 drop into both eyes 2 (two) times daily.    Zinc Oxide (DESITIN MAXIMUM STRENGTH) 40 % PSTE Apply to affected area 3 times a day Qty: 28 g, Refills: 2    docusate sodium (COLACE) 100 MG capsule Take 100 mg by mouth daily as needed for mild constipation.    FLUoxetine (PROZAC) 10 MG capsule Take 1 capsule (10 mg total) by mouth daily. Qty: 30 capsule, Refills: 0       No Known Allergies Follow-up Information   Follow up with Beverley Fiedler, MD On 01/20/2014. (You have been schedule an appointment at [facility] per your request for a closer family doctor Please attend this appointment on 01/20/14 at 2 pm wiht Dr Barbaraann Barthel or call to reschedule if needed )    Specialty:  Family Medicine   Contact information:   [address] [place] [address] [phone]       Schedule an appointment as soon as possible for a visit with Nils Flack, MD. (A referral has been sent on 01/13/14 to this dermatologist per your request for a dermatologist in zip [phone]. this Dermatologist needs to be called with an updated cell number and to confirm the date and time of appointment for you )    Specialty:  Dermatology   Contact information:   [address] [place] [address] [phone]        The results of significant  diagnostics from this hospitalization (including imaging, microbiology, ancillary and laboratory) are listed below for reference.    Significant Diagnostic Studies: Ct Abdomen Pelvis Wo Contrast  01/07/2014   CLINICAL DATA:  Bilateral leg swelling  EXAM: CT ABDOMEN AND PELVIS WITHOUT CONTRAST  TECHNIQUE: Multidetector CT imaging of the abdomen and pelvis was performed following the standard protocol without IV contrast.  COMPARISON:  None.  FINDINGS: Lung bases are free of acute infiltrate or sizable effusion.  The liver, gallbladder, spleen, adrenal glands and pancreas are within normal limits.  The kidneys are well visualized bilaterally and reveal no evidence of renal calculi. Fullness of the collecting systems is noted bilaterally. The bladder is significantly distended. No definitive calculi are seen. This raises suspicion for urinary retention and chronic hydronephrosis. The appendix is within normal limits. Mild diverticular change is  seen without diverticulitis. The osseous structures show chronic compression deformities of L2 and T12. Degenerative changes of lumbar spine are noted as well.  IMPRESSION: Over dilatation of the bladder with associated hydronephrosis. No definitive obstructing lesions are seen and this is felt to be related to the over distension of the bladder.  No other acute abnormality is noted.   Electronically Signed   By: Alcide Clever M.D.   On: 01/07/2014 19:16   US Renal  01/16/2014   CLINICAL DATA:  Recent hydronephrosis noted on CT  EXAM: RENAL ULTRASOUND  COMPARISON:  CT abdomen and pelvis January 07, 2014  FINDINGS: Right Kidney:  Length: 9.1 cm. Echogenicity and renal cortical thickness are within normal limits. The recent marked pelvicaliectasis noted on the right has resolved. There is currently minimal fullness of the right renal pelvis and no caliectasis noted. There is no renal mass or perinephric fluid. There is no sonographically demonstrable calculus or  ureterectasis.  Left Kidney:  Length: 8.4 cm. Echogenicity and renal cortical thickness are within normal limits. No mass, pelvicaliectasis, or perinephric fluid visualized. The previous pelvicaliectasis on the left has resolved. No sonographically demonstrable calculus or ureterectasis.  Urinary bladder is decompressed and cannot be assessed.  IMPRESSION: There is now only minimal fullness of the right renal pelvis with no caliceal dilatation. There is no pelvicaliectasis on the left. Study otherwise unremarkable. Note that the urinary bladder is decompressed and cannot be assessed.   Electronically Signed   By: Bretta Bang M.D.   On: 01/16/2014 13:32   Dg Abd Acute W/chest  01/07/2014   CLINICAL DATA:  Right lower abdominal pain.  Swelling in both legs.  EXAM: ACUTE ABDOMEN SERIES (ABDOMEN 2 VIEW & CHEST 1 VIEW)  FINDINGS: Atherosclerotic calcifications are present in the aortic arch. Heart size normal. Mild interstitial coarsening is chronic.  Fluid levels are present within nondilated loops of small bowel and the right colon. There is no obstruction. There is no free air.  IMPRESSION: 1. Fluid levels within nondilated loops of large and small bowel suggesting an ileus. 2. No evidence for obstruction or free air. 3. Atherosclerosis. 4. Interstitial coarsening is likely chronic.   Electronically Signed   By: Gennette Pac M.D.   On: 01/07/2014 17:12    Microbiology: Recent Results (from the past 240 hour(s))  URINE CULTURE     Status: None   Collection Time    01/07/14  5:50 PM      Result Value Ref Range Status   Specimen Description URINE, CATHETERIZED   Final   Special Requests NONE   Final   Culture  Setup Time     Final   Value: 01/08/2014 00:09     Performed at [facility] Count     Final   Value: NO GROWTH     Performed at [facility]   Culture     Final   Value: NO GROWTH     Performed at [facility]   Report Status 01/08/2014 FINAL   Final   CLOSTRIDIUM DIFFICILE BY PCR     Status: None   Collection Time    01/08/14  1:34 AM      Result Value Ref Range Status   C difficile by pcr NEGATIVE  NEGATIVE Final   Comment: Performed at [facility]  URINE CULTURE     Status: None   Collection Time    01/13/14  1:54 PM  Result Value Ref Range Status   Specimen Description URINE, CATHETERIZED   Final   Special Requests NONE   Final   Culture  Setup Time     Final   Value: 01/14/2014 00:07     Performed at [facility]   Colony Count     Final   Value: >=100,000 COLONIES/ML     Performed at [facility]   Culture     Final   Value: ESCHERICHIA COLI     Performed at [facility]   Report Status 01/16/2014 FINAL   Final   Organism ID, Bacteria ESCHERICHIA COLI   Final     Labs: Basic Metabolic Panel:  Recent Labs Lab 01/10/14 0443 01/11/14 0534 01/11/14 0551 01/13/14 1405 01/14/14 0350 01/15/14 0415 01/16/14 0416  NA 132* 132*  --  129* 131* 134* 134*  K 4.8 4.2  --  4.8 4.1 4.2 4.3  CL 97 96  --  90* 96 101 99  CO2 24 24  --  GLUCOSE 99 94  --  117* 98 92 96  BUN 24* 14  --  36* 33* 19 11  CREATININE 1.23* 1.12*  --  2.37* 1.87* 1.22* 1.03  CALCIUM 8.4 8.7  --  10.0 8.4 8.6 9.0  MG  --   --  1.5  --   --   --   --   PHOS 1.9* 3.8  --   --   --   --   --    Liver Function Tests:  Recent Labs Lab 01/10/14 0443 01/11/14 0534 01/15/14 0415  AST  --   --  16  ALT  --   --  10  ALKPHOS  --   --  64  BILITOT  --   --  0.5  PROT  --   --  6.4  ALBUMIN 2.8* 2.8* 2.6*   No results found for this basename: LIPASE, AMYLASE,  in the last 168 hours No results found for this basename: AMMONIA,  in the last 168 hours CBC:  Recent Labs Lab 01/13/14 1405 01/14/14 0350 01/15/14 0415  WBC 14.4* 9.5 8.3  NEUTROABS 9.6*  --   --   HGB 12.2 8.8* 9.6*  HCT 35.2* 25.2* 28.1*  MCV 90.0 90.3 91.2  PLT 319 265 284   Cardiac Enzymes: No results found for this  basename: CKTOTAL, CKMB, CKMBINDEX, TROPONINI,  in the last 168 hours BNP: BNP (last 3 results)  Recent Labs  01/07/14 1631  PROBNP 592.6*   CBG: No results found for this basename: GLUCAP,  in the last 168 hours     Signed:  Colene Mines S  [facility] 01/16/2014, 2:53 PM

## 2014-01-16 NOTE — Progress Notes (Signed)
Discharge to home, son at bedside d/c with foley cathter , son stated that he knows how to  Take care of the Foley , [facility] care reinforced.  Pressure ulcer dressing changed prior to discharged. D/c instructions and follow up appointments done and was given to the son, verbalized understanding. PIV removed by NT, no s/s of infiltration or swelling noted on insertion site.

## 2014-01-16 NOTE — Progress Notes (Signed)
CARE MANAGEMENT NOTE 01/16/2014  Patient:  GREYSON, RICCARDI   Account Number:  [id number]  Date Initiated:  01/16/2014  Documentation initiated by:  Ezekiel Ina  Subjective/Objective Assessment:   Pt was admitted with Acut renal failure     Action/Plan:   from home   Anticipated DC Date:  01/18/2014   Anticipated DC Plan:  HOME W HOME HEALTH SERVICES  In-house referral  Clinical Social Worker      DC Planning Services  CM consult      Choice offered to / List presented to:          [facility] arranged  HH-1 RN  HH-2 PT  HH-4 NURSE'S AIDE      HH agency  [facility]   Status of service:  In process, will continue to follow Medicare Important Message given?  YES (If response is "NO", the following Medicare IM given date fields will be blank) Date Medicare IM given:  01/16/2014 Medicare IM given by:  Ezekiel Ina Date Additional Medicare IM given:   Additional Medicare IM given by:    Discharge Disposition:    Per UR Regulation:  Reviewed for med. necessity/level of care/duration of stay  If discussed at Long Length of Stay Meetings, dates discussed:    Comments:  01/16/14 MMcGibboney, RN,BSN Pt was active with [facility] and will continue with them at discharge. Will need resumption of HH orders.

## 2014-01-16 NOTE — Progress Notes (Signed)
Physical Therapy Treatment Patient Details Name: Elizabeth Jarvis MRN: [id number] DOB: 29-Oct-1921 Today's Date: 01/16/2014    History of Present Illness Pt is a 80 year old female with hx of GERD and chronic LE edema admitted 01/07/14 with acute renal failure most likely is secondary to obstructive uropathy.    PT Comments    Pt OOB in recliner.  Pt speaks broken [place].  Assisted out of recliner to amb to bathroom.  Assisted in bathroom then amb limited distance in hallway when pt stated "that's enough".  Good steady gait just needed cueing for proper walker use as pt appears to be a "furniture walker".    Follow Up Recommendations  Home health PT;Supervision/Assistance - 24 hour     Equipment Recommendations       Recommendations for Other Services       Precautions / Restrictions Precautions Precautions: Fall    Mobility  Bed Mobility               General bed mobility comments: pt sitting in recliner  Transfers Overall transfer level: Needs assistance Equipment used: Rolling walker (2 wheeled) Transfers: Sit to/from Stand Sit to Stand: Min guard         General transfer comment: cueing for safety using RW  Ambulation/Gait Ambulation/Gait assistance: Min guard Ambulation Distance (Feet): 45 Feet Assistive device: Rolling walker (2 wheeled) Gait Pattern/deviations: Step-through pattern;Trunk flexed Gait velocity: decreased   General Gait Details: slow shuffed gait.  Amb from recliner to bathroom with MAX encouragement.  Amb in hallway another 25 feet when pt stated "that's enough".  Recliner brought to pt.  otherwise, good steady gait.   Stairs            Wheelchair Mobility    Modified Rankin (Stroke Patients Only)       Balance                                    Cognition                            Exercises      General Comments        Pertinent Vitals/Pain      Home Living                       Prior Function            PT Goals (current goals can now be found in the care plan section) Progress towards PT goals: Progressing toward goals    Frequency  Min 3X/week    PT Plan      Co-evaluation             End of Session Equipment Utilized During Treatment: Gait belt Activity Tolerance: Patient tolerated treatment well Patient left: in chair;with call bell/phone within reach;with chair alarm set     Time: 1140-1210 PT Time Calculation (min): 30 min  Charges:  $Gait Training: 8-22 mins $Therapeutic Activity: 8-22 mins                    G Codes:      Felecia Shelling  PTA [facility]  Acute  Rehab Pager      [phone]

## 2014-01-20 DIAGNOSIS — R609 Edema, unspecified: Secondary | ICD-10-CM | POA: Diagnosis not present

## 2014-01-20 DIAGNOSIS — Z466 Encounter for fitting and adjustment of urinary device: Secondary | ICD-10-CM | POA: Diagnosis not present

## 2014-01-20 DIAGNOSIS — N390 Urinary tract infection, site not specified: Secondary | ICD-10-CM | POA: Diagnosis not present

## 2014-01-20 DIAGNOSIS — L89309 Pressure ulcer of unspecified buttock, unspecified stage: Secondary | ICD-10-CM | POA: Diagnosis not present

## 2014-01-20 DIAGNOSIS — R21 Rash and other nonspecific skin eruption: Secondary | ICD-10-CM | POA: Diagnosis not present

## 2014-01-20 DIAGNOSIS — R339 Retention of urine, unspecified: Secondary | ICD-10-CM | POA: Diagnosis not present

## 2014-01-20 DIAGNOSIS — L8992 Pressure ulcer of unspecified site, stage 2: Secondary | ICD-10-CM | POA: Diagnosis not present

## 2014-01-22 DIAGNOSIS — R609 Edema, unspecified: Secondary | ICD-10-CM | POA: Diagnosis not present

## 2014-01-22 DIAGNOSIS — L8992 Pressure ulcer of unspecified site, stage 2: Secondary | ICD-10-CM | POA: Diagnosis not present

## 2014-01-22 DIAGNOSIS — Z466 Encounter for fitting and adjustment of urinary device: Secondary | ICD-10-CM | POA: Diagnosis not present

## 2014-01-22 DIAGNOSIS — L89309 Pressure ulcer of unspecified buttock, unspecified stage: Secondary | ICD-10-CM | POA: Diagnosis not present

## 2014-01-22 DIAGNOSIS — N390 Urinary tract infection, site not specified: Secondary | ICD-10-CM | POA: Diagnosis not present

## 2014-01-22 DIAGNOSIS — R339 Retention of urine, unspecified: Secondary | ICD-10-CM | POA: Diagnosis not present

## 2014-01-26 DIAGNOSIS — N390 Urinary tract infection, site not specified: Secondary | ICD-10-CM | POA: Diagnosis not present

## 2014-01-26 DIAGNOSIS — Z466 Encounter for fitting and adjustment of urinary device: Secondary | ICD-10-CM | POA: Diagnosis not present

## 2014-01-26 DIAGNOSIS — R339 Retention of urine, unspecified: Secondary | ICD-10-CM | POA: Diagnosis not present

## 2014-01-26 DIAGNOSIS — L8992 Pressure ulcer of unspecified site, stage 2: Secondary | ICD-10-CM | POA: Diagnosis not present

## 2014-01-26 DIAGNOSIS — L89309 Pressure ulcer of unspecified buttock, unspecified stage: Secondary | ICD-10-CM | POA: Diagnosis not present

## 2014-01-26 DIAGNOSIS — R609 Edema, unspecified: Secondary | ICD-10-CM | POA: Diagnosis not present

## 2014-01-28 DIAGNOSIS — L89309 Pressure ulcer of unspecified buttock, unspecified stage: Secondary | ICD-10-CM | POA: Diagnosis not present

## 2014-01-28 DIAGNOSIS — Z466 Encounter for fitting and adjustment of urinary device: Secondary | ICD-10-CM | POA: Diagnosis not present

## 2014-01-28 DIAGNOSIS — L8992 Pressure ulcer of unspecified site, stage 2: Secondary | ICD-10-CM | POA: Diagnosis not present

## 2014-01-28 DIAGNOSIS — R339 Retention of urine, unspecified: Secondary | ICD-10-CM | POA: Diagnosis not present

## 2014-01-28 DIAGNOSIS — R609 Edema, unspecified: Secondary | ICD-10-CM | POA: Diagnosis not present

## 2014-01-28 DIAGNOSIS — N390 Urinary tract infection, site not specified: Secondary | ICD-10-CM | POA: Diagnosis not present

## 2014-01-30 DIAGNOSIS — N390 Urinary tract infection, site not specified: Secondary | ICD-10-CM | POA: Diagnosis not present

## 2014-01-30 DIAGNOSIS — L89309 Pressure ulcer of unspecified buttock, unspecified stage: Secondary | ICD-10-CM | POA: Diagnosis not present

## 2014-01-30 DIAGNOSIS — L8992 Pressure ulcer of unspecified site, stage 2: Secondary | ICD-10-CM | POA: Diagnosis not present

## 2014-01-30 DIAGNOSIS — R609 Edema, unspecified: Secondary | ICD-10-CM | POA: Diagnosis not present

## 2014-01-30 DIAGNOSIS — R339 Retention of urine, unspecified: Secondary | ICD-10-CM | POA: Diagnosis not present

## 2014-01-30 DIAGNOSIS — Z466 Encounter for fitting and adjustment of urinary device: Secondary | ICD-10-CM | POA: Diagnosis not present

## 2014-02-02 DIAGNOSIS — N390 Urinary tract infection, site not specified: Secondary | ICD-10-CM | POA: Diagnosis not present

## 2014-02-02 DIAGNOSIS — R339 Retention of urine, unspecified: Secondary | ICD-10-CM | POA: Diagnosis not present

## 2014-02-02 DIAGNOSIS — R609 Edema, unspecified: Secondary | ICD-10-CM | POA: Diagnosis not present

## 2014-02-02 DIAGNOSIS — L8992 Pressure ulcer of unspecified site, stage 2: Secondary | ICD-10-CM | POA: Diagnosis not present

## 2014-02-02 DIAGNOSIS — Z466 Encounter for fitting and adjustment of urinary device: Secondary | ICD-10-CM | POA: Diagnosis not present

## 2014-02-02 DIAGNOSIS — L89309 Pressure ulcer of unspecified buttock, unspecified stage: Secondary | ICD-10-CM | POA: Diagnosis not present

## 2014-02-03 DIAGNOSIS — L89309 Pressure ulcer of unspecified buttock, unspecified stage: Secondary | ICD-10-CM | POA: Diagnosis not present

## 2014-02-03 DIAGNOSIS — R609 Edema, unspecified: Secondary | ICD-10-CM | POA: Diagnosis not present

## 2014-02-03 DIAGNOSIS — R339 Retention of urine, unspecified: Secondary | ICD-10-CM | POA: Diagnosis not present

## 2014-02-03 DIAGNOSIS — Z466 Encounter for fitting and adjustment of urinary device: Secondary | ICD-10-CM | POA: Diagnosis not present

## 2014-02-03 DIAGNOSIS — N390 Urinary tract infection, site not specified: Secondary | ICD-10-CM | POA: Diagnosis not present

## 2014-02-03 DIAGNOSIS — L8992 Pressure ulcer of unspecified site, stage 2: Secondary | ICD-10-CM | POA: Diagnosis not present

## 2014-02-04 DIAGNOSIS — R339 Retention of urine, unspecified: Secondary | ICD-10-CM | POA: Diagnosis not present

## 2020-05-30 ENCOUNTER — Encounter (HOSPITAL_COMMUNITY): Payer: Self-pay

## 2020-05-30 ENCOUNTER — Inpatient Hospital Stay (HOSPITAL_COMMUNITY)
Admission: EM | Admit: 2020-05-30 | Discharge: 2020-06-02 | DRG: 812 | Disposition: A | Discharge status: Home / Self Care | Payer: Medicare Other | Attending: Internal Medicine | Admitting: Internal Medicine

## 2020-05-30 ENCOUNTER — Other Ambulatory Visit: Payer: Self-pay

## 2020-05-30 ENCOUNTER — Emergency Department (HOSPITAL_COMMUNITY): Payer: Medicare Other

## 2020-05-30 DIAGNOSIS — Z6832 Body mass index (BMI) 32.0-32.9, adult: Secondary | ICD-10-CM

## 2020-05-30 DIAGNOSIS — K5900 Constipation, unspecified: Secondary | ICD-10-CM | POA: Diagnosis present

## 2020-05-30 DIAGNOSIS — I10 Essential (primary) hypertension: Secondary | ICD-10-CM | POA: Diagnosis present

## 2020-05-30 DIAGNOSIS — N179 Acute kidney failure, unspecified: Secondary | ICD-10-CM | POA: Diagnosis present

## 2020-05-30 DIAGNOSIS — R935 Abnormal findings on diagnostic imaging of other abdominal regions, including retroperitoneum: Secondary | ICD-10-CM | POA: Diagnosis not present

## 2020-05-30 DIAGNOSIS — E860 Dehydration: Secondary | ICD-10-CM | POA: Diagnosis not present

## 2020-05-30 DIAGNOSIS — Z20822 Contact with and (suspected) exposure to covid-19: Secondary | ICD-10-CM | POA: Diagnosis present

## 2020-05-30 DIAGNOSIS — K219 Gastro-esophageal reflux disease without esophagitis: Secondary | ICD-10-CM | POA: Diagnosis not present

## 2020-05-30 DIAGNOSIS — D649 Anemia, unspecified: Secondary | ICD-10-CM | POA: Diagnosis present

## 2020-05-30 DIAGNOSIS — K922 Gastrointestinal hemorrhage, unspecified: Secondary | ICD-10-CM

## 2020-05-30 DIAGNOSIS — R627 Adult failure to thrive: Secondary | ICD-10-CM | POA: Diagnosis present

## 2020-05-30 DIAGNOSIS — K838 Other specified diseases of biliary tract: Secondary | ICD-10-CM | POA: Diagnosis present

## 2020-05-30 DIAGNOSIS — D531 Other megaloblastic anemias, not elsewhere classified: Secondary | ICD-10-CM | POA: Diagnosis not present

## 2020-05-30 DIAGNOSIS — Z7982 Long term (current) use of aspirin: Secondary | ICD-10-CM | POA: Diagnosis not present

## 2020-05-30 DIAGNOSIS — R109 Unspecified abdominal pain: Secondary | ICD-10-CM | POA: Diagnosis not present

## 2020-05-30 DIAGNOSIS — R531 Weakness: Secondary | ICD-10-CM

## 2020-05-30 DIAGNOSIS — R1032 Left lower quadrant pain: Secondary | ICD-10-CM | POA: Diagnosis not present

## 2020-05-30 DIAGNOSIS — H9190 Unspecified hearing loss, unspecified ear: Secondary | ICD-10-CM | POA: Diagnosis present

## 2020-05-30 DIAGNOSIS — K921 Melena: Secondary | ICD-10-CM | POA: Diagnosis not present

## 2020-05-30 DIAGNOSIS — I890 Lymphedema, not elsewhere classified: Secondary | ICD-10-CM | POA: Diagnosis present

## 2020-05-30 DIAGNOSIS — R600 Localized edema: Secondary | ICD-10-CM | POA: Diagnosis present

## 2020-05-30 DIAGNOSIS — D500 Iron deficiency anemia secondary to blood loss (chronic): Principal | ICD-10-CM | POA: Diagnosis present

## 2020-05-30 DIAGNOSIS — R1084 Generalized abdominal pain: Secondary | ICD-10-CM | POA: Diagnosis not present

## 2020-05-30 DIAGNOSIS — Z79899 Other long term (current) drug therapy: Secondary | ICD-10-CM | POA: Diagnosis not present

## 2020-05-30 DIAGNOSIS — K5730 Diverticulosis of large intestine without perforation or abscess without bleeding: Secondary | ICD-10-CM | POA: Diagnosis not present

## 2020-05-30 DIAGNOSIS — N390 Urinary tract infection, site not specified: Secondary | ICD-10-CM | POA: Diagnosis not present

## 2020-05-30 HISTORY — DX: Essential (primary) hypertension: I10

## 2020-05-30 LAB — URINALYSIS, ROUTINE W REFLEX MICROSCOPIC
Bacteria, UA: NONE SEEN
Bilirubin Urine: NEGATIVE
Glucose, UA: NEGATIVE mg/dL
Ketones, ur: NEGATIVE mg/dL
Leukocytes,Ua: NEGATIVE
Nitrite: NEGATIVE
Protein, ur: NEGATIVE mg/dL
Specific Gravity, Urine: 1.039 — ABNORMAL HIGH (ref 1.005–1.030)
pH: 5 (ref 5.0–8.0)

## 2020-05-30 LAB — COMPREHENSIVE METABOLIC PANEL
ALT: 12 U/L (ref 0–44)
AST: 16 U/L (ref 15–41)
Albumin: 3.3 g/dL — ABNORMAL LOW (ref 3.5–5.0)
Alkaline Phosphatase: 63 U/L (ref 38–126)
Anion gap: 8 (ref 5–15)
BUN: 31 mg/dL — ABNORMAL HIGH (ref 8–23)
CO2: 20 mmol/L — ABNORMAL LOW (ref 22–32)
Calcium: 8.4 mg/dL — ABNORMAL LOW (ref 8.9–10.3)
Chloride: 107 mmol/L (ref 98–111)
Creatinine, Ser: 1.13 mg/dL — ABNORMAL HIGH (ref 0.44–1.00)
GFR, Estimated: 45 mL/min — ABNORMAL LOW (ref >60)
Glucose, Bld: 108 mg/dL — ABNORMAL HIGH (ref 70–99)
Potassium: 4.3 mmol/L (ref 3.5–5.1)
Sodium: 135 mmol/L (ref 135–145)
Total Bilirubin: 0.6 mg/dL (ref 0.3–1.2)
Total Protein: 6.7 g/dL (ref 6.5–8.1)

## 2020-05-30 LAB — IRON AND TIBC
Iron: 35 ug/dL (ref 28–170)
Saturation Ratios: 11 % (ref 10.4–31.8)
TIBC: 317 ug/dL (ref 250–450)
UIBC: 282 ug/dL

## 2020-05-30 LAB — CBC
HCT: 23 % — ABNORMAL LOW (ref 36.0–46.0)
Hemoglobin: 7.3 g/dL — ABNORMAL LOW (ref 12.0–15.0)
MCH: 29.7 pg (ref 26.0–34.0)
MCHC: 31.7 g/dL (ref 30.0–36.0)
MCV: 93.5 fL (ref 80.0–100.0)
Platelets: 288 10*3/uL (ref 150–400)
RBC: 2.46 MIL/uL — ABNORMAL LOW (ref 3.87–5.11)
RDW: 15.5 % (ref 11.5–15.5)
WBC: 11.5 10*3/uL — ABNORMAL HIGH (ref 4.0–10.5)
nRBC: 0 % (ref 0.0–0.2)

## 2020-05-30 LAB — POC OCCULT BLOOD, ED: Fecal Occult Bld: POSITIVE — AB

## 2020-05-30 LAB — RETICULOCYTES
Immature Retic Fract: 12.5 % (ref 2.3–15.9)
RBC.: 2.4 MIL/uL — ABNORMAL LOW (ref 3.87–5.11)
Retic Count, Absolute: 52.6 10*3/uL (ref 19.0–186.0)
Retic Ct Pct: 2.2 % (ref 0.4–3.1)

## 2020-05-30 LAB — LIPASE, BLOOD: Lipase: 34 U/L (ref 11–51)

## 2020-05-30 LAB — TSH: TSH: 3.946 u[IU]/mL (ref 0.350–4.500)

## 2020-05-30 LAB — PREPARE RBC (CROSSMATCH)

## 2020-05-30 LAB — BRAIN NATRIURETIC PEPTIDE: B Natriuretic Peptide: 96 pg/mL (ref 0.0–100.0)

## 2020-05-30 LAB — VITAMIN B12: Vitamin B-12: 520 pg/mL (ref 180–914)

## 2020-05-30 LAB — FOLATE: Folate: 10.6 ng/mL (ref >5.9)

## 2020-05-30 LAB — FERRITIN: Ferritin: 10 ng/mL — ABNORMAL LOW (ref 11–307)

## 2020-05-30 LAB — SARS CORONAVIRUS 2 BY RT PCR (HOSPITAL ORDER, PERFORMED IN ~~LOC~~ HOSPITAL LAB): SARS Coronavirus 2: NEGATIVE

## 2020-05-30 LAB — ABO/RH: ABO/RH(D): B NEG

## 2020-05-30 MED ORDER — ONDANSETRON HCL 4 MG/2ML IJ SOLN: 4.0000 mg | Freq: Four times a day (QID) | INTRAMUSCULAR | Status: DC | PRN

## 2020-05-30 MED ORDER — ASPIRIN EC 81 MG PO TBEC
81.0000 mg | DELAYED_RELEASE_TABLET | Freq: Every day | ORAL | Status: DC
Start: 2020-05-31 — End: 2020-05-31
  Administered 2020-05-31: 81 mg via ORAL
  Filled 2020-05-30: qty 1

## 2020-05-30 MED ORDER — PANTOPRAZOLE SODIUM 40 MG PO TBEC: 40.0000 mg | DELAYED_RELEASE_TABLET | Freq: Every day | ORAL | Status: DC

## 2020-05-30 MED ORDER — SODIUM CHLORIDE 0.9 % IV BOLUS
1000.0000 mL | Freq: Once | INTRAVENOUS | Status: AC
  Administered 2020-05-30: 1000 mL via INTRAVENOUS

## 2020-05-30 MED ORDER — ONDANSETRON HCL 4 MG/2ML IJ SOLN
4.0000 mg | Freq: Once | INTRAMUSCULAR | Status: AC
  Administered 2020-05-30: 4 mg via INTRAVENOUS
  Filled 2020-05-30: qty 2

## 2020-05-30 MED ORDER — ACETAMINOPHEN 325 MG PO TABS: 650.0000 mg | ORAL_TABLET | Freq: Four times a day (QID) | ORAL | Status: DC | PRN

## 2020-05-30 MED ORDER — METRONIDAZOLE IN NACL 5-0.79 MG/ML-% IV SOLN: 500.0000 mg | Freq: Three times a day (TID) | INTRAVENOUS | Status: DC

## 2020-05-30 MED ORDER — HEPARIN SODIUM (PORCINE) 5000 UNIT/ML IJ SOLN
5000.0000 [IU] | Freq: Three times a day (TID) | INTRAMUSCULAR | Status: DC
  Administered 2020-05-31 (×2): 5000 [IU] via SUBCUTANEOUS
  Filled 2020-05-30 (×2): qty 1

## 2020-05-30 MED ORDER — SODIUM CHLORIDE 0.9 % IV SOLN
10.0000 mL/h | Freq: Once | INTRAVENOUS | Status: AC
  Administered 2020-05-30: 10 mL/h via INTRAVENOUS

## 2020-05-30 MED ORDER — MAGNESIUM HYDROXIDE 400 MG/5ML PO SUSP: 30.0000 mL | Freq: Every day | ORAL | Status: DC | PRN

## 2020-05-30 MED ORDER — ENSURE ENLIVE PO LIQD
237.0000 mL | Freq: Two times a day (BID) | ORAL | Status: DC
  Administered 2020-05-31 – 2020-06-02 (×5): 237 mL via ORAL

## 2020-05-30 MED ORDER — ONDANSETRON HCL 4 MG PO TABS: 4.0000 mg | ORAL_TABLET | Freq: Four times a day (QID) | ORAL | Status: DC | PRN

## 2020-05-30 MED ORDER — ACETAMINOPHEN 650 MG RE SUPP: 650.0000 mg | Freq: Four times a day (QID) | RECTAL | Status: DC | PRN

## 2020-05-30 MED ORDER — SODIUM CHLORIDE 0.9 % IV SOLN
1.0000 g | INTRAVENOUS | Status: DC
  Administered 2020-05-30 – 2020-05-31 (×2): 1 g via INTRAVENOUS
  Filled 2020-05-30: qty 10
  Filled 2020-05-30: qty 1

## 2020-05-30 MED ORDER — BISACODYL 10 MG RE SUPP: 10.0000 mg | Freq: Every day | RECTAL | Status: DC | PRN

## 2020-05-30 MED ORDER — IOHEXOL 300 MG/ML  SOLN
100.0000 mL | Freq: Once | INTRAMUSCULAR | Status: AC | PRN
  Administered 2020-05-30: 100 mL via INTRAVENOUS

## 2020-05-30 MED ORDER — HYDROMORPHONE HCL 1 MG/ML IJ SOLN
0.5000 mg | Freq: Once | INTRAMUSCULAR | Status: AC
  Administered 2020-05-30: 0.5 mg via INTRAVENOUS
  Filled 2020-05-30: qty 1

## 2020-05-30 NOTE — H&P (Addendum)
History and Physical    Elizabeth Jarvis OZH:[id number] DOB: 09/15/1921 DOA: 05/30/2020  PCP: No primary care provider on file.    Patient coming from:.  Home Chief Complaint:: Abdominal pain and failure to thrive  HPI: Elizabeth Jarvis is a 87 y.o. female who is a PhD in biochemistry with history of HTN, GERD, lower extremity lymphedema presents to [place] with left lower quadrant abdominal pain since a.m.  Patient hard of hearing therefore not able to contribute much to history however son reports that her mother has had issues with constipation in the recent past.  For the past 3 days she has not had a bowel movement and this morning started complaining of lower abdominal pain.  They have been trying multiple homemade recipes at home for constipation with mixed results.  Reports 1 large BM 10 days ago that mostly comprised of black-colored stools.  Otherwise reports that for past 3 weeks her mother has not been eating well with significant weight loss.  Attempts to force feed her have been unsuccessful.    ED Course: T-max 97.8 BP 125/73, pulse 98-1 01, RR 18, SPO2 97% room air  -WBC 11.5, Hb 7.3 MCV 93.5, platelets 288 - Sodium 135, potassium 4.3, BUN 31, creatinine 1.13, albumin 3.3 - LFTs and lipase within normal limits -- CT abdomen with diverticulosis without diverticulitis.  Borderline dilatation of the common bile duct may be normal for age.  Otherwise no acute intra-abdominal or intrapelvic pathology.  -In the ED she received 1 L of normal saline and was ordered for 2 unit of PRBCs for anemia.  Medicine requested for admission for anemia work-up/colonoscopy.  Review of Systems:   Past Medical History:  Diagnosis Date  . GERD (gastroesophageal reflux disease)   . Hypertension     No past surgical history on file.   reports that she has never smoked. She has never used smokeless tobacco. She reports that she does not drink alcohol and does not use drugs.  No Known  Allergies  No family history on file.   Prior to Admission medications   Medication Sig Start Date End Date Taking? Authorizing Provider  aspirin EC 81 MG tablet Take 81 mg by mouth daily.   Yes [provider]  b complex vitamins tablet Take 1 tablet by mouth daily.   Yes [provider]  calcium-vitamin D (OSCAL WITH D) 500-200 MG-UNIT per tablet Take 1 tablet by mouth daily with breakfast.   Yes [provider]  Magnesium Hydroxide (DULCOLAX PO) Take 1 tablet by mouth daily as needed (constipation).   Yes [provider]  omeprazole (PRILOSEC) 20 MG capsule Take 20 mg by mouth daily as needed (acid reflux).   Yes [provider]  Polyethylene Glycol 400 (BLINK TEARS OP) Place 1 drop into both eyes 3 (three) times daily.   Yes [provider]  amLODipine (NORVASC) 5 MG tablet Take 1 tablet (5 mg total) by mouth daily. Patient not taking: No sig reported 01/16/14   Meredeth Ide, MD  cephALEXin (KEFLEX) 500 MG capsule Take 1 capsule (500 mg total) by mouth 2 (two) times daily. Patient not taking: No sig reported 01/16/14   Meredeth Ide, MD  FLUoxetine (PROZAC) 10 MG capsule Take 1 capsule (10 mg total) by mouth daily. Patient not taking: Reported on 05/30/2020 01/11/14   Clydia Llano, MD  Zinc Oxide (DESITIN MAXIMUM STRENGTH) 40 % PSTE Apply to affected area 3 times a day Patient not taking: Reported on 05/30/2020  01/11/14   Clydia Llano, MD    Physical Exam: Vitals:   05/30/20 1500 05/30/20 1600 05/30/20 1630 05/30/20 1700  BP: 129/68 (!) 130/56 123/79 125/73  Pulse: 90 (!) 102 (!) 101 98  Resp:    18  Temp:      TempSrc:      SpO2: 100% 99% 99% 97%    Constitutional: NAD, calm, comfortable.  Hard of hearing.  Vitals:   05/30/20 1500 05/30/20 1600 05/30/20 1630 05/30/20 1700  BP: 129/68 (!) 130/56 123/79 125/73  Pulse: 90 (!) 102 (!) 101 98  Resp:    18  Temp:      TempSrc:      SpO2: 100% 99% 99% 97%   Eyes: PERRL, lids  and conjunctivae normal ENMT: Mucous membranes are moist. Posterior pharynx clear of any exudate or lesions.edentulous   Neck: normal, supple, no masses, no thyromegaly No JVD. Respiratory: clear to auscultation bilaterally, no wheezing, no crackles. Normal respiratory effort. No accessory muscle use.  Cardiovascular: Regular rate and rhythm, no murmurs / rubs / gallops. No extremity edema. 2+ pedal pulses. No carotid bruits.  Abdomen: Left lower quadrant abdominal pain without guarding or rebound.   Musculoskeletal: Bilateral lower extremity edema at least 2+ extending up to knees. Skin: no rashes, lesions, ulcers. No induration Neurologic: CN 2-12 grossly intact. Sensation intact, DTR normal. Strength 5/5 in all 4.  Psychiatric: Normal judgment and insight. Alert and oriented x 3. Normal mood.   Labs on Admission: I have personally reviewed following labs and imaging studies  CBC: Recent Labs  Lab 05/30/20 1400  WBC 11.5*  HGB 7.3*  HCT 23.0*  MCV 93.5  PLT 288   Basic Metabolic Panel: Recent Labs  Lab 05/30/20 1400  NA 135  K 4.3  CL 107  CO2 20*  GLUCOSE 108*  BUN 31*  CREATININE 1.13*  CALCIUM 8.4*   GFR: CrCl cannot be calculated (Unknown ideal weight.). Liver Function Tests: Recent Labs  Lab 05/30/20 1400  AST 16  ALT 12  ALKPHOS 63  BILITOT 0.6  PROT 6.7  ALBUMIN 3.3*   Recent Labs  Lab 05/30/20 1400  LIPASE 34   Anemia Panel: Recent Labs    05/30/20 1400  RETICCTPCT 2.2   Urine analysis:    Component Value Date/Time   COLORURINE YELLOW 01/13/2014 1354   APPEARANCEUR TURBID (A) 01/13/2014 1354   LABSPEC 1.011 01/13/2014 1354   PHURINE 6.5 01/13/2014 1354   GLUCOSEU NEGATIVE 01/13/2014 1354   HGBUR LARGE (A) 01/13/2014 1354   BILIRUBINUR NEGATIVE 01/13/2014 1354   KETONESUR NEGATIVE 01/13/2014 1354   PROTEINUR 100 (A) 01/13/2014 1354   UROBILINOGEN 1.0 01/13/2014 1354   NITRITE NEGATIVE 01/13/2014 1354   LEUKOCYTESUR LARGE (A)  01/13/2014 1354    Radiological Exams on Admission: CT Abdomen Pelvis W Contrast  Result Date: 05/30/2020 CLINICAL DATA:  Left lower quadrant abdominal pain EXAM: CT ABDOMEN AND PELVIS WITH CONTRAST TECHNIQUE: Multidetector CT imaging of the abdomen and pelvis was performed using the standard protocol following bolus administration of intravenous contrast. CONTRAST:  OMNIPAQUE IOHEXOL 300 MG/ML  SOLN COMPARISON:  01/07/2014 FINDINGS: Lower chest: No acute pleural or parenchymal lung disease. Calcified granuloma right middle lobe. Hepatobiliary: Evaluation limited by respiratory motion. No focal liver abnormalities. Gallbladder is decompressed. No evidence of cholelithiasis. Borderline dilation of the common bile duct measuring 11 mm, likely appropriate for patient age. No evidence of choledocholithiasis. Pancreas: Unremarkable. No pancreatic ductal dilatation or surrounding inflammatory changes. Spleen: Normal  in size without focal abnormality. Adrenals/Urinary Tract: Respiratory motion limits evaluation. There is bilateral renal cortical atrophy. Subcentimeter cortical cyst upper pole right kidney. No urinary tract calculi or obstructive uropathy. The bladder is unremarkable. Adrenals are normal. Stomach/Bowel: Evaluation limited by respiratory motion. There is no bowel obstruction or ileus. Diverticulosis of the descending and sigmoid colon without diverticulitis. Moderate retained gas and stool within the rectal vault. No bowel wall thickening or inflammatory changes. Vascular/Lymphatic: Aortic atherosclerosis. No enlarged abdominal or pelvic lymph nodes. Reproductive: Uterus and bilateral adnexa are unremarkable. Other: No free fluid or free gas.  No abdominal wall hernia. Musculoskeletal: No acute or destructive bony lesions. Reconstructed images demonstrate no additional findings. IMPRESSION: 1. Diverticulosis without diverticulitis. 2. Borderline dilation of the common bile duct may be normal for  age. No evidence of choledocholithiasis or cholelithiasis. 3. Otherwise no acute intra-abdominal or intrapelvic process on this study limited by respiratory motion. Electronically Signed   By: Sharlet Salina M.D.   On: 05/30/2020 16:00    Assessment/Plan Active Problems:   Abdominal pain   Anemia   Edema of both legs   Weakness generalized   Failure to thrive in adult   #LLQ abdominal pain -CT negative for diverticulitis but shows diverticulosis. Will try tap water enema and see if helps pain, to suggest constipation. Pain can be from cystitis as well. Will treat with CTX.   #Normocytic anemia Hb 7.3 MCV 93 -Awaiting iron/flate/b12 studies. Son reports a large black BM 10 days ago. Based on results from iron studies, will decide further w/u. ED is transfusing pt with 2 unit PRBCs. Clear liquid diet in case needs coloscopy.    #Failure to thrive  -With poor PO intake attributed by pt to constipation. TSH pending. Nutrition consult to assist with adequate caloric intake. Will need PT/OT.   # LE edema. -Attributed  In past to lymphedema. She was taking lasix previously.  BNP pending. NO reports of dyspnea/orthopnea.   #UTI   #Full code.   DVT prophylaxis: subQ heparin.   Code Status: Full code   Family Communication: Son at bedside.   Disposition Plan:Home vs SNF.   Consults called: GI by ED for colonoscopy.   Admission status: observation.    Elizabeth Lucchetti MD [facility]   If 7PM-7AM, please contact night-coverage [email or web address] Password [facility]  05/30/2020, 6:00 PM

## 2020-05-30 NOTE — ED Triage Notes (Addendum)
[facility] transported pt from home to [facility] and reports the following.   Relative woke pt up. Pt reported pain LLQ. No bowel movement in 2 days. Reporting constipation for several weeks. Not eating or drinking well. Family says she might taken in 200-400 calories per day. Poor skin turgor. EMS gave 300 mL NS. Pt speaks limited English. Family member OTW to hospital.

## 2020-05-30 NOTE — ED Provider Notes (Signed)
Patient sent me by Dr. Ethelle Lyon pending CT as well as fecal occult test.  Has evidence of diverticulosis with fecal occult positive blood.  Hemoglobin is 2 g below baseline.  Will order 1 unit of packed red blood cells.  Secure chat sent to GI on-call and will admit to the hospitalist service   Lorre Nick, MD 05/30/20 1645

## 2020-05-30 NOTE — ED Provider Notes (Addendum)
[facility]-EMERGENCY DEPT Provider Note   CSN: [id number] Arrival date & time: 05/30/20  1102     History Chief Complaint  Patient presents with  . Dehydration    Elizabeth Jarvis is a 87 y.o. female.  Patient c/o L:LQ pain. Symptoms acute onset in past couple days, moderate, dull, constant, non radiating, worse w palpation. Also noted with very poor po intake in the past few days. Hx of chronic constipation, pt is having periodic bm, no abd distension or vomiting. No fevers. No dysuria or gu c/o. No back or flank pain. No known hx diverticula.   The history is provided by the patient, the EMS personnel and a relative. A language interpreter was used.       Past Medical History:  Diagnosis Date  . GERD (gastroesophageal reflux disease)     Patient Active Problem List   Diagnosis Date Noted  . Urinary tract infection, site not specified 01/16/2014  . Acute kidney injury (HCC) 01/13/2014  . HTN (hypertension) 01/13/2014  . Leukocytosis, unspecified 01/13/2014  . Unspecified constipation 01/13/2014  . Decubitus ulcer limited to breakdown of skin (stage 2) (HCC) 01/13/2014  . Obesity (BMI 30-39.9) 01/13/2014  . Acute renal failure (HCC) 01/07/2014  . Hyponatremia 01/07/2014  . Edema of both legs 01/07/2014  . Weakness generalized 01/07/2014  . Urinary retention 01/07/2014    No past surgical history on file.   OB History   No obstetric history on file.     No family history on file.  Social History   Tobacco Use  . Smoking status: Never Smoker  . Smokeless tobacco: Never Used  Substance Use Topics  . Alcohol use: No  . Drug use: No    Home Medications Prior to Admission medications   Medication Sig Start Date End Date Taking? Authorizing Provider  amLODipine (NORVASC) 5 MG tablet Take 1 tablet (5 mg total) by mouth daily. 01/16/14   Meredeth Ide, MD  aspirin EC 81 MG tablet Take 81 mg by mouth daily.    [provider]  b  complex vitamins tablet Take 1 tablet by mouth daily.    [provider]  calcium-vitamin D (OSCAL WITH D) 500-200 MG-UNIT per tablet Take 1 tablet by mouth daily with breakfast.    [provider]  cephALEXin (KEFLEX) 500 MG capsule Take 1 capsule (500 mg total) by mouth 2 (two) times daily. 01/16/14   Meredeth Ide, MD  docusate sodium (COLACE) 100 MG capsule Take 100 mg by mouth daily as needed for mild constipation.    [provider]  FLUoxetine (PROZAC) 10 MG capsule Take 1 capsule (10 mg total) by mouth daily. 01/11/14   Clydia Llano, MD  furosemide (LASIX) 20 MG tablet Take 20 mg by mouth daily as needed for fluid.    [provider]  omeprazole (PRILOSEC) 20 MG capsule Take 20 mg by mouth daily.    [provider]  tetrahydrozoline (VISINE) 0.05 % ophthalmic solution Place 1 drop into both eyes 2 (two) times daily.    [provider]  Zinc Oxide (DESITIN MAXIMUM STRENGTH) 40 % PSTE Apply to affected area 3 times a day 01/11/14   Clydia Llano, MD    Allergies    Patient has no known allergies.  Review of Systems   Review of Systems  Constitutional: Negative for fever.  HENT: Negative for sore throat.   Eyes: Negative for redness.  Respiratory: Negative for shortness of breath.  Cardiovascular: Negative for chest pain.  Gastrointestinal: Positive for abdominal pain.  Genitourinary: Negative for flank pain.  Musculoskeletal: Negative for back pain.  Skin: Negative for rash.  Neurological: Negative for headaches.  Hematological: Does not bruise/bleed easily.  Psychiatric/Behavioral: Negative for agitation.    Physical Exam Updated Vital Signs BP (!) 111/59 (BP Location: Right Arm)   Pulse 92   Temp (!) 97.5 F (36.4 C) (Oral)   Resp 18   SpO2 99%   Physical Exam Vitals and nursing note reviewed.  Constitutional:      Appearance: Normal appearance. She is well-developed.  HENT:     Head: Atraumatic.     Nose: Nose  normal.     Mouth/Throat:     Comments: Appears dry/?dehydrated.  Eyes:     General: No scleral icterus.    Conjunctiva/sclera: Conjunctivae normal.     Pupils: Pupils are equal, round, and reactive to light.  Neck:     Trachea: No tracheal deviation.     Comments: No stiffness or rigidity.  Cardiovascular:     Rate and Rhythm: Normal rate and regular rhythm.     Pulses: Normal pulses.     Heart sounds: Normal heart sounds. No murmur heard. No friction rub. No gallop.   Pulmonary:     Effort: Pulmonary effort is normal. No respiratory distress.     Breath sounds: Normal breath sounds.  Abdominal:     General: Bowel sounds are normal. There is no distension.     Palpations: Abdomen is soft.     Tenderness: There is abdominal tenderness. There is no guarding.     Comments: LLQ tenderness.   Genitourinary:    Comments: No cva tenderness. Dark brown stool, no melena - sent for hemoccult. No impaction or mass felt.  Musculoskeletal:        General: No tenderness.     Cervical back: Normal range of motion and neck supple. No rigidity. No muscular tenderness.     Right lower leg: Edema present.     Left lower leg: Edema present.     Comments: Moderate, symmetric, bil foot/ankle/lower leg edema (not as chronic per pt).  Skin:    General: Skin is warm and dry.     Findings: No rash.  Neurological:     Mental Status: She is alert.     Comments: Awake and alert. Content. Moves bil ext purposefully.   Psychiatric:        Mood and Affect: Mood normal.     ED Results / Procedures / Treatments   Labs (all labs ordered are listed, but only abnormal results are displayed) Results for orders placed or performed during the hospital encounter of 05/30/20  SARS Coronavirus 2 by RT PCR (hospital order, performed in [facility] hospital lab) Nasopharyngeal Nasopharyngeal Swab   Specimen: Nasopharyngeal Swab  Result Value Ref Range   SARS Coronavirus 2 NEGATIVE NEGATIVE  CBC  Result Value  Ref Range   WBC 11.5 (H) 4.0 - 10.5 K/uL   RBC 2.46 (L) 3.87 - 5.11 MIL/uL   Hemoglobin 7.3 (L) 12.0 - 15.0 g/dL   HCT 16.123.0 (L) 09.636.0 - 04.546.0 %   MCV 93.5 80.0 - 100.0 fL   MCH 29.7 26.0 - 34.0 pg   MCHC 31.7 30.0 - 36.0 g/dL   RDW 40.915.5 81.111.5 - 91.415.5 %   Platelets 288 150 - 400 K/uL   nRBC 0.0 0.0 - 0.2 %  Comprehensive metabolic panel  Result Value Ref Range  Sodium 135 135 - 145 mmol/L   Potassium 4.3 3.5 - 5.1 mmol/L   Chloride 107 98 - 111 mmol/L   CO2 20 (L) 22 - 32 mmol/L   Glucose, Bld 108 (H) 70 - 99 mg/dL   BUN 31 (H) 8 - 23 mg/dL   Creatinine, Ser 1.79 (H) 0.44 - 1.00 mg/dL   Calcium 8.4 (L) 8.9 - 10.3 mg/dL   Total Protein 6.7 6.5 - 8.1 g/dL   Albumin 3.3 (L) 3.5 - 5.0 g/dL   AST 16 15 - 41 U/L   ALT 12 0 - 44 U/L   Alkaline Phosphatase 63 38 - 126 U/L   Total Bilirubin 0.6 0.3 - 1.2 mg/dL   GFR, Estimated 45 (L) >60 mL/min   Anion gap 8 5 - 15  Lipase, blood  Result Value Ref Range   Lipase 34 11 - 51 U/L  Urinalysis, Routine w reflex microscopic Urine, Clean Catch  Result Value Ref Range   Color, Urine YELLOW YELLOW   APPearance CLEAR CLEAR   Specific Gravity, Urine 1.039 (H) 1.005 - 1.030   pH 5.0 5.0 - 8.0   Glucose, UA NEGATIVE NEGATIVE mg/dL   Hgb urine dipstick SMALL (A) NEGATIVE   Bilirubin Urine NEGATIVE NEGATIVE   Ketones, ur NEGATIVE NEGATIVE mg/dL   Protein, ur NEGATIVE NEGATIVE mg/dL   Nitrite NEGATIVE NEGATIVE   Leukocytes,Ua NEGATIVE NEGATIVE   RBC / HPF 0-5 0 - 5 RBC/hpf   WBC, UA 6-10 0 - 5 WBC/hpf   Bacteria, UA NONE SEEN NONE SEEN   Squamous Epithelial / LPF 0-5 0 - 5   Mucus PRESENT   Vitamin B12  Result Value Ref Range   Vitamin B-12 520 180 - 914 pg/mL  Folate  Result Value Ref Range   Folate 10.6 >5.9 ng/mL  Iron and TIBC  Result Value Ref Range   Iron 35 28 - 170 ug/dL   TIBC 150 569 - 794 ug/dL   Saturation Ratios 11 10.4 - 31.8 %   UIBC 282 ug/dL  Ferritin  Result Value Ref Range   Ferritin 10 (L) 11 - 307 ng/mL   Reticulocytes  Result Value Ref Range   Retic Ct Pct 2.2 0.4 - 3.1 %   RBC. 2.40 (L) 3.87 - 5.11 MIL/uL   Retic Count, Absolute 52.6 19.0 - 186.0 K/uL   Immature Retic Fract 12.5 2.3 - 15.9 %  Brain natriuretic peptide  Result Value Ref Range   B Natriuretic Peptide 96.0 0.0 - 100.0 pg/mL  TSH  Result Value Ref Range   TSH 3.946 0.350 - 4.500 uIU/mL  Comprehensive metabolic panel  Result Value Ref Range   Sodium 136 135 - 145 mmol/L   Potassium 3.9 3.5 - 5.1 mmol/L   Chloride 108 98 - 111 mmol/L   CO2 17 (L) 22 - 32 mmol/L   Glucose, Bld 78 70 - 99 mg/dL   BUN 29 (H) 8 - 23 mg/dL   Creatinine, Ser 8.01 (H) 0.44 - 1.00 mg/dL   Calcium 8.7 (L) 8.9 - 10.3 mg/dL   Total Protein 5.6 (L) 6.5 - 8.1 g/dL   Albumin 2.8 (L) 3.5 - 5.0 g/dL   AST 19 15 - 41 U/L   ALT 10 0 - 44 U/L   Alkaline Phosphatase 52 38 - 126 U/L   Total Bilirubin 1.0 0.3 - 1.2 mg/dL   GFR, Estimated 46 (L) >60 mL/min   Anion gap 11 5 - 15  CBC  Result  Value Ref Range   WBC 7.6 4.0 - 10.5 K/uL   RBC 3.61 (L) 3.87 - 5.11 MIL/uL   Hemoglobin 10.0 (L) 12.0 - 15.0 g/dL   HCT 16.131.1 (L) 09.636.0 - 04.546.0 %   MCV 86.1 80.0 - 100.0 fL   MCH 27.7 26.0 - 34.0 pg   MCHC 32.2 30.0 - 36.0 g/dL   RDW 40.922.0 (H) 81.111.5 - 91.415.5 %   Platelets 195 150 - 400 K/uL   nRBC 0.0 0.0 - 0.2 %  POC occult blood, ED Provider will collect  Result Value Ref Range   Fecal Occult Bld POSITIVE (A) NEGATIVE  Type and screen  Result Value Ref Range   ABO/RH(D) B NEG    Antibody Screen NEG    Sample Expiration 06/02/2020,2359    Unit Number [id number]    Blood Component Type RED CELLS,LR    Unit division 00    Status of Unit ISSUED,FINAL    Transfusion Status OK TO TRANSFUSE    Crossmatch Result      Compatible Performed at [facility], [address] [place], [place] [address]    Unit Number [id number]    Blood Component Type RED CELLS,LR    Unit division 00    Status of Unit ISSUED    Transfusion Status OK TO  TRANSFUSE    Crossmatch Result Compatible    Unit Number [id number]    Blood Component Type RED CELLS,LR    Unit division 00    Status of Unit ALLOCATED    Transfusion Status OK TO TRANSFUSE    Crossmatch Result Compatible   Prepare RBC (crossmatch)  Result Value Ref Range   Order Confirmation      ORDER PROCESSED BY BLOOD BANK Performed at [facility], [address] [place], [place] [address]   ABO/Rh  Result Value Ref Range   ABO/RH(D)      B NEG Performed at [facility], [address] [place], [place] [address]   Prepare RBC (crossmatch)  Result Value Ref Range   Order Confirmation      ORDER PROCESSED BY BLOOD BANK Performed at [facility], [address] [place], [place] [address]   [facility]  Result Value Ref Range   ISSUE DATE / TIME [id number]    Blood Product Unit Number [id number]    PRODUCT CODE [id number]    Unit Type and Rh 1700    Blood Product Expiration Date [id number]    ISSUE DATE / TIME [id number]    Blood Product Unit Number [id number]    PRODUCT CODE [id number]    Unit Type and Rh 1700    Blood Product Expiration Date 03-26-20222359    Blood Product Unit Number [id number]    PRODUCT CODE [id number]    Unit Type and Rh 1700    Blood Product Expiration Date [id number]     EKG None  Radiology CT Abdomen Pelvis W Contrast  Result Date: 05/30/2020 CLINICAL DATA:  Left lower quadrant abdominal pain EXAM: CT ABDOMEN AND PELVIS WITH CONTRAST TECHNIQUE: Multidetector CT imaging of the abdomen and pelvis was performed using the standard protocol following bolus administration of intravenous contrast. CONTRAST:  100mL OMNIPAQUE IOHEXOL 300 MG/ML  SOLN COMPARISON:  01/07/2014 FINDINGS: Lower chest: No acute pleural or parenchymal lung disease. Calcified granuloma right middle lobe. Hepatobiliary: Evaluation limited by respiratory motion. No focal liver abnormalities. Gallbladder is  decompressed. No evidence of cholelithiasis. Borderline dilation of the common bile duct  measuring 11 mm, likely appropriate for patient age. No evidence of choledocholithiasis. Pancreas: Unremarkable. No pancreatic ductal dilatation or surrounding inflammatory changes. Spleen: Normal in size without focal abnormality. Adrenals/Urinary Tract: Respiratory motion limits evaluation. There is bilateral renal cortical atrophy. Subcentimeter cortical cyst upper pole right kidney. No urinary tract calculi or obstructive uropathy. The bladder is unremarkable. Adrenals are normal. Stomach/Bowel: Evaluation limited by respiratory motion. There is no bowel obstruction or ileus. Diverticulosis of the descending and sigmoid colon without diverticulitis. Moderate retained gas and stool within the rectal vault. No bowel wall thickening or inflammatory changes. Vascular/Lymphatic: Aortic atherosclerosis. No enlarged abdominal or pelvic lymph nodes. Reproductive: Uterus and bilateral adnexa are unremarkable. Other: No free fluid or free gas.  No abdominal wall hernia. Musculoskeletal: No acute or destructive bony lesions. Reconstructed images demonstrate no additional findings. IMPRESSION: 1. Diverticulosis without diverticulitis. 2. Borderline dilation of the common bile duct may be normal for age. No evidence of choledocholithiasis or cholelithiasis. 3. Otherwise no acute intra-abdominal or intrapelvic process on this study limited by respiratory motion. Electronically Signed   By: Sharlet Salina M.D.   On: 05/30/2020 16:00    Procedures Procedures (including critical care time)  Medications Ordered in ED Medications  sodium chloride 0.9 % bolus 1,000 mL (has no administration in time range)  HYDROmorphone (DILAUDID) injection 0.5 mg (has no administration in time range)  ondansetron (ZOFRAN) injection 4 mg (has no administration in time range)    ED Course  I have reviewed the triage vital signs and the nursing  notes.  Pertinent labs & imaging results that were available during my care of the patient were reviewed by me and considered in my medical decision making (see chart for details).    MDM Rules/Calculators/A&P                         Iv ns bolus. zofran iv. Dilaudid .5 mg iv. Labs sent.  Reviewed nursing notes and prior charts for additional history.   Labs reviewed/interpreted by me  - wbc mildly elevated. Pt is anemia, anemia panel added and stool sent for hemoccult.   Persistent tenderness, will get ct.   1517 CT pending - signed out to Dr Freida Busman to check ct, hemoccult/pending tests, and dispo appropriately.  CT reviewed/interpreted by me - diverticulosis, no itis.   MDM Number of Diagnoses or Management Options   Amount and/or Complexity of Data Reviewed Clinical lab tests: ordered and reviewed Tests in the radiology section of CPT: ordered and reviewed Tests in the medicine section of CPT: ordered and reviewed Discussion of test results with the performing providers: yes Decide to obtain previous medical records or to obtain history from someone other than the patient: yes Obtain history from someone other than the patient: yes Review and summarize past medical records: yes Discuss the patient with other providers: yes Independent visualization of images, tracings, or specimens: yes  Risk of Complications, Morbidity, and/or Mortality Presenting problems: high Diagnostic procedures: high Management options: high         Final Clinical Impression(s) / ED Diagnoses Final diagnoses:  None    Rx / DC Orders ED Discharge Orders    None         Cathren Laine, MD 05/31/20 1254

## 2020-05-31 ENCOUNTER — Encounter (HOSPITAL_COMMUNITY): Payer: Self-pay | Admitting: Family Medicine

## 2020-05-31 DIAGNOSIS — Z7982 Long term (current) use of aspirin: Secondary | ICD-10-CM | POA: Diagnosis not present

## 2020-05-31 DIAGNOSIS — K922 Gastrointestinal hemorrhage, unspecified: Secondary | ICD-10-CM | POA: Diagnosis not present

## 2020-05-31 DIAGNOSIS — E860 Dehydration: Secondary | ICD-10-CM | POA: Diagnosis present

## 2020-05-31 DIAGNOSIS — I890 Lymphedema, not elsewhere classified: Secondary | ICD-10-CM | POA: Diagnosis present

## 2020-05-31 DIAGNOSIS — N179 Acute kidney failure, unspecified: Secondary | ICD-10-CM | POA: Diagnosis present

## 2020-05-31 DIAGNOSIS — D649 Anemia, unspecified: Secondary | ICD-10-CM | POA: Diagnosis present

## 2020-05-31 DIAGNOSIS — K838 Other specified diseases of biliary tract: Secondary | ICD-10-CM | POA: Diagnosis present

## 2020-05-31 DIAGNOSIS — D500 Iron deficiency anemia secondary to blood loss (chronic): Secondary | ICD-10-CM | POA: Diagnosis present

## 2020-05-31 DIAGNOSIS — K5900 Constipation, unspecified: Secondary | ICD-10-CM | POA: Diagnosis present

## 2020-05-31 DIAGNOSIS — K219 Gastro-esophageal reflux disease without esophagitis: Secondary | ICD-10-CM | POA: Diagnosis present

## 2020-05-31 DIAGNOSIS — Z79899 Other long term (current) drug therapy: Secondary | ICD-10-CM | POA: Diagnosis not present

## 2020-05-31 DIAGNOSIS — K5730 Diverticulosis of large intestine without perforation or abscess without bleeding: Secondary | ICD-10-CM | POA: Diagnosis present

## 2020-05-31 DIAGNOSIS — Z20822 Contact with and (suspected) exposure to covid-19: Secondary | ICD-10-CM | POA: Diagnosis present

## 2020-05-31 DIAGNOSIS — R1032 Left lower quadrant pain: Secondary | ICD-10-CM | POA: Diagnosis not present

## 2020-05-31 DIAGNOSIS — Z6832 Body mass index (BMI) 32.0-32.9, adult: Secondary | ICD-10-CM | POA: Diagnosis not present

## 2020-05-31 DIAGNOSIS — I10 Essential (primary) hypertension: Secondary | ICD-10-CM | POA: Diagnosis present

## 2020-05-31 DIAGNOSIS — R627 Adult failure to thrive: Secondary | ICD-10-CM | POA: Diagnosis present

## 2020-05-31 DIAGNOSIS — H9190 Unspecified hearing loss, unspecified ear: Secondary | ICD-10-CM | POA: Diagnosis present

## 2020-05-31 LAB — CBC
HCT: 31.1 % — ABNORMAL LOW (ref 36.0–46.0)
Hemoglobin: 10 g/dL — ABNORMAL LOW (ref 12.0–15.0)
MCH: 27.7 pg (ref 26.0–34.0)
MCHC: 32.2 g/dL (ref 30.0–36.0)
MCV: 86.1 fL (ref 80.0–100.0)
Platelets: 195 10*3/uL (ref 150–400)
RBC: 3.61 MIL/uL — ABNORMAL LOW (ref 3.87–5.11)
RDW: 22 % — ABNORMAL HIGH (ref 11.5–15.5)
WBC: 7.6 10*3/uL (ref 4.0–10.5)
nRBC: 0 % (ref 0.0–0.2)

## 2020-05-31 LAB — COMPREHENSIVE METABOLIC PANEL
ALT: 10 U/L (ref 0–44)
AST: 19 U/L (ref 15–41)
Albumin: 2.8 g/dL — ABNORMAL LOW (ref 3.5–5.0)
Alkaline Phosphatase: 52 U/L (ref 38–126)
Anion gap: 11 (ref 5–15)
BUN: 29 mg/dL — ABNORMAL HIGH (ref 8–23)
CO2: 17 mmol/L — ABNORMAL LOW (ref 22–32)
Calcium: 8.7 mg/dL — ABNORMAL LOW (ref 8.9–10.3)
Chloride: 108 mmol/L (ref 98–111)
Creatinine, Ser: 1.09 mg/dL — ABNORMAL HIGH (ref 0.44–1.00)
GFR, Estimated: 46 mL/min — ABNORMAL LOW (ref >60)
Glucose, Bld: 78 mg/dL (ref 70–99)
Potassium: 3.9 mmol/L (ref 3.5–5.1)
Sodium: 136 mmol/L (ref 135–145)
Total Bilirubin: 1 mg/dL (ref 0.3–1.2)
Total Protein: 5.6 g/dL — ABNORMAL LOW (ref 6.5–8.1)

## 2020-05-31 LAB — PREPARE RBC (CROSSMATCH)

## 2020-05-31 MED ORDER — SODIUM CHLORIDE 0.9% IV SOLUTION: Freq: Once | INTRAVENOUS | Status: AC

## 2020-05-31 MED ORDER — BISACODYL 10 MG RE SUPP
10.0000 mg | Freq: Two times a day (BID) | RECTAL | Status: DC
  Administered 2020-05-31: 10 mg via RECTAL
  Filled 2020-05-31 (×2): qty 1

## 2020-05-31 MED ORDER — POLYETHYLENE GLYCOL 3350 17 G PO PACK: 17.0000 g | PACK | Freq: Two times a day (BID) | ORAL | Status: DC

## 2020-05-31 MED ORDER — POLYETHYLENE GLYCOL 3350 17 G PO PACK
17.0000 g | PACK | Freq: Three times a day (TID) | ORAL | Status: DC
  Administered 2020-05-31: 17 g via ORAL
  Filled 2020-05-31 (×2): qty 1

## 2020-05-31 MED ORDER — PANTOPRAZOLE SODIUM 40 MG IV SOLR
40.0000 mg | Freq: Two times a day (BID) | INTRAVENOUS | Status: DC
  Administered 2020-05-31 – 2020-06-01 (×3): 40 mg via INTRAVENOUS
  Filled 2020-05-31 (×3): qty 40

## 2020-05-31 NOTE — Progress Notes (Addendum)
Initial Nutrition Assessment  DOCUMENTATION CODES:   Obesity unspecified  INTERVENTION:   -Ensure Enlive po BID, each supplement provides 350 kcal and 20 grams of protein   NUTRITION DIAGNOSIS:   Inadequate oral intake related to poor appetite,constipation as evidenced by per patient/family report.  GOAL:   Patient will meet greater than or equal to 90% of their needs  MONITOR:   PO intake,Supplement acceptance,Labs,Weight trends,I & O's  REASON FOR ASSESSMENT:   Consult Poor PO,Assessment of nutrition requirement/status  ASSESSMENT:   87 y.o. female with history of GERD, lower extremity lymphedema, and HTN presenting for consultation of anemia, constipation, and LLQ abdominal pain.  Patient is HOH and unable to provide history.  Per family report, pt has been having constipation lately. Poor PO r/t taste changes and poor appetite.   GI recommending clear liquids, NPO after midnight in case EGD is indicated.  Noted that pt is a strict vegetarian, avoids meats, eggs, gelatin and meat based broths and soups. Ensure supplements ordered and pt is accepting. Will continue.  Per weight records, no weight for admission. Recorded weight is the same as recorded from 2015.   Labs reviewed. Medications:  Dulcolax suppository   NUTRITION - FOCUSED PHYSICAL EXAM:  Unable to complete  Diet Order:   Diet Order            Diet NPO time specified  Diet effective midnight           Diet clear liquid Room service appropriate? Yes; Fluid consistency: Thin  Diet effective now                 EDUCATION NEEDS:   No education needs have been identified at this time  Skin:  Skin Assessment: Reviewed RN Assessment  Last BM:  1/20  Height:   Ht Readings from Last 1 Encounters:  05/30/20 5' (1.524 m)    Weight:   Wt Readings from Last 1 Encounters:  05/30/20 75.3 kg   BMI:  Body mass index is 32.42 kg/m.  Estimated Nutritional Needs:   Kcal:   1500-1700  Protein:  60-75g  Fluid:  1.7L/day  Tilda Franco, MS, RD, LDN Inpatient Clinical Dietitian Contact information available via Amion

## 2020-05-31 NOTE — Progress Notes (Signed)
[facility]  PROGRESS NOTE  Elizabeth Jarvis PNT:[id number] DOB: 25-Mar-1922 DOA: 05/30/2020 PCP: Pcp, No   Brief HPI:   87 year old female who is being seen by [facility] with medical history of hypertension, GERD, lower extremity lymphedema presented with left lower quadrant abdominal pain.  Per family patient has issues with constipation in the recent past.  She has not had BM for past 3 days.  They have been trying homemade recipes to treat constipation.  She had one large BM 10 days ago at that time it was black-colored stool.  In the ED CT of the abdomen pelvis was done which showed diverticulosis without radiculitis.  Borderline dilatation of the common bile duct may be normal for age.  2 units PRBC were ordered for anemia, her hemoglobin was 7.3.  GI was consulted.    Subjective   Patient seen and examined, no new complaints.  Communication limited as patient is hard of hearing.   Assessment/Plan:     1. Left lower quadrant abdominal pain-CT abdomen pelvis negative for diverticulitis but shows diverticulosis.  GI has been consulted, patient started on MiraLAX 17 g 3 times daily along with daily Dulcolax suppository.  She also received 1 tapwater enema last night. 2. Anemia/positive FOBT-hemoglobin is 10.0.  Patient received 2 units PRBC last night.  GI has been consulted, she has FOBT positive.  Due to her advanced age and no obvious melena or hematochezia, GI recommends conservative management.  Protonix dose increased to 40 mg twice a day.  Aspirin has been discontinued. 3. Lower extremity edema-secondary to lymphedema. 4. Failure to thrive-nutrition consulted.  TSH 3.946.     COVID-19 Labs  Recent Labs    05/30/20 1740  FERRITIN 10*    Lab Results  Component Value Date   SARSCOV2NAA NEGATIVE 05/30/2020     Scheduled medications:   . bisacodyl  10 mg Rectal BID  . feeding supplement  237 mL Oral BID BM  . heparin  5,000 Units Subcutaneous Q8H  . pantoprazole  (PROTONIX) IV  40 mg Intravenous Q12H  . polyethylene glycol  17 g Oral TID         CBG: No results for input(s): GLUCAP in the last 168 hours.  SpO2: 97 %    CBC: Recent Labs  Lab 05/30/20 1400 05/31/20 0615  WBC 11.5* 7.6  HGB 7.3* 10.0*  HCT 23.0* 31.1*  MCV 93.5 86.1  PLT 288 195    Basic Metabolic Panel: Recent Labs  Lab 05/30/20 1400 05/31/20 0615  NA 135 136  K 4.3 3.9  CL 107 108  CO2 20* 17*  GLUCOSE 108* 78  BUN 31* 29*  CREATININE 1.13* 1.09*  CALCIUM 8.4* 8.7*     Liver Function Tests: Recent Labs  Lab 05/30/20 1400 05/31/20 0615  AST 16 19  ALT 12 10  ALKPHOS 63 52  BILITOT 0.6 1.0  PROT 6.7 5.6*  ALBUMIN 3.3* 2.8*     Antibiotics: Anti-infectives (From admission, onward)   Start     Dose/Rate Route Frequency Ordered Stop   05/30/20 1800  cefTRIAXone (ROCEPHIN) 1 g in sodium chloride 0.9 % 100 mL IVPB        1 g 200 mL/hr over 30 Minutes Intravenous Every 24 hours 05/30/20 1753     05/30/20 1800  metroNIDAZOLE (FLAGYL) IVPB 500 mg  Status:  Discontinued        500 mg 100 mL/hr over 60 Minutes Intravenous Every 8 hours 05/30/20 1753 05/30/20 1822  DVT prophylaxis: SCDs  Code Status: Full code  Family Communication: No family at bedside   Consultants:  Gastroenterology  Procedures:      Objective   Vitals:   05/31/20 0140 05/31/20 0430 05/31/20 0803 05/31/20 1157  BP: (!) 146/71 (!) 152/78 (!) 150/90 (!) 160/76  Pulse: 82 78 93 77  Resp:   20 16  Temp: 98.2 F (36.8 C) 98.1 F (36.7 C) 97.9 F (36.6 C) 98.2 F (36.8 C)  TempSrc: Oral Oral Oral Oral  SpO2: 99% 100% 100% 97%  Weight:      Height:        Intake/Output Summary (Last 24 hours) at 05/31/2020 1648 Last data filed at 05/31/2020 0420 Gross per 24 hour  Intake 1348.75 ml  Output --  Net 1348.75 ml    01/22 1901 - 01/24 0700 In: 2248.8  Out: -   Filed Weights   05/30/20 2130  Weight: 75.3 kg    Physical  Examination:  General-appears in no acute distress Heart-S1-S2, regular, no murmur auscultated Lungs-clear to auscultation bilaterally, no wheezing or crackles auscultated Abdomen-soft, nontender, no organomegaly Extremities-no edema in the lower extremities Neuro-alert, hard of hearing  Status is: Inpatient  Dispo: The patient is from: Home              Anticipated d/c is to: Home              Anticipated d/c date is: 06/02/2020              Patient currently not medically stable for discharge  Barrier to discharge-   Pressure Ulcer 01/13/14 Stage II -  Partial thickness loss of dermis presenting as a shallow open ulcer with a red, pink wound bed without slough. multiple sites, pink, bleeding (Active)  01/13/14 1752  Location: Buttocks  Location Orientation: Right  Staging: Stage II -  Partial thickness loss of dermis presenting as a shallow open ulcer with a red, pink wound bed without slough.  Wound Description (Comments): multiple sites, pink, bleeding  Present on Admission: Yes     Pressure Ulcer 01/13/14 Stage II -  Partial thickness loss of dermis presenting as a shallow open ulcer with a red, pink wound bed without slough. pink, 3 diff sites (Active)  01/13/14 1756  Location: Coccyx  Location Orientation:   Staging: Stage II -  Partial thickness loss of dermis presenting as a shallow open ulcer with a red, pink wound bed without slough.  Wound Description (Comments): pink, 3 diff sites  Present on Admission: Yes           Data Reviewed:   Recent Results (from the past 240 hour(s))  SARS Coronavirus 2 by RT PCR (hospital order, performed in [facility] hospital lab) Nasopharyngeal Nasopharyngeal Swab     Status: None   Collection Time: 05/30/20  8:36 PM   Specimen: Nasopharyngeal Swab  Result Value Ref Range Status   SARS Coronavirus 2 NEGATIVE NEGATIVE Final    Comment: (NOTE) SARS-CoV-2 target nucleic acids are NOT DETECTED.  The SARS-CoV-2 RNA is  generally detectable in upper and lower respiratory specimens during the acute phase of infection. The lowest concentration of SARS-CoV-2 viral copies this assay can detect is 250 copies / mL. A negative result does not preclude SARS-CoV-2 infection and should not be used as the sole basis for treatment or other patient management decisions.  A negative result may occur with improper specimen collection / handling, submission of specimen other than nasopharyngeal swab,  presence of viral mutation(s) within the areas targeted by this assay, and inadequate number of viral copies (<250 copies / mL). A negative result must be combined with clinical observations, patient history, and epidemiological information.  Fact Sheet for Patients:   BoilerBrush.com.cy  Fact Sheet for Healthcare Providers: https://pope.com/  This test is not yet approved or  cleared by the [place] FDA and has been authorized for detection and/or diagnosis of SARS-CoV-2 by FDA under an Emergency Use Authorization (EUA).  This EUA will remain in effect (meaning this test can be used) for the duration of the COVID-19 declaration under Section 564(b)(1) of the Act, 21 U.S.C. section 360bbb-3(b)(1), unless the authorization is terminated or revoked sooner.  Performed at [facility], [address], [place], [place] [address]     Recent Labs  Lab 05/30/20 1400  LIPASE 34   No results for input(s): AMMONIA in the last 168 hours.  Cardiac Enzymes: No results for input(s): CKTOTAL, CKMB, CKMBINDEX, TROPONINI in the last 168 hours. BNP (last 3 results) Recent Labs    05/30/20 1400  BNP 96.0    ProBNP (last 3 results) No results for input(s): PROBNP in the last 8760 hours.  Studies:  CT Abdomen Pelvis W Contrast  Result Date: 05/30/2020 CLINICAL DATA:  Left lower quadrant abdominal pain EXAM: CT ABDOMEN AND PELVIS WITH CONTRAST  TECHNIQUE: Multidetector CT imaging of the abdomen and pelvis was performed using the standard protocol following bolus administration of intravenous contrast. CONTRAST:  OMNIPAQUE IOHEXOL 300 MG/ML  SOLN COMPARISON:  01/07/2014 FINDINGS: Lower chest: No acute pleural or parenchymal lung disease. Calcified granuloma right middle lobe. Hepatobiliary: Evaluation limited by respiratory motion. No focal liver abnormalities. Gallbladder is decompressed. No evidence of cholelithiasis. Borderline dilation of the common bile duct measuring 11 mm, likely appropriate for patient age. No evidence of choledocholithiasis. Pancreas: Unremarkable. No pancreatic ductal dilatation or surrounding inflammatory changes. Spleen: Normal in size without focal abnormality. Adrenals/Urinary Tract: Respiratory motion limits evaluation. There is bilateral renal cortical atrophy. Subcentimeter cortical cyst upper pole right kidney. No urinary tract calculi or obstructive uropathy. The bladder is unremarkable. Adrenals are normal. Stomach/Bowel: Evaluation limited by respiratory motion. There is no bowel obstruction or ileus. Diverticulosis of the descending and sigmoid colon without diverticulitis. Moderate retained gas and stool within the rectal vault. No bowel wall thickening or inflammatory changes. Vascular/Lymphatic: Aortic atherosclerosis. No enlarged abdominal or pelvic lymph nodes. Reproductive: Uterus and bilateral adnexa are unremarkable. Other: No free fluid or free gas.  No abdominal wall hernia. Musculoskeletal: No acute or destructive bony lesions. Reconstructed images demonstrate no additional findings. IMPRESSION: 1. Diverticulosis without diverticulitis. 2. Borderline dilation of the common bile duct may be normal for age. No evidence of choledocholithiasis or cholelithiasis. 3. Otherwise no acute intra-abdominal or intrapelvic process on this study limited by respiratory motion. Electronically Signed   By: Sharlet Salina M.D.   On: 05/30/2020 16:00       Alyan Hartline S Tyra Michelle   [facility] If 7PM-7AM, please contact night-coverage at [email or web address], Office  [phone]   05/31/2020, 4:48 PM  LOS: 0 days

## 2020-05-31 NOTE — Consult Note (Signed)
Referring Provider: Dr. Lorre Nick (ED) Primary Care Physician:  Pcp, No Primary Gastroenterologist:  Gentry Fitz  Reason for Consultation:  Anemia, constipation, LLQ abdominal pain.  HPI: Elizabeth Jarvis is a 87 y.o. female with history of GERD, lower extremity lymphedema, and HTN presenting for consultation of anemia, constipation, and LLQ abdominal pain.  Patient extremely hard of hearing and unable to hear most of my ROS questions, thus most of the history was obtained by patient's son Fifi Schindler via phone.  Per patient's son, whom patient lives with, they moved from [place] 2.5 weeks ago, and since that time, patient has had worsening constipation.  She used dulcolax and an herbal mixture with some relief, but continued to have constipation.  She has also had blackish brown stools for the last 1 to 2 weeks.  Yesterday, she developed LLQ pain and thus presented to the ED.  Per son, patient has had decreased appetite and PO intake and weight loss of 5-10 lbs over the last 1 month.  Per son, patient states food does not have any flavor and thus she doesn't feel like eating.  She is on 81 mg ASA daily.  No other NSAID or blood thinner use.  Patient son reports that her PCP that she may have a gastric ulcer in the past based on symptoms (but no EGD was completed).  Patient is on omeprazole 20 mg daily for GERD.  No known dysphagia.  No family history of colon cancer or gastrointestinal malignancy.  Patient has never had an EGD or colonoscopy.   Past Medical History:  Diagnosis Date  . GERD (gastroesophageal reflux disease)   . Hypertension     No past surgical history on file.  Prior to Admission medications   Medication Sig Start Date End Date Taking? Authorizing Provider  aspirin EC 81 MG tablet Take 81 mg by mouth daily.   Yes [provider]  b complex vitamins tablet Take 1 tablet by mouth daily.   Yes [provider]  calcium-vitamin D (OSCAL WITH D) 500-200  MG-UNIT per tablet Take 1 tablet by mouth daily with breakfast.   Yes [provider]  Magnesium Hydroxide (DULCOLAX PO) Take 1 tablet by mouth daily as needed (constipation).   Yes [provider]  omeprazole (PRILOSEC) 20 MG capsule Take 20 mg by mouth daily as needed (acid reflux).   Yes [provider]  Polyethylene Glycol 400 (BLINK TEARS OP) Place 1 drop into both eyes 3 (three) times daily.   Yes [provider]  amLODipine (NORVASC) 5 MG tablet Take 1 tablet (5 mg total) by mouth daily. Patient not taking: No sig reported 01/16/14   Meredeth Ide, MD  cephALEXin (KEFLEX) 500 MG capsule Take 1 capsule (500 mg total) by mouth 2 (two) times daily. Patient not taking: No sig reported 01/16/14   Meredeth Ide, MD  FLUoxetine (PROZAC) 10 MG capsule Take 1 capsule (10 mg total) by mouth daily. Patient not taking: Reported on 05/30/2020 01/11/14   Clydia Llano, MD  Zinc Oxide (DESITIN MAXIMUM STRENGTH) 40 % PSTE Apply to affected area 3 times a day Patient not taking: Reported on 05/30/2020 01/11/14   Clydia Llano, MD    Scheduled Meds: . aspirin EC  81 mg Oral Daily  . feeding supplement  237 mL Oral BID BM  . heparin  5,000 Units Subcutaneous Q8H  . pantoprazole  40 mg Oral Daily   Continuous Infusions: . cefTRIAXone (ROCEPHIN)  IV 1 g (05/30/20 2015)  PRN Meds:.acetaminophen **OR** acetaminophen, bisacodyl, magnesium hydroxide, ondansetron **OR** ondansetron (ZOFRAN) IV  Allergies as of 05/30/2020  . (No Known Allergies)    No family history on file.  Social History   Socioeconomic History  . Marital status: Single    Spouse name: Not on file  . Number of children: Not on file  . Years of education: Not on file  . Highest education level: Not on file  Occupational History  . Not on file  Tobacco Use  . Smoking status: Never Smoker  . Smokeless tobacco: Never Used  Substance and Sexual Activity  . Alcohol use: No  . Drug use: No  .  Sexual activity: Never  Other Topics Concern  . Not on file  Social History Narrative  . Not on file   Social Determinants of Health   Financial Resource Strain: Not on file  Food Insecurity: Not on file  Transportation Needs: Not on file  Physical Activity: Not on file  Stress: Not on file  Social Connections: Not on file  Intimate Partner Violence: Not on file    Review of Systems: Unable to obtain ROS (patient hard of hearing, unable to hear ROS questions)  Physical Exam: Physical Exam Vitals reviewed.  Constitutional:      General: She is not in acute distress. HENT:     Head: Normocephalic and atraumatic.     Nose: Nose normal. No congestion.     Mouth/Throat:     Mouth: Mucous membranes are moist.     Pharynx: Oropharynx is clear.  Eyes:     General: No scleral icterus.    Extraocular Movements: Extraocular movements intact.     Conjunctiva/sclera: Conjunctivae normal.  Cardiovascular:     Rate and Rhythm: Normal rate and regular rhythm.     Pulses: Normal pulses.  Pulmonary:     Effort: Pulmonary effort is normal. No respiratory distress.     Breath sounds: Normal breath sounds.  Abdominal:     General: There is no distension.     Palpations: Abdomen is soft. There is no mass.     Tenderness: There is abdominal tenderness (LLQ). There is guarding. There is no rebound.     Hernia: No hernia is present.  Musculoskeletal:        General: No tenderness.     Cervical back: Normal range of motion and neck supple.     Right lower leg: Edema present.     Left lower leg: Edema present.  Skin:    General: Skin is warm and dry.  Neurological:     General: No focal deficit present.     Mental Status: She is oriented to person, place, and time. She is lethargic.  Psychiatric:        Mood and Affect: Mood normal.        Behavior: Behavior normal. Behavior is cooperative.      Vital signs: Vitals:   05/31/20 0430 05/31/20 0803  BP: (!) 152/78 (!) 150/90   Pulse: 78 93  Resp:  20  Temp: 98.1 F (36.7 C) 97.9 F (36.6 C)  SpO2: 100% 100%   Last BM Date: 05/27/20  GI:  Lab Results: Recent Labs    05/30/20 1400 05/31/20 0615  WBC 11.5* 7.6  HGB 7.3* 10.0*  HCT 23.0* 31.1*  PLT 288 195   BMET Recent Labs    05/30/20 1400 05/31/20 0615  NA 135 136  K 4.3 3.9  CL 107 108  CO2 20* 17*  GLUCOSE 108* 78  BUN 31* 29*  CREATININE 1.13* 1.09*  CALCIUM 8.4* 8.7*   LFT Recent Labs    05/31/20 0615  PROT 5.6*  ALBUMIN 2.8*  AST 19  ALT 10  ALKPHOS 52  BILITOT 1.0   PT/INR No results for input(s): LABPROT, INR in the last 72 hours.   Studies/Results: CT Abdomen Pelvis W Contrast  Result Date: 05/30/2020 CLINICAL DATA:  Left lower quadrant abdominal pain EXAM: CT ABDOMEN AND PELVIS WITH CONTRAST TECHNIQUE: Multidetector CT imaging of the abdomen and pelvis was performed using the standard protocol following bolus administration of intravenous contrast. CONTRAST:  OMNIPAQUE IOHEXOL 300 MG/ML  SOLN COMPARISON:  01/07/2014 FINDINGS: Lower chest: No acute pleural or parenchymal lung disease. Calcified granuloma right middle lobe. Hepatobiliary: Evaluation limited by respiratory motion. No focal liver abnormalities. Gallbladder is decompressed. No evidence of cholelithiasis. Borderline dilation of the common bile duct measuring 11 mm, likely appropriate for patient age. No evidence of choledocholithiasis. Pancreas: Unremarkable. No pancreatic ductal dilatation or surrounding inflammatory changes. Spleen: Normal in size without focal abnormality. Adrenals/Urinary Tract: Respiratory motion limits evaluation. There is bilateral renal cortical atrophy. Subcentimeter cortical cyst upper pole right kidney. No urinary tract calculi or obstructive uropathy. The bladder is unremarkable. Adrenals are normal. Stomach/Bowel: Evaluation limited by respiratory motion. There is no bowel obstruction or ileus. Diverticulosis of the descending and  sigmoid colon without diverticulitis. Moderate retained gas and stool within the rectal vault. No bowel wall thickening or inflammatory changes. Vascular/Lymphatic: Aortic atherosclerosis. No enlarged abdominal or pelvic lymph nodes. Reproductive: Uterus and bilateral adnexa are unremarkable. Other: No free fluid or free gas.  No abdominal wall hernia. Musculoskeletal: No acute or destructive bony lesions. Reconstructed images demonstrate no additional findings. IMPRESSION: 1. Diverticulosis without diverticulitis. 2. Borderline dilation of the common bile duct may be normal for age. No evidence of choledocholithiasis or cholelithiasis. 3. Otherwise no acute intra-abdominal or intrapelvic process on this study limited by respiratory motion. Electronically Signed   By: Sharlet Salina M.D.   On: 05/30/2020 16:00    Impression: LLQ pain, likely due to constipation -CT yesterday showed moderate retained gas and stool within the rectal vault. No bowel wall thickening or inflammatory changes.  Diverticulosis without diverticulitis.  Acute on chronic anemia -Hgb 7.3 on arrival, improved to 10.0 after 2u pRBCs.  Baseline Hgb 10.5 as of 01/2019 -BUN elevated as compared to baseline (29 today, baseline 14 as of 01/2019), could be suggestive of upper GI bleeding -Hemodynamically stable (actually, she is slightly hypertensive) with regular HR  Plan: Discussed with son, who thinks patient would prefer to hold off on endoscopic evaluation unless necessary.  Clear liquid diet for now, NPO after midnight in case patient continues to have drop in Hgb, at which point we will consider EGD tomorrow.  Protonix 40 mg IV BID.  Bowel regimen: Dulcolax suppository BID, Miralax TID.  Continue to monitor H&H with transfusion as needed to maintain Hgb >7.  [facility] will follow.   LOS: 0 days   Edrick Kins  PA-C 05/31/2020, 8:55 AM  Contact #  [phone]

## 2020-06-01 DIAGNOSIS — R1032 Left lower quadrant pain: Secondary | ICD-10-CM | POA: Diagnosis not present

## 2020-06-01 DIAGNOSIS — R627 Adult failure to thrive: Secondary | ICD-10-CM | POA: Diagnosis not present

## 2020-06-01 DIAGNOSIS — K5900 Constipation, unspecified: Secondary | ICD-10-CM | POA: Diagnosis not present

## 2020-06-01 LAB — COMPREHENSIVE METABOLIC PANEL
ALT: 13 U/L (ref 0–44)
AST: 20 U/L (ref 15–41)
Albumin: 2.9 g/dL — ABNORMAL LOW (ref 3.5–5.0)
Alkaline Phosphatase: 56 U/L (ref 38–126)
Anion gap: 10 (ref 5–15)
BUN: 25 mg/dL — ABNORMAL HIGH (ref 8–23)
CO2: 20 mmol/L — ABNORMAL LOW (ref 22–32)
Calcium: 8.9 mg/dL (ref 8.9–10.3)
Chloride: 105 mmol/L (ref 98–111)
Creatinine, Ser: 1.21 mg/dL — ABNORMAL HIGH (ref 0.44–1.00)
GFR, Estimated: 41 mL/min — ABNORMAL LOW (ref >60)
Glucose, Bld: 83 mg/dL (ref 70–99)
Potassium: 3.9 mmol/L (ref 3.5–5.1)
Sodium: 135 mmol/L (ref 135–145)
Total Bilirubin: 1 mg/dL (ref 0.3–1.2)
Total Protein: 5.9 g/dL — ABNORMAL LOW (ref 6.5–8.1)

## 2020-06-01 LAB — CBC
HCT: 33 % — ABNORMAL LOW (ref 36.0–46.0)
Hemoglobin: 11 g/dL — ABNORMAL LOW (ref 12.0–15.0)
MCH: 27.9 pg (ref 26.0–34.0)
MCHC: 33.3 g/dL (ref 30.0–36.0)
MCV: 83.8 fL (ref 80.0–100.0)
Platelets: 196 10*3/uL (ref 150–400)
RBC: 3.94 MIL/uL (ref 3.87–5.11)
RDW: 21.2 % — ABNORMAL HIGH (ref 11.5–15.5)
WBC: 8 10*3/uL (ref 4.0–10.5)
nRBC: 0 % (ref 0.0–0.2)

## 2020-06-01 MED ORDER — PSYLLIUM 95 % PO PACK
1.0000 | PACK | Freq: Two times a day (BID) | ORAL | Status: DC
  Administered 2020-06-01 – 2020-06-02 (×3): 1 via ORAL
  Filled 2020-06-01 (×3): qty 1

## 2020-06-01 MED ORDER — SODIUM CHLORIDE 0.9 % IV SOLN: INTRAVENOUS | Status: DC

## 2020-06-01 MED ORDER — PANTOPRAZOLE SODIUM 40 MG PO TBEC
40.0000 mg | DELAYED_RELEASE_TABLET | Freq: Every day | ORAL | Status: DC
  Administered 2020-06-02: 40 mg via ORAL

## 2020-06-01 NOTE — Progress Notes (Signed)
[facility]  PROGRESS NOTE  Elizabeth Jarvis MHD:[id number] DOB: [birth year] DOA: 05/30/2020 PCP: Pcp, No   Brief HPI:   87 year old female who is being seen by [facility] with medical history of hypertension, GERD, lower extremity lymphedema presented with left lower quadrant abdominal pain.  Per family patient has issues with constipation in the recent past.  She has not had BM for past 3 days.  They have been trying homemade recipes to treat constipation.  She had one large BM 10 days ago at that time it was black-colored stool.  In the ED CT of the abdomen pelvis was done which showed diverticulosis without radiculitis.  Borderline dilatation of the common bile duct may be normal for age.  2 units PRBC were ordered for anemia, her hemoglobin was 7.3.  GI was consulted.    Subjective   Patient seen and examined, sitting and eating in bed comfortably.  Denies abdominal pain.  She had multiple BMs with p.o. MiraLAX and Dulcolax suppository regimen.   Assessment/Plan:     1. Left lower quadrant abdominal pain-CT abdomen pelvis negative for diverticulitis but shows diverticulosis.  GI has been consulted, patient started on MiraLAX 17 g 3 times daily along with daily Dulcolax suppository.  She also received 1 tapwater enema last night.  Patient had multiple bowel movements.  Constipation has resolved.  GI recommends to start taking Metamucil daily or twice daily basis regularly. 2. Anemia/positive FOBT-hemoglobin is 10.0.  Patient received 2 units PRBC last night.  GI has been consulted, she has FOBT positive.  Due to her advanced age and no obvious melena or hematochezia, GI recommends conservative management.  Protonix dose increased to 40 mg twice a day.  Aspirin has been discontinued.  Patient can be discharged on Protonix 40 mg p.o. daily. 3. Lower extremity edema-secondary to lymphedema. 4. Acute kidney injury-mild likely from poor p.o. intake.  Start normal saline at 75 mill per hour for  24 hours.  Follow BMP in am 5. Failure to thrive-nutrition consulted.  TSH 3.946.     COVID-19 Labs  Recent Labs    05/30/20 1740  FERRITIN 10*    Lab Results  Component Value Date   SARSCOV2NAA NEGATIVE 05/30/2020     Scheduled medications:   . feeding supplement  237 mL Oral BID BM  . pantoprazole (PROTONIX) IV  40 mg Intravenous Q12H  . psyllium  1 packet Oral BID         CBG: No results for input(s): GLUCAP in the last 168 hours.  SpO2: 100 %    CBC: Recent Labs  Lab 05/30/20 1400 05/31/20 0615 06/01/20 0639  WBC 11.5* 7.6 8.0  HGB 7.3* 10.0* 11.0*  HCT 23.0* 31.1* 33.0*  MCV 93.5 86.1 83.8  PLT 288 195 196    Basic Metabolic Panel: Recent Labs  Lab 05/30/20 1400 05/31/20 0615 06/01/20 0639  NA 135 136 135  K 4.3 3.9 3.9  CL 107 108 105  CO2 20* 17* 20*  GLUCOSE 108* 78 83  BUN 31* 29* 25*  CREATININE 1.13* 1.09* 1.21*  CALCIUM 8.4* 8.7* 8.9     Liver Function Tests: Recent Labs  Lab 05/30/20 1400 05/31/20 0615 06/01/20 0639  AST 16 19 20   ALT 12 10 13   ALKPHOS 63 52 56  BILITOT 0.6 1.0 1.0  PROT 6.7 5.6* 5.9*  ALBUMIN 3.3* 2.8* 2.9*     Antibiotics: Anti-infectives (From admission, onward)   Start     Dose/Rate Route Frequency Ordered  Stop   05/30/20 1800  cefTRIAXone (ROCEPHIN) 1 g in sodium chloride 0.9 % 100 mL IVPB  Status:  Discontinued        1 g 200 mL/hr over 30 Minutes Intravenous Every 24 hours 05/30/20 1753 06/01/20 0952   05/30/20 1800  metroNIDAZOLE (FLAGYL) IVPB 500 mg  Status:  Discontinued        500 mg 100 mL/hr over 60 Minutes Intravenous Every 8 hours 05/30/20 1753 05/30/20 1822       DVT prophylaxis: SCDs  Code Status: Full code  Family Communication: No family at bedside   Consultants:  Gastroenterology  Procedures:      Objective   Vitals:   05/31/20 1157 05/31/20 2049 06/01/20 0500 06/01/20 1453  BP: (!) 160/76 124/61 134/64 (!) 146/68  Pulse: 77 73 74 90  Resp: 16 17 17 16    Temp: 98.2 F (36.8 C) 97.9 F (36.6 C) 97.9 F (36.6 C) 98 F (36.7 C)  TempSrc: Oral Oral Oral Oral  SpO2: 97% 100% 99% 100%  Weight:      Height:        Intake/Output Summary (Last 24 hours) at 06/01/2020 1548 Last data filed at 06/01/2020 1217 Gross per 24 hour  Intake 280 ml  Output 700 ml  Net -420 ml    01/23 1901 - 01/25 0700 In: 1658.8 [P.O.:210] Out: 400 [Urine:400]  Filed Weights   05/30/20 2130  Weight: 75.3 kg    Physical Examination:  General-appears in no acute distress Heart-S1-S2, regular, no murmur auscultated Lungs-clear to auscultation bilaterally, no wheezing or crackles auscultated Abdomen-soft, nontender, no organomegaly Extremities-bilateral lower extremity edema Neuro-alert, deaf.  Status is: Inpatient  Dispo: The patient is from: Home              Anticipated d/c is to: Home              Anticipated d/c date is: 06/02/2020              Patient currently not medically stable for discharge  Barrier to discharge-PT evaluation  Pressure Ulcer 01/13/14 Stage II -  Partial thickness loss of dermis presenting as a shallow open ulcer with a red, pink wound bed without slough. multiple sites, pink, bleeding (Active)  01/13/14 1752  Location: Buttocks  Location Orientation: Right  Staging: Stage II -  Partial thickness loss of dermis presenting as a shallow open ulcer with a red, pink wound bed without slough.  Wound Description (Comments): multiple sites, pink, bleeding  Present on Admission: Yes     Pressure Ulcer 01/13/14 Stage II -  Partial thickness loss of dermis presenting as a shallow open ulcer with a red, pink wound bed without slough. pink, 3 diff sites (Active)  01/13/14 1756  Location: Coccyx  Location Orientation:   Staging: Stage II -  Partial thickness loss of dermis presenting as a shallow open ulcer with a red, pink wound bed without slough.  Wound Description (Comments): pink, 3 diff sites  Present on Admission: Yes            Data Reviewed:   Recent Results (from the past 240 hour(s))  SARS Coronavirus 2 by RT PCR (hospital order, performed in [facility] hospital lab) Nasopharyngeal Nasopharyngeal Swab     Status: None   Collection Time: 05/30/20  8:36 PM   Specimen: Nasopharyngeal Swab  Result Value Ref Range Status   SARS Coronavirus 2 NEGATIVE NEGATIVE Final    Comment: (NOTE) SARS-CoV-2 target nucleic acids  are NOT DETECTED.  The SARS-CoV-2 RNA is generally detectable in upper and lower respiratory specimens during the acute phase of infection. The lowest concentration of SARS-CoV-2 viral copies this assay can detect is 250 copies / mL. A negative result does not preclude SARS-CoV-2 infection and should not be used as the sole basis for treatment or other patient management decisions.  A negative result may occur with improper specimen collection / handling, submission of specimen other than nasopharyngeal swab, presence of viral mutation(s) within the areas targeted by this assay, and inadequate number of viral copies (<250 copies / mL). A negative result must be combined with clinical observations, patient history, and epidemiological information.  Fact Sheet for Patients:   BoilerBrush.com.cy  Fact Sheet for Healthcare Providers: https://pope.com/  This test is not yet approved or  cleared by the [place] FDA and has been authorized for detection and/or diagnosis of SARS-CoV-2 by FDA under an Emergency Use Authorization (EUA).  This EUA will remain in effect (meaning this test can be used) for the duration of the COVID-19 declaration under Section 564(b)(1) of the Act, 21 U.S.C. section 360bbb-3(b)(1), unless the authorization is terminated or revoked sooner.  Performed at [facility], [address] [place], [place] [address]     Recent Labs  Lab 05/30/20 1400  LIPASE 34   No results for  input(s): AMMONIA in the last 168 hours.  Cardiac Enzymes: No results for input(s): CKTOTAL, CKMB, CKMBINDEX, TROPONINI in the last 168 hours. BNP (last 3 results) Recent Labs    05/30/20 1400  BNP 96.0       Cindra Austad S Tahjanae Blankenburg   [facility] If 7PM-7AM, please contact night-coverage at [email or web address], Office  [phone]   06/01/2020, 3:48 PM  LOS: 1 day

## 2020-06-01 NOTE — Progress Notes (Signed)
[facility] Gastroenterology Progress Note  Elizabeth Jarvis 87 y.o. [birth year]  CC:  Anemia  Subjective: Patient states abdominal pain has improved and is now minimal.  She is hungry and requests food.  She is feeling frustrated because she cannot find her hearing aid.  Per RN, scant pink at the end of BM yesterday.  ROS : Review of Systems  Cardiovascular: Negative for chest pain and palpitations.  Gastrointestinal: Positive for blood in stool and constipation. Negative for abdominal pain, diarrhea, heartburn, melena, nausea and vomiting.    Objective: Vital signs in last 24 hours: Vitals:   05/31/20 2049 06/01/20 0500  BP: 124/61 134/64  Pulse: 73 74  Resp: 17 17  Temp: 97.9 F (36.6 C) 97.9 F (36.6 C)  SpO2: 100% 99%    Physical Exam:  General:  Sleeping but easily arouses to voice alone, oriented, cooperative, no distress  Head:  Normocephalic, without obvious abnormality, atraumatic  Eyes:  Anicteric sclera, EOMs intact  Lungs:   Clear to auscultation bilaterally, respirations unlabored  Heart:  Regular rate and rhythm, S1, S2 normal  Abdomen:   Soft, non-tender, non-distended, bowel sounds active all four quadrants,  no guarding or peritoneal signs  Rectal: Performed by Dr. Marca Ancona, NT and I were present.  Rectal vault empty, trace of brown stool.  No abnormalities on DRE. No external hemorrhoids.   Extremities: +bilateral lower extremity edema; SCDs in place  Pulses: Radial pulses 2+ and symmetric    Lab Results: Recent Labs    05/31/20 0615 06/01/20 0639  NA 136 135  K 3.9 3.9  CL 108 105  CO2 17* 20*  GLUCOSE 78 83  BUN 29* 25*  CREATININE 1.09* 1.21*  CALCIUM 8.7* 8.9   Recent Labs    05/31/20 0615 06/01/20 0639  AST 19 20  ALT 10 13  ALKPHOS 52 56  BILITOT 1.0 1.0  PROT 5.6* 5.9*  ALBUMIN 2.8* 2.9*   Recent Labs    05/31/20 0615 06/01/20 0639  WBC 7.6 8.0  HGB 10.0* 11.0*  HCT 31.1* 33.0*  MCV 86.1 83.8  PLT 195 196   No results for  input(s): LABPROT, INR in the last 72 hours.   Assessment/Plan: LLQ pain, likely due to constipation -CT yesterday showed moderate retained gas and stool within the rectal vault. No bowel wall thickening or inflammatory changes.  Diverticulosis without diverticulitis.  Acute on chronic anemia -Hgb continues to improve, now 11.0.  Hgb 7.3 on arrival, improved to 10.0 1/24 after 2u pRBCs.  Baseline Hgb 10.5 as of 01/2019 -BUN decreasing, now 25 (31 on arrival) -Scant pink blood after hard stool, most likely related to constipation (possible internal hemorrhoids)  Plan: Soft diet.  Continue Protonix 40 mg IV BID.  We will transition to PO dosing at discharge.  Start Metamucil BID.  Stop scheduled Miralax and dulcolax.  Stop empiric antibiotics, no evidence of diverticulitis.  Continue to monitor H&H with transfusion as needed to maintain Hgb >7.  Plan for discharge tomorrow if patient remains stable.  H pylori stool Ag and/or serologies prior to discharge.  [facility] will follow.  Edrick Kins PA-C 06/01/2020, 9:03 AM  Contact #  [phone]

## 2020-06-02 DIAGNOSIS — K922 Gastrointestinal hemorrhage, unspecified: Secondary | ICD-10-CM

## 2020-06-02 DIAGNOSIS — R1032 Left lower quadrant pain: Secondary | ICD-10-CM | POA: Diagnosis not present

## 2020-06-02 LAB — COMPREHENSIVE METABOLIC PANEL
ALT: 10 U/L (ref 0–44)
AST: 16 U/L (ref 15–41)
Albumin: 2.8 g/dL — ABNORMAL LOW (ref 3.5–5.0)
Alkaline Phosphatase: 53 U/L (ref 38–126)
Anion gap: 8 (ref 5–15)
BUN: 27 mg/dL — ABNORMAL HIGH (ref 8–23)
CO2: 20 mmol/L — ABNORMAL LOW (ref 22–32)
Calcium: 8.7 mg/dL — ABNORMAL LOW (ref 8.9–10.3)
Chloride: 107 mmol/L (ref 98–111)
Creatinine, Ser: 1.11 mg/dL — ABNORMAL HIGH (ref 0.44–1.00)
GFR, Estimated: 45 mL/min — ABNORMAL LOW (ref >60)
Glucose, Bld: 95 mg/dL (ref 70–99)
Potassium: 3.6 mmol/L (ref 3.5–5.1)
Sodium: 135 mmol/L (ref 135–145)
Total Bilirubin: 0.7 mg/dL (ref 0.3–1.2)
Total Protein: 5.5 g/dL — ABNORMAL LOW (ref 6.5–8.1)

## 2020-06-02 LAB — CBC
HCT: 30.7 % — ABNORMAL LOW (ref 36.0–46.0)
Hemoglobin: 10.2 g/dL — ABNORMAL LOW (ref 12.0–15.0)
MCH: 28.3 pg (ref 26.0–34.0)
MCHC: 33.2 g/dL (ref 30.0–36.0)
MCV: 85 fL (ref 80.0–100.0)
Platelets: 198 10*3/uL (ref 150–400)
RBC: 3.61 MIL/uL — ABNORMAL LOW (ref 3.87–5.11)
RDW: 20.5 % — ABNORMAL HIGH (ref 11.5–15.5)
WBC: 8.1 10*3/uL (ref 4.0–10.5)
nRBC: 0 % (ref 0.0–0.2)

## 2020-06-02 LAB — H. PYLORI ANTIGEN, STOOL

## 2020-06-02 MED ORDER — PANTOPRAZOLE SODIUM 40 MG PO TBEC: 40.0000 mg | DELAYED_RELEASE_TABLET | Freq: Two times a day (BID) | ORAL | 1 refills | Status: DC

## 2020-06-02 MED ORDER — PSYLLIUM 95 % PO PACK: 1.0000 | PACK | Freq: Two times a day (BID) | ORAL | Status: AC

## 2020-06-02 MED ORDER — POLYETHYLENE GLYCOL 3350 17 G PO PACK: 17.0000 g | PACK | Freq: Two times a day (BID) | ORAL | 0 refills | Status: AC

## 2020-06-02 MED ORDER — ONDANSETRON HCL 4 MG PO TABS: 4.0000 mg | ORAL_TABLET | Freq: Four times a day (QID) | ORAL | 0 refills | Status: AC | PRN

## 2020-06-02 NOTE — Discharge Summary (Signed)
Physician Discharge Summary  Elizabeth Jarvis [facility]:[id number] DOB: 09-Jun-1921 DOA: 05/30/2020  PCP: Oneita Hurt, No  Admit date: 05/30/2020 Discharge date: 06/02/2020  Admitted From: Home Disposition: Home Recommendations for Outpatient Follow-up:  1. Follow up with PCP in 1-2 weeks 2. Please obtain BMP/CBC in one week 3. Please follow up on the following pending results: Follow-up with Dr. Marca Ancona for H. pylori pending at the time of discharge  Home Health: None Equipment/Devices: None Discharge Condition: Stable CODE STATUS: Full code Diet recommendation: Cardiac diet Brief/Interim Summary:87 year old female who is being seen by [facility] with medical history of hypertension, GERD, lower extremity lymphedema presented with left lower quadrant abdominal pain.  Per family patient has issues with constipation in the recent past.  She has not had BM for past 3 days.  They have been trying homemade recipes to treat constipation.  She had one large BM 10 days ago at that time it was black-colored stool.  In the ED CT of the abdomen pelvis was done which showed diverticulosis without radiculitis.  Borderline dilatation of the common bile duct may be normal for age.  2 units PRBC were ordered for anemia, her hemoglobin was 7.3.  GI was consulted.   Discharge Diagnoses:  Active Problems:   Edema of both legs   Weakness generalized   Abdominal pain   Acute lower UTI   Anemia   Failure to thrive in adult   1.   Left lower quadrant abdominal pain-CT abdomen pelvis negative for diverticulitis but shows diverticulosis.  Pain was thought to be likely due to constipation.  She had moderate retained gas and stool within the rectal vault.  No bowel wall thickening or inflammatory changes.  She was treated with Metamucil twice a day and MiraLAX and Dulcolax.  Up with GI after discharge with Dr. Marca Ancona and follow-up H. pylori.   GI recommends to start taking Metamucil daily or twice daily basis  regularly. 2. Anemia/positive FOBT-hemoglobin is 10.0.  Patient received 2 units PRBC during this hospital stay. Due to her advanced age and no obvious melena or hematochezia, GI recommends conservative management.  Protonix dose increased to 40 mg twice a day.  Aspirin has been discontinued.  Patient can be discharged on Protonix 40 mg p.o. twice a day for 2 months then Protonix 40 mg daily indefinitely. 3. Lower extremity edema-secondary to lymphedema. 4. Acute kidney injury-resolved with IV fluids.   5. Failure to thrive-normal TSH.  Nutrition was consulted.  PT was consulted did not recommend any outpatient physical therapy at this time.   Nutrition Problem: Inadequate oral intake Etiology: poor appetite,constipation    Signs/Symptoms: per patient/family report     Interventions: Ensure Enlive (each supplement provides 350kcal and 20 grams of protein)  Estimated body mass index is 32.42 kg/m as calculated from the following:   Height as of this encounter: 5' (1.524 m).   Weight as of this encounter: 75.3 kg.  Discharge Instructions  Discharge Instructions    Diet - low sodium heart healthy   Complete by: As directed    Increase activity slowly   Complete by: As directed      Allergies as of 06/02/2020   No Known Allergies     Medication List    STOP taking these medications   amLODipine 5 MG tablet Commonly known as: NORVASC   aspirin EC 81 MG tablet   cephALEXin 500 MG capsule Commonly known as: Keflex   FLUoxetine 10 MG capsule Commonly known as: PROzac  omeprazole 20 MG capsule Commonly known as: PRILOSEC   Zinc Oxide 40 % Pste Commonly known as: Desitin Maximum Strength     TAKE these medications   b complex vitamins tablet Take 1 tablet by mouth daily.   BLINK TEARS OP Place 1 drop into both eyes 3 (three) times daily.   calcium-vitamin D 500-200 MG-UNIT tablet Commonly known as: OSCAL WITH D Take 1 tablet by mouth daily with breakfast.    DULCOLAX PO Take 1 tablet by mouth daily as needed (constipation).   ondansetron 4 MG tablet Commonly known as: ZOFRAN Take 1 tablet (4 mg total) by mouth every 6 (six) hours as needed for nausea.   pantoprazole 40 MG tablet Commonly known as: PROTONIX Take 1 tablet (40 mg total) by mouth 2 (two) times daily.   polyethylene glycol 17 g packet Commonly known as: MiraLax Take 17 g by mouth 2 (two) times daily.   psyllium 95 % Pack Commonly known as: HYDROCIL/METAMUCIL Take 1 packet by mouth 2 (two) times daily.       Follow-up Information    Kerin Salen, MD Follow up.   Specialty: Gastroenterology Contact information: [address] [place] [address] [place] [address] [phone]              No Known Allergies  Consultations: GI Dr. Marca Ancona  Procedures/Studies: CT Abdomen Pelvis W Contrast  Result Date: 05/30/2020 CLINICAL DATA:  Left lower quadrant abdominal pain EXAM: CT ABDOMEN AND PELVIS WITH CONTRAST TECHNIQUE: Multidetector CT imaging of the abdomen and pelvis was performed using the standard protocol following bolus administration of intravenous contrast. CONTRAST:  OMNIPAQUE IOHEXOL 300 MG/ML  SOLN COMPARISON:  01/07/2014 FINDINGS: Lower chest: No acute pleural or parenchymal lung disease. Calcified granuloma right middle lobe. Hepatobiliary: Evaluation limited by respiratory motion. No focal liver abnormalities. Gallbladder is decompressed. No evidence of cholelithiasis. Borderline dilation of the common bile duct measuring 11 mm, likely appropriate for patient age. No evidence of choledocholithiasis. Pancreas: Unremarkable. No pancreatic ductal dilatation or surrounding inflammatory changes. Spleen: Normal in size without focal abnormality. Adrenals/Urinary Tract: Respiratory motion limits evaluation. There is bilateral renal cortical atrophy. Subcentimeter cortical cyst upper pole right kidney. No urinary tract calculi or obstructive uropathy. The bladder is  unremarkable. Adrenals are normal. Stomach/Bowel: Evaluation limited by respiratory motion. There is no bowel obstruction or ileus. Diverticulosis of the descending and sigmoid colon without diverticulitis. Moderate retained gas and stool within the rectal vault. No bowel wall thickening or inflammatory changes. Vascular/Lymphatic: Aortic atherosclerosis. No enlarged abdominal or pelvic lymph nodes. Reproductive: Uterus and bilateral adnexa are unremarkable. Other: No free fluid or free gas.  No abdominal wall hernia. Musculoskeletal: No acute or destructive bony lesions. Reconstructed images demonstrate no additional findings. IMPRESSION: 1. Diverticulosis without diverticulitis. 2. Borderline dilation of the common bile duct may be normal for age. No evidence of choledocholithiasis or cholelithiasis. 3. Otherwise no acute intra-abdominal or intrapelvic process on this study limited by respiratory motion. Electronically Signed   By: Sharlet Salina M.D.   On: 05/30/2020 16:00    (Echo, Carotid, EGD, Colonoscopy, ERCP)    Subjective: Patient resting in bed very hard of hearing awake alert no complaints wants to go home  Discharge Exam: Vitals:   06/01/20 2225 06/02/20 0455  BP: (!) 147/68 140/74  Pulse: 91 84  Resp: 17 17  Temp: 98.9 F (37.2 C) 98.2 F (36.8 C)  SpO2: 99% 98%   Vitals:   06/01/20 0500 06/01/20 1453 06/01/20 2225  06/02/20 0455  BP: 134/64 (!) 146/68 (!) 147/68 140/74  Pulse: 74 90 91 84  Resp: 17 16 17 17   Temp: 97.9 F (36.6 C) 98 F (36.7 C) 98.9 F (37.2 C) 98.2 F (36.8 C)  TempSrc: Oral Oral Oral Oral  SpO2: 99% 100% 99% 98%  Weight:      Height:        General: Pt is alert, awake, not in acute distress Cardiovascular: RRR, S1/S2 +, no rubs, no gallops Respiratory: CTA bilaterally, no wheezing, no rhonchi Abdominal: Soft, NT, ND, bowel sounds + Extremities: no edema, no cyanosis    The results of significant diagnostics from this hospitalization  (including imaging, microbiology, ancillary and laboratory) are listed below for reference.     Microbiology: Recent Results (from the past 240 hour(s))  SARS Coronavirus 2 by RT PCR (hospital order, performed in [facility] hospital lab) Nasopharyngeal Nasopharyngeal Swab     Status: None   Collection Time: 05/30/20  8:36 PM   Specimen: Nasopharyngeal Swab  Result Value Ref Range Status   SARS Coronavirus 2 NEGATIVE NEGATIVE Final    Comment: (NOTE) SARS-CoV-2 target nucleic acids are NOT DETECTED.  The SARS-CoV-2 RNA is generally detectable in upper and lower respiratory specimens during the acute phase of infection. The lowest concentration of SARS-CoV-2 viral copies this assay can detect is 250 copies / mL. A negative result does not preclude SARS-CoV-2 infection and should not be used as the sole basis for treatment or other patient management decisions.  A negative result may occur with improper specimen collection / handling, submission of specimen other than nasopharyngeal swab, presence of viral mutation(s) within the areas targeted by this assay, and inadequate number of viral copies (<250 copies / mL). A negative result must be combined with clinical observations, patient history, and epidemiological information.  Fact Sheet for Patients:   BoilerBrush.com.cy  Fact Sheet for Healthcare Providers: https://pope.com/  This test is not yet approved or  cleared by the [place] FDA and has been authorized for detection and/or diagnosis of SARS-CoV-2 by FDA under an Emergency Use Authorization (EUA).  This EUA will remain in effect (meaning this test can be used) for the duration of the COVID-19 declaration under Section 564(b)(1) of the Act, 21 U.S.C. section 360bbb-3(b)(1), unless the authorization is terminated or revoked sooner.  Performed at [facility] [place] [facility], [address], [place], [place]  [address]      Labs: BNP (last 3 results) Recent Labs    05/30/20 1400  BNP 96.0   Basic Metabolic Panel: Recent Labs  Lab 05/30/20 1400 05/31/20 0615 06/01/20 0639 06/02/20 0610  NA 135 136 135 135  K 4.3 3.9 3.9 3.6  CL 107 108 105 107  CO2 20* 17* 20* 20*  GLUCOSE 108* 78 83 95  BUN 31* 29* 25* 27*  CREATININE 1.13* 1.09* 1.21* 1.11*  CALCIUM 8.4* 8.7* 8.9 8.7*   Liver Function Tests: Recent Labs  Lab 05/30/20 1400 05/31/20 0615 06/01/20 0639 06/02/20 0610  AST 16 19 20 16   ALT 12 10 13 10   ALKPHOS 63 52 56 53  BILITOT 0.6 1.0 1.0 0.7  PROT 6.7 5.6* 5.9* 5.5*  ALBUMIN 3.3* 2.8* 2.9* 2.8*   Recent Labs  Lab 05/30/20 1400  LIPASE 34   No results for input(s): AMMONIA in the last 168 hours. CBC: Recent Labs  Lab 05/30/20 1400 05/31/20 0615 06/01/20 0639 06/02/20 0610  WBC 11.5* 7.6 8.0 8.1  HGB 7.3* 10.0* 11.0*  10.2*  HCT 23.0* 31.1* 33.0* 30.7*  MCV 93.5 86.1 83.8 85.0  PLT 288 195 196 198   Cardiac Enzymes: No results for input(s): CKTOTAL, CKMB, CKMBINDEX, TROPONINI in the last 168 hours. BNP: Invalid input(s): POCBNP CBG: No results for input(s): GLUCAP in the last 168 hours. D-Dimer No results for input(s): DDIMER in the last 72 hours. Hgb A1c No results for input(s): HGBA1C in the last 72 hours. Lipid Profile No results for input(s): CHOL, HDL, LDLCALC, TRIG, CHOLHDL, LDLDIRECT in the last 72 hours. Thyroid function studies No results for input(s): TSH, T4TOTAL, T3FREE, THYROIDAB in the last 72 hours.  Invalid input(s): FREET3 Anemia work up Recent Labs    05/30/20 1740  VITAMINB12 520  FOLATE 10.6  FERRITIN 10*  TIBC 317  IRON 35   Urinalysis    Component Value Date/Time   COLORURINE YELLOW 05/30/2020 1920   APPEARANCEUR CLEAR 05/30/2020 1920   LABSPEC 1.039 (H) 05/30/2020 1920   PHURINE 5.0 05/30/2020 1920   GLUCOSEU NEGATIVE 05/30/2020 1920   HGBUR SMALL (A) 05/30/2020 1920   BILIRUBINUR NEGATIVE 05/30/2020 1920    KETONESUR NEGATIVE 05/30/2020 1920   PROTEINUR NEGATIVE 05/30/2020 1920   UROBILINOGEN 1.0 01/13/2014 1354   NITRITE NEGATIVE 05/30/2020 1920   LEUKOCYTESUR NEGATIVE 05/30/2020 1920   Sepsis Labs Invalid input(s): PROCALCITONIN,  WBC,  LACTICIDVEN Microbiology Recent Results (from the past 240 hour(s))  SARS Coronavirus 2 by RT PCR (hospital order, performed in [facility] hospital lab) Nasopharyngeal Nasopharyngeal Swab     Status: None   Collection Time: 05/30/20  8:36 PM   Specimen: Nasopharyngeal Swab  Result Value Ref Range Status   SARS Coronavirus 2 NEGATIVE NEGATIVE Final    Comment: (NOTE) SARS-CoV-2 target nucleic acids are NOT DETECTED.  The SARS-CoV-2 RNA is generally detectable in upper and lower respiratory specimens during the acute phase of infection. The lowest concentration of SARS-CoV-2 viral copies this assay can detect is 250 copies / mL. A negative result does not preclude SARS-CoV-2 infection and should not be used as the sole basis for treatment or other patient management decisions.  A negative result may occur with improper specimen collection / handling, submission of specimen other than nasopharyngeal swab, presence of viral mutation(s) within the areas targeted by this assay, and inadequate number of viral copies (<250 copies / mL). A negative result must be combined with clinical observations, patient history, and epidemiological information.  Fact Sheet for Patients:   BoilerBrush.com.cy  Fact Sheet for Healthcare Providers: https://pope.com/  This test is not yet approved or  cleared by the [place] FDA and has been authorized for detection and/or diagnosis of SARS-CoV-2 by FDA under an Emergency Use Authorization (EUA).  This EUA will remain in effect (meaning this test can be used) for the duration of the COVID-19 declaration under Section 564(b)(1) of the Act, 21 U.S.C. section  360bbb-3(b)(1), unless the authorization is terminated or revoked sooner.  Performed at [facility], [address], [place], [place] [address]      Time coordinating discharge: 38 minutes  SIGNED:   Alwyn Ren, MD  [facility] 06/02/2020, 4:23 PM

## 2020-06-02 NOTE — Progress Notes (Addendum)
[facility] Progress Note  Elizabeth Jarvis 87 y.o. 1921/11/02  CC:  Anemia  Subjective: Patient states abdominal pain has improved.  She denies any complaints, and she states she wants to go home.  2 BMs yesterday per flow sheet (one documented as brown, other without descriptor).  ROS : Review of Systems  Cardiovascular: Negative for chest pain and palpitations.  Gastrointestinal: Negative for abdominal pain, blood in stool, constipation, diarrhea, heartburn, melena, nausea and vomiting.    Objective: Vital signs in last 24 hours: Vitals:   06/01/20 2225 06/02/20 0455  BP: (!) 147/68 140/74  Pulse: 91 84  Resp: 17 17  Temp: 98.9 F (37.2 C) 98.2 F (36.8 C)  SpO2: 99% 98%    Physical Exam:  General:  Alert, oriented, cooperative, no distress  Head:  Normocephalic, without obvious abnormality, atraumatic  Eyes:  Anicteric sclera, EOMs intact  Lungs:   Breathing comfortably on room air, respirations unlabored  Heart:  Regular rate and rhythm, S1, S2 normal  Abdomen:   Soft, non-tender, non-distended, no guarding or peritoneal signs  Extremities: +bilateral lower extremity edema; SCDs in place  Pulses: Radial pulses 2+ and symmetric    Lab Results: Recent Labs    06/01/20 0639 06/02/20 0610  NA 135 135  K 3.9 3.6  CL 105 107  CO2 20* 20*  GLUCOSE 83 95  BUN 25* 27*  CREATININE 1.21* 1.11*  CALCIUM 8.9 8.7*   Recent Labs    06/01/20 0639 06/02/20 0610  AST 20 16  ALT 13 10  ALKPHOS 56 53  BILITOT 1.0 0.7  PROT 5.9* 5.5*  ALBUMIN 2.9* 2.8*   Recent Labs    06/01/20 0639 06/02/20 0610  WBC 8.0 8.1  HGB 11.0* 10.2*  HCT 33.0* 30.7*  MCV 83.8 85.0  PLT 196 198   No results for input(s): LABPROT, INR in the last 72 hours.   Assessment/Plan: LLQ pain, likely due to constipation, resolved -CT yesterday showed moderate retained gas and stool within the rectal vault. No bowel wall thickening or inflammatory changes.  Diverticulosis without  diverticulitis.  Acute on chronic anemia -Hgb 10.2, stable. No melena or hematochezia -BUN remains stable  Plan: OK to discharge from a GI standpoint.  Continue Protonix 40 mg PO BID for 2 months, then decrease to once daily dosing indefinitely.  Continue Metamucil BID.  Miralax and/or Dulcolax suppositories can be used PRN for constipation.    If H pylori is positive, we will plan to treat.  Recheck Hgb in 1-2 weeks with PCP or [facility].  [facility] will sign off. Please contact us if we can be of any further assistance during this hospital stay.  Edrick Kins PA-C 06/02/2020, 9:26 AM  Contact #  [phone]

## 2020-06-02 NOTE — Progress Notes (Signed)
Pt discharged home with all belongings. Discharge instructions reviewed with pt and pt's son. No questions or concerns at time of discharge.

## 2020-06-02 NOTE — Evaluation (Signed)
Physical Therapy Evaluation Patient Details Name: Elizabeth Jarvis MRN: [id number] DOB: 11-06-1921 Today's Date: 06/02/2020   History of Present Illness  87 year old female with medical history of hypertension, GERD, lower extremity lymphedema presented 05/30/20 with left lower quadrant abdominal pain, constipation ,  In the ED CT of the abdomen pelvis was done which showed diverticulosis without radiculitis,given   2 units PRBC  anemia, her hemoglobin was 7.3.  Clinical Impression  The patient speaks/understands instructions well, difficulty getting information related to what she resides in.Nonethe less, patient ambulated in room with supervision using Rw , no assistance for bed mobility.  Patient should return to her home with family. Pt admitted with above diagnosis.  Pt currently with functional limitations due to the deficits listed below (see PT Problem List). Pt will benefit from skilled PT to increase their independence and safety with mobility to allow discharge to the venue listed below.       Follow Up Recommendations No PT follow up    Equipment Recommendations  None recommended by PT    Recommendations for Other Services       Precautions / Restrictions Precautions Precautions: Fall      Mobility  Bed Mobility Overal bed mobility: Independent                  Transfers Overall transfer level: Needs assistance Equipment used: Rolling walker (2 wheeled) Transfers: Sit to/from Stand              Ambulation/Gait Ambulation/Gait assistance: Supervision Gait Distance (Feet): 40 Feet Assistive device: Rolling walker (2 wheeled) Gait Pattern/deviations: Step-through pattern   Gait velocity interpretation: <1.31 ft/sec, indicative of household ambulator General Gait Details: around objects, no balance loss  Stairs            Wheelchair Mobility    Modified Rankin (Stroke Patients Only)       Balance Overall balance assessment: No  apparent balance deficits (not formally assessed)                                           Pertinent Vitals/Pain Pain Assessment: No/denies pain    Home Living Family/patient expects to be discharged to:: Private residence Living Arrangements: Children;Other relatives Available Help at Discharge: Available PRN/intermittently Type of Home: Apartment         Home Equipment: Walker - 4 wheels;Walker - 2 wheels Additional Comments: appears lives in apartment, unsure if any stairs. Patient speaks some  English  but is HOH so difficult with details    Prior Function           Comments: ambulates with RW     Hand Dominance        Extremity/Trunk Assessment   Upper Extremity Assessment Upper Extremity Assessment: Generalized weakness    Lower Extremity Assessment Lower Extremity Assessment: Generalized weakness    Cervical / Trunk Assessment Cervical / Trunk Assessment: Normal  Communication   Communication: HOH  Cognition Arousal/Alertness: Awake/alert Behavior During Therapy: WFL for tasks assessed/performed Overall Cognitive Status: Within Functional Limits for tasks assessed                                        General Comments      Exercises     Assessment/Plan    PT  Assessment Patient needs continued PT services  PT Problem List Decreased strength;Decreased mobility;Decreased activity tolerance       PT Treatment Interventions DME instruction;Therapeutic activities;Gait training;Therapeutic exercise;Patient/family education;Functional mobility training    PT Goals (Current goals can be found in the Care Plan section)  Acute Rehab PT Goals Patient Stated Goal: to go home PT Goal Formulation: With patient Time For Goal Achievement: 06/16/20 Potential to Achieve Goals: Good    Frequency Min 3X/week   Barriers to discharge        Co-evaluation               AM-PAC PT "6 Clicks" Mobility  Outcome  Measure Help needed turning from your back to your side while in a flat bed without using bedrails?: None Help needed moving from lying on your back to sitting on the side of a flat bed without using bedrails?: None Help needed moving to and from a bed to a chair (including a wheelchair)?: A Little Help needed standing up from a chair using your arms (e.g., wheelchair or bedside chair)?: A Little Help needed to walk in hospital room?: A Little Help needed climbing 3-5 steps with a railing? : A Little 6 Click Score: 20    End of Session   Activity Tolerance: Patient tolerated treatment well Patient left: in bed;with call bell/phone within reach;with bed alarm set Nurse Communication: Mobility status PT Visit Diagnosis: Difficulty in walking, not elsewhere classified (R26.2)    Time: 1237-1300 PT Time Calculation (min) (ACUTE ONLY): 23 min   Charges:   PT Evaluation $PT Eval Low Complexity: 1 Low PT Treatments $Gait Training: 8-22 mins        Blanchard Kelch PT Acute Rehabilitation Services Pager [phone] Office [phone]   Rada Hay 06/02/2020, 1:28 PM

## 2020-06-03 LAB — TYPE AND SCREEN
ABO/RH(D): B NEG
Antibody Screen: NEGATIVE
Unit division: 0
Unit division: 0
Unit division: 0

## 2020-06-03 LAB — H PYLORI, IGM, IGG, IGA AB
H Pylori IgG: >9.4 Index Value — ABNORMAL HIGH (ref 0.00–0.79)
H. Pylogi, Iga Abs: 27.2 units — ABNORMAL HIGH (ref 0.0–8.9)
H. Pylogi, Igm Abs: <9 units (ref 0.0–8.9)

## 2020-06-03 LAB — BPAM RBC
Blood Product Expiration Date: 202202142359
Blood Product Expiration Date: 202202212359
Blood Product Expiration Date: 202202242359
ISSUE DATE / TIME: 202201232148
ISSUE DATE / TIME: 202201240118
Unit Type and Rh: 1700
Unit Type and Rh: 1700
Unit Type and Rh: 1700

## 2020-06-19 ENCOUNTER — Other Ambulatory Visit: Payer: Self-pay

## 2020-06-19 ENCOUNTER — Inpatient Hospital Stay (HOSPITAL_COMMUNITY)
Admission: EM | Admit: 2020-06-19 | Discharge: 2020-06-28 | DRG: 871 | Disposition: A | Discharge status: Skilled Nursing Facility | Payer: Medicare Other | Attending: Family Medicine | Admitting: Family Medicine

## 2020-06-19 ENCOUNTER — Encounter (HOSPITAL_COMMUNITY): Payer: Self-pay | Admitting: Emergency Medicine

## 2020-06-19 ENCOUNTER — Emergency Department (HOSPITAL_COMMUNITY): Payer: Medicare Other

## 2020-06-19 DIAGNOSIS — R404 Transient alteration of awareness: Secondary | ICD-10-CM | POA: Diagnosis not present

## 2020-06-19 DIAGNOSIS — R55 Syncope and collapse: Secondary | ICD-10-CM | POA: Diagnosis not present

## 2020-06-19 DIAGNOSIS — Z515 Encounter for palliative care: Secondary | ICD-10-CM | POA: Diagnosis not present

## 2020-06-19 DIAGNOSIS — D649 Anemia, unspecified: Secondary | ICD-10-CM | POA: Diagnosis not present

## 2020-06-19 DIAGNOSIS — A419 Sepsis, unspecified organism: Secondary | ICD-10-CM | POA: Diagnosis not present

## 2020-06-19 DIAGNOSIS — B952 Enterococcus as the cause of diseases classified elsewhere: Secondary | ICD-10-CM | POA: Diagnosis not present

## 2020-06-19 DIAGNOSIS — K264 Chronic or unspecified duodenal ulcer with hemorrhage: Secondary | ICD-10-CM | POA: Diagnosis present

## 2020-06-19 DIAGNOSIS — I959 Hypotension, unspecified: Secondary | ICD-10-CM | POA: Diagnosis not present

## 2020-06-19 DIAGNOSIS — K921 Melena: Secondary | ICD-10-CM | POA: Diagnosis not present

## 2020-06-19 DIAGNOSIS — Z743 Need for continuous supervision: Secondary | ICD-10-CM | POA: Diagnosis not present

## 2020-06-19 DIAGNOSIS — Z66 Do not resuscitate: Secondary | ICD-10-CM | POA: Diagnosis present

## 2020-06-19 DIAGNOSIS — K219 Gastro-esophageal reflux disease without esophagitis: Secondary | ICD-10-CM | POA: Diagnosis present

## 2020-06-19 DIAGNOSIS — R630 Anorexia: Secondary | ICD-10-CM | POA: Diagnosis present

## 2020-06-19 DIAGNOSIS — Z20822 Contact with and (suspected) exposure to covid-19: Secondary | ICD-10-CM | POA: Diagnosis not present

## 2020-06-19 DIAGNOSIS — R109 Unspecified abdominal pain: Secondary | ICD-10-CM | POA: Diagnosis not present

## 2020-06-19 DIAGNOSIS — R627 Adult failure to thrive: Secondary | ICD-10-CM | POA: Diagnosis present

## 2020-06-19 DIAGNOSIS — Z6825 Body mass index (BMI) 25.0-25.9, adult: Secondary | ICD-10-CM | POA: Diagnosis not present

## 2020-06-19 DIAGNOSIS — E871 Hypo-osmolality and hyponatremia: Secondary | ICD-10-CM | POA: Diagnosis not present

## 2020-06-19 DIAGNOSIS — D500 Iron deficiency anemia secondary to blood loss (chronic): Secondary | ICD-10-CM | POA: Diagnosis not present

## 2020-06-19 DIAGNOSIS — Z79899 Other long term (current) drug therapy: Secondary | ICD-10-CM

## 2020-06-19 DIAGNOSIS — K922 Gastrointestinal hemorrhage, unspecified: Secondary | ICD-10-CM

## 2020-06-19 DIAGNOSIS — R52 Pain, unspecified: Secondary | ICD-10-CM | POA: Diagnosis not present

## 2020-06-19 DIAGNOSIS — R531 Weakness: Secondary | ICD-10-CM

## 2020-06-19 DIAGNOSIS — K2941 Chronic atrophic gastritis with bleeding: Secondary | ICD-10-CM | POA: Diagnosis not present

## 2020-06-19 DIAGNOSIS — I10 Essential (primary) hypertension: Secondary | ICD-10-CM | POA: Diagnosis not present

## 2020-06-19 DIAGNOSIS — I129 Hypertensive chronic kidney disease with stage 1 through stage 4 chronic kidney disease, or unspecified chronic kidney disease: Secondary | ICD-10-CM | POA: Diagnosis not present

## 2020-06-19 DIAGNOSIS — D62 Acute posthemorrhagic anemia: Secondary | ICD-10-CM | POA: Diagnosis present

## 2020-06-19 DIAGNOSIS — R6520 Severe sepsis without septic shock: Secondary | ICD-10-CM | POA: Diagnosis not present

## 2020-06-19 DIAGNOSIS — K5750 Diverticulosis of both small and large intestine without perforation or abscess without bleeding: Secondary | ICD-10-CM | POA: Diagnosis present

## 2020-06-19 DIAGNOSIS — K449 Diaphragmatic hernia without obstruction or gangrene: Secondary | ICD-10-CM | POA: Diagnosis present

## 2020-06-19 DIAGNOSIS — Z7401 Bed confinement status: Secondary | ICD-10-CM | POA: Diagnosis not present

## 2020-06-19 DIAGNOSIS — L89151 Pressure ulcer of sacral region, stage 1: Secondary | ICD-10-CM | POA: Diagnosis present

## 2020-06-19 DIAGNOSIS — M4854XA Collapsed vertebra, not elsewhere classified, thoracic region, initial encounter for fracture: Secondary | ICD-10-CM | POA: Diagnosis not present

## 2020-06-19 DIAGNOSIS — L8990 Pressure ulcer of unspecified site, unspecified stage: Secondary | ICD-10-CM | POA: Insufficient documentation

## 2020-06-19 DIAGNOSIS — I248 Other forms of acute ischemic heart disease: Secondary | ICD-10-CM | POA: Diagnosis not present

## 2020-06-19 DIAGNOSIS — G9341 Metabolic encephalopathy: Secondary | ICD-10-CM | POA: Diagnosis present

## 2020-06-19 DIAGNOSIS — N390 Urinary tract infection, site not specified: Secondary | ICD-10-CM | POA: Diagnosis not present

## 2020-06-19 DIAGNOSIS — K8689 Other specified diseases of pancreas: Secondary | ICD-10-CM | POA: Diagnosis not present

## 2020-06-19 DIAGNOSIS — G9340 Encephalopathy, unspecified: Secondary | ICD-10-CM | POA: Diagnosis not present

## 2020-06-19 DIAGNOSIS — M6281 Muscle weakness (generalized): Secondary | ICD-10-CM | POA: Diagnosis not present

## 2020-06-19 DIAGNOSIS — R1312 Dysphagia, oropharyngeal phase: Secondary | ICD-10-CM | POA: Diagnosis not present

## 2020-06-19 DIAGNOSIS — K2950 Unspecified chronic gastritis without bleeding: Secondary | ICD-10-CM | POA: Diagnosis not present

## 2020-06-19 DIAGNOSIS — E876 Hypokalemia: Secondary | ICD-10-CM | POA: Diagnosis present

## 2020-06-19 DIAGNOSIS — N179 Acute kidney failure, unspecified: Secondary | ICD-10-CM | POA: Diagnosis not present

## 2020-06-19 DIAGNOSIS — R58 Hemorrhage, not elsewhere classified: Secondary | ICD-10-CM | POA: Diagnosis not present

## 2020-06-19 DIAGNOSIS — Z7189 Other specified counseling: Secondary | ICD-10-CM | POA: Diagnosis not present

## 2020-06-19 DIAGNOSIS — I878 Other specified disorders of veins: Secondary | ICD-10-CM | POA: Diagnosis not present

## 2020-06-19 DIAGNOSIS — R2681 Unsteadiness on feet: Secondary | ICD-10-CM | POA: Diagnosis not present

## 2020-06-19 DIAGNOSIS — K269 Duodenal ulcer, unspecified as acute or chronic, without hemorrhage or perforation: Secondary | ICD-10-CM | POA: Diagnosis not present

## 2020-06-19 DIAGNOSIS — K314 Gastric diverticulum: Secondary | ICD-10-CM | POA: Diagnosis not present

## 2020-06-19 DIAGNOSIS — E1165 Type 2 diabetes mellitus with hyperglycemia: Secondary | ICD-10-CM | POA: Diagnosis not present

## 2020-06-19 DIAGNOSIS — N1832 Chronic kidney disease, stage 3b: Secondary | ICD-10-CM | POA: Diagnosis present

## 2020-06-19 DIAGNOSIS — R000 Tachycardia, unspecified: Secondary | ICD-10-CM | POA: Diagnosis not present

## 2020-06-19 DIAGNOSIS — K625 Hemorrhage of anus and rectum: Secondary | ICD-10-CM | POA: Diagnosis not present

## 2020-06-19 DIAGNOSIS — R1013 Epigastric pain: Secondary | ICD-10-CM | POA: Diagnosis not present

## 2020-06-19 DIAGNOSIS — M2550 Pain in unspecified joint: Secondary | ICD-10-CM | POA: Diagnosis not present

## 2020-06-19 LAB — HEMOGLOBIN AND HEMATOCRIT, BLOOD
HCT: 13 % — ABNORMAL LOW (ref 36.0–46.0)
HCT: 12.9 % — ABNORMAL LOW (ref 36.0–46.0)
Hemoglobin: 4.3 g/dL — CL (ref 12.0–15.0)
Hemoglobin: 3.9 g/dL — CL (ref 12.0–15.0)

## 2020-06-19 LAB — URINALYSIS, ROUTINE W REFLEX MICROSCOPIC
Bilirubin Urine: NEGATIVE
Glucose, UA: NEGATIVE mg/dL
Ketones, ur: NEGATIVE mg/dL
Leukocytes,Ua: NEGATIVE
Nitrite: NEGATIVE
Protein, ur: NEGATIVE mg/dL
Specific Gravity, Urine: 1.02 (ref 1.005–1.030)
pH: 5 (ref 5.0–8.0)

## 2020-06-19 LAB — CBC WITH DIFFERENTIAL/PLATELET
Abs Immature Granulocytes: 0.28 10*3/uL — ABNORMAL HIGH (ref 0.00–0.07)
Basophils Absolute: 0 10*3/uL (ref 0.0–0.1)
Basophils Relative: 0 %
Eosinophils Absolute: 0 10*3/uL (ref 0.0–0.5)
Eosinophils Relative: 0 %
HCT: 19.8 % — ABNORMAL LOW (ref 36.0–46.0)
Hemoglobin: 6.3 g/dL — CL (ref 12.0–15.0)
Immature Granulocytes: 1 %
Lymphocytes Relative: 13 %
Lymphs Abs: 2.5 10*3/uL (ref 0.7–4.0)
MCH: 28.9 pg (ref 26.0–34.0)
MCHC: 31.8 g/dL (ref 30.0–36.0)
MCV: 90.8 fL (ref 80.0–100.0)
Monocytes Absolute: 1 10*3/uL (ref 0.1–1.0)
Monocytes Relative: 5 %
Neutro Abs: 15.8 10*3/uL — ABNORMAL HIGH (ref 1.7–7.7)
Neutrophils Relative %: 81 %
Platelets: 328 10*3/uL (ref 150–400)
RBC: 2.18 MIL/uL — ABNORMAL LOW (ref 3.87–5.11)
RDW: 20.9 % — ABNORMAL HIGH (ref 11.5–15.5)
WBC: 19.6 10*3/uL — ABNORMAL HIGH (ref 4.0–10.5)
nRBC: 0 % (ref 0.0–0.2)

## 2020-06-19 LAB — COMPREHENSIVE METABOLIC PANEL
ALT: 15 U/L (ref 0–44)
AST: 21 U/L (ref 15–41)
Albumin: 3.7 g/dL (ref 3.5–5.0)
Alkaline Phosphatase: 47 U/L (ref 38–126)
Anion gap: 15 (ref 5–15)
BUN: 95 mg/dL — ABNORMAL HIGH (ref 8–23)
CO2: 15 mmol/L — ABNORMAL LOW (ref 22–32)
Calcium: 9 mg/dL (ref 8.9–10.3)
Chloride: 105 mmol/L (ref 98–111)
Creatinine, Ser: 2.12 mg/dL — ABNORMAL HIGH (ref 0.44–1.00)
GFR, Estimated: 21 mL/min — ABNORMAL LOW (ref >60)
Glucose, Bld: 199 mg/dL — ABNORMAL HIGH (ref 70–99)
Potassium: 4.5 mmol/L (ref 3.5–5.1)
Sodium: 135 mmol/L (ref 135–145)
Total Bilirubin: 0.7 mg/dL (ref 0.3–1.2)
Total Protein: 6.5 g/dL (ref 6.5–8.1)

## 2020-06-19 LAB — PREPARE RBC (CROSSMATCH)

## 2020-06-19 LAB — LACTIC ACID, PLASMA
Lactic Acid, Venous: 5.1 mmol/L (ref 0.5–1.9)
Lactic Acid, Venous: 4 mmol/L (ref 0.5–1.9)

## 2020-06-19 LAB — RESP PANEL BY RT-PCR (FLU A&B, COVID) ARPGX2
Influenza A by PCR: NEGATIVE
Influenza B by PCR: NEGATIVE
SARS Coronavirus 2 by RT PCR: NEGATIVE

## 2020-06-19 LAB — PROTIME-INR
INR: 1.2 (ref 0.8–1.2)
Prothrombin Time: 15 seconds (ref 11.4–15.2)

## 2020-06-19 LAB — CBG MONITORING, ED: Glucose-Capillary: 146 mg/dL — ABNORMAL HIGH (ref 70–99)

## 2020-06-19 LAB — BRAIN NATRIURETIC PEPTIDE: B Natriuretic Peptide: 57.1 pg/mL (ref 0.0–100.0)

## 2020-06-19 LAB — POC OCCULT BLOOD, ED: Fecal Occult Bld: POSITIVE — AB

## 2020-06-19 LAB — TROPONIN I (HIGH SENSITIVITY)
Troponin I (High Sensitivity): 40 ng/L — ABNORMAL HIGH (ref <18)
Troponin I (High Sensitivity): 40 ng/L — ABNORMAL HIGH (ref <18)

## 2020-06-19 LAB — MAGNESIUM: Magnesium: 2.2 mg/dL (ref 1.7–2.4)

## 2020-06-19 LAB — APTT: aPTT: <20 seconds — ABNORMAL LOW (ref 24–36)

## 2020-06-19 MED ORDER — SODIUM CHLORIDE 0.9 % IV SOLN
2.0000 g | INTRAVENOUS | Status: DC
  Administered 2020-06-20 – 2020-06-22 (×3): 2 g via INTRAVENOUS
  Filled 2020-06-19 (×3): qty 2

## 2020-06-19 MED ORDER — SODIUM CHLORIDE 0.9 % IV SOLN: 2.0000 g | Freq: Once | INTRAVENOUS | Status: DC

## 2020-06-19 MED ORDER — LACTATED RINGERS IV SOLN: INTRAVENOUS | Status: DC

## 2020-06-19 MED ORDER — LACTATED RINGERS IV BOLUS (SEPSIS)
500.0000 mL | Freq: Once | INTRAVENOUS | Status: AC
  Administered 2020-06-19: 500 mL via INTRAVENOUS

## 2020-06-19 MED ORDER — METRONIDAZOLE IN NACL 5-0.79 MG/ML-% IV SOLN
500.0000 mg | Freq: Once | INTRAVENOUS | Status: AC
  Administered 2020-06-19: 500 mg via INTRAVENOUS
  Filled 2020-06-19: qty 100

## 2020-06-19 MED ORDER — ONDANSETRON HCL 4 MG/2ML IJ SOLN: 4.0000 mg | Freq: Four times a day (QID) | INTRAMUSCULAR | Status: DC | PRN

## 2020-06-19 MED ORDER — PANTOPRAZOLE SODIUM 40 MG IV SOLR
40.0000 mg | Freq: Two times a day (BID) | INTRAVENOUS | Status: DC
  Administered 2020-06-19 – 2020-06-25 (×13): 40 mg via INTRAVENOUS
  Filled 2020-06-19 (×14): qty 40

## 2020-06-19 MED ORDER — SODIUM CHLORIDE 0.9% FLUSH
3.0000 mL | Freq: Two times a day (BID) | INTRAVENOUS | Status: DC
  Administered 2020-06-19 – 2020-06-28 (×15): 3 mL via INTRAVENOUS

## 2020-06-19 MED ORDER — SODIUM CHLORIDE 0.9 % IV SOLN: INTRAVENOUS | Status: AC

## 2020-06-19 MED ORDER — SODIUM CHLORIDE 0.9 % IV SOLN
2.0000 g | Freq: Once | INTRAVENOUS | Status: AC
  Administered 2020-06-19: 2 g via INTRAVENOUS
  Filled 2020-06-19: qty 2

## 2020-06-19 MED ORDER — LACTATED RINGERS IV BOLUS (SEPSIS)
1000.0000 mL | Freq: Once | INTRAVENOUS | Status: AC
  Administered 2020-06-19: 1000 mL via INTRAVENOUS

## 2020-06-19 MED ORDER — SODIUM CHLORIDE 0.9 % IV SOLN
10.0000 mL/h | Freq: Once | INTRAVENOUS | Status: AC
  Administered 2020-06-19: 10 mL/h via INTRAVENOUS

## 2020-06-19 MED ORDER — ONDANSETRON HCL 4 MG PO TABS: 4.0000 mg | ORAL_TABLET | Freq: Four times a day (QID) | ORAL | Status: DC | PRN

## 2020-06-19 MED ORDER — METRONIDAZOLE IN NACL 5-0.79 MG/ML-% IV SOLN
500.0000 mg | Freq: Three times a day (TID) | INTRAVENOUS | Status: DC
  Administered 2020-06-19 – 2020-06-22 (×8): 500 mg via INTRAVENOUS
  Filled 2020-06-19 (×8): qty 100

## 2020-06-19 NOTE — ED Notes (Signed)
Critical lab value of lactic: 5.1 received; Ray, MD notified

## 2020-06-19 NOTE — ED Notes (Signed)
Date and time results received: 06/19/20 2:01 PM   Test: Hemoglobin Critical Value: 4.3  Name of Provider Notified: Dairl Ponder MD  Orders Received? Or Actions Taken?: none at this time

## 2020-06-19 NOTE — ED Triage Notes (Signed)
BIBA Per EMS: Pt complaining of weakness, slow decline since D/C 2 wks ago; black tarry bowel movement this morning  A&O x4  100% RA 277 CBG  97.2 temp  80/70 BP 118HR

## 2020-06-19 NOTE — ED Notes (Addendum)
Urinalysis was just collected. I did not change the time when the task was clicked off so it states it was sent at 9:29 am which is incorrect.

## 2020-06-19 NOTE — ED Provider Notes (Signed)
[facility]-EMERGENCY DEPT Provider Note   CSN: [id number] Arrival date & time: 06/19/20  2993     History Chief Complaint  Patient presents with  . Weakness    Elizabeth Jarvis is a 87 y.o. female who presents from home via EMS due to progressive weakness, anorexia, and altered mental status over the last few days.  Patient was recently admitted to the hospital for symptomatic anemia secondary to GI bleed.  She did not undergo endoscopic evaluation at that time given her age and preference to avoid these procedures.  Patient was found to have diverticulosis at that time, however did not have diverticulitis.  Her son endorses black tarry stools at home since her discharge.  She was fecal occult positive at her admission.  Per EMS report patient was hypotensive with some with SBP in the 80s, as well as tachycardic.  She was afebrile at that time maintaining her oxygen saturation on room air.  LEVEL 5 CAVEAT due to patient's altered mental status.  I personally reviewed the patient's medical records in addition to her recent admission to the hospital, she has history of hypertension and GERD.  Additionally son would like it to be noted in patient's chart that she speaks [place] in addition to Hindi.  HPI     Past Medical History:  Diagnosis Date  . GERD (gastroesophageal reflux disease)   . Hypertension     Patient Active Problem List   Diagnosis Date Noted  . Severe sepsis (HCC) 06/19/2020  . Acute blood loss anemia 06/19/2020  . Acute metabolic encephalopathy 06/19/2020  . Gastrointestinal hemorrhage   . Anemia 05/30/2020  . Failure to thrive in adult 05/30/2020  . Acute lower UTI 01/16/2014  . Acute kidney injury (HCC) 01/13/2014  . HTN (hypertension) 01/13/2014  . Leukocytosis, unspecified 01/13/2014  . Unspecified constipation 01/13/2014  . Decubitus ulcer limited to breakdown of skin (stage 2) (HCC) 01/13/2014  . Obesity (BMI 30-39.9) 01/13/2014  .  Acute renal failure (HCC) 01/07/2014  . Hyponatremia 01/07/2014  . Edema of both legs 01/07/2014  . Weakness generalized 01/07/2014  . Urinary retention 01/07/2014  . Abdominal pain 01/07/2014    History reviewed. No pertinent surgical history.   OB History   No obstetric history on file.     History reviewed. No pertinent family history.  Social History   Tobacco Use  . Smoking status: Never Smoker  . Smokeless tobacco: Never Used  Substance Use Topics  . Alcohol use: No  . Drug use: No    Home Medications Prior to Admission medications   Medication Sig Start Date End Date Taking? Authorizing Provider  b complex vitamins tablet Take 1 tablet by mouth daily.   Yes [provider]  calcium-vitamin D (OSCAL WITH D) 500-200 MG-UNIT per tablet Take 1 tablet by mouth daily with breakfast.   Yes [provider]  donepezil (ARICEPT) 5 MG tablet Take 5 mg by mouth daily. 06/11/20  Yes [provider]  Magnesium Hydroxide (DULCOLAX PO) Take 1 tablet by mouth daily as needed (constipation).   Yes [provider]  megestrol (MEGACE) 40 MG/ML suspension Take 400 mg by mouth 2 (two) times daily. 06/11/20  Yes [provider]  nystatin (MYCOSTATIN) 100000 UNIT/ML suspension Take 5 mLs by mouth 3 (three) times daily. 06/08/20  Yes [provider]  ondansetron (ZOFRAN) 4 MG tablet Take 1 tablet (4 mg total) by mouth every 6 (six) hours as needed for nausea. 06/02/20  Yes Jerolyn Center,  Gardiner Ramus, MD  pantoprazole (PROTONIX) 40 MG tablet Take 1 tablet (40 mg total) by mouth 2 (two) times daily. 06/02/20  Yes Alwyn Ren, MD  polyethylene glycol (MIRALAX) 17 g packet Take 17 g by mouth 2 (two) times daily. 06/02/20  Yes Alwyn Ren, MD  Polyethylene Glycol 400 (BLINK TEARS OP) Place 1 drop into both eyes 3 (three) times daily.   Yes [provider]  psyllium (HYDROCIL/METAMUCIL) 95 % PACK Take 1 packet by mouth 2 (two) times  daily. 06/02/20  Yes Alwyn Ren, MD    Allergies    Patient has no known allergies.  Review of Systems   Review of Systems  Unable to perform ROS: Mental status change    Physical Exam Updated Vital Signs BP 92/75   Pulse (!) 101   Temp (!) 97.2 F (36.2 C) (Rectal)   Resp (!) 21   Ht 5' (1.524 m)   Wt 59 kg   SpO2 100%   BMI 25.39 kg/m   Physical Exam Vitals and nursing note reviewed.  Constitutional:      Appearance: She is cachectic. She is ill-appearing.  HENT:     Head: Normocephalic and atraumatic.     Nose: Nose normal.     Mouth/Throat:     Mouth: Mucous membranes are pale and dry.     Pharynx: Oropharynx is clear. Uvula midline. No oropharyngeal exudate or posterior oropharyngeal erythema.  Eyes:     General: Lids are normal. Vision grossly intact.        Right eye: No discharge.        Left eye: No discharge.     Extraocular Movements: Extraocular movements intact.     Conjunctiva/sclera: Conjunctivae normal.     Pupils: Pupils are equal, round, and reactive to light.  Neck:     Trachea: Trachea normal.  Cardiovascular:     Rate and Rhythm: Regular rhythm. Tachycardia present.     Pulses: Normal pulses.          Radial pulses are 2+ on the right side and 2+ on the left side.       Dorsalis pedis pulses are 2+ on the right side and 2+ on the left side.     Heart sounds: No murmur heard.   Pulmonary:     Effort: Pulmonary effort is normal. No prolonged expiration or respiratory distress.     Breath sounds: Normal breath sounds. No wheezing or rales.  Chest:     Chest wall: No mass, lacerations, deformity, swelling or tenderness.  Abdominal:     General: Bowel sounds are normal. There is no distension.     Palpations: Abdomen is soft.     Tenderness: There is generalized abdominal tenderness. There is no right CVA tenderness, left CVA tenderness, guarding or rebound.  Musculoskeletal:        General: No deformity.     Cervical back: Full  passive range of motion without pain, normal range of motion and neck supple. No tenderness or crepitus. No pain with movement, spinous process tenderness or muscular tenderness.     Right lower leg: 3+ Pitting Edema present.     Left lower leg: 3+ Pitting Edema present.  Lymphadenopathy:     Cervical: No cervical adenopathy.  Skin:    General: Skin is warm and dry.     Capillary Refill: Capillary refill takes less than 2 seconds.  Neurological:     Mental Status: She is lethargic.  Sensory: Sensation is intact.     Motor: Motor function is intact.  Psychiatric:        Mood and Affect: Mood normal.     ED Results / Procedures / Treatments   Labs (all labs ordered are listed, but only abnormal results are displayed) Labs Reviewed  LACTIC ACID, PLASMA - Abnormal; Notable for the following components:      Result Value   Lactic Acid, Venous 5.1 (*)    All other components within normal limits  LACTIC ACID, PLASMA - Abnormal; Notable for the following components:   Lactic Acid, Venous 4.0 (*)    All other components within normal limits  COMPREHENSIVE METABOLIC PANEL - Abnormal; Notable for the following components:   CO2 15 (*)    Glucose, Bld 199 (*)    BUN 95 (*)    Creatinine, Ser 2.12 (*)    GFR, Estimated 21 (*)    All other components within normal limits  CBC WITH DIFFERENTIAL/PLATELET - Abnormal; Notable for the following components:   WBC 19.6 (*)    RBC 2.18 (*)    Hemoglobin 6.3 (*)    HCT 19.8 (*)    RDW 20.9 (*)    Neutro Abs 15.8 (*)    Abs Immature Granulocytes 0.28 (*)    All other components within normal limits  APTT - Abnormal; Notable for the following components:   aPTT <20 (*)    All other components within normal limits  HEMOGLOBIN AND HEMATOCRIT, BLOOD - Abnormal; Notable for the following components:   Hemoglobin 4.3 (*)    HCT 13.0 (*)    All other components within normal limits  CBG MONITORING, ED - Abnormal; Notable for the following  components:   Glucose-Capillary 146 (*)    All other components within normal limits  POC OCCULT BLOOD, ED - Abnormal; Notable for the following components:   Fecal Occult Bld POSITIVE (*)    All other components within normal limits  TROPONIN I (HIGH SENSITIVITY) - Abnormal; Notable for the following components:   Troponin I (High Sensitivity) 40 (*)    All other components within normal limits  TROPONIN I (HIGH SENSITIVITY) - Abnormal; Notable for the following components:   Troponin I (High Sensitivity) 40 (*)    All other components within normal limits  RESP PANEL BY RT-PCR (FLU A&B, COVID) ARPGX2  URINE CULTURE  CULTURE, BLOOD (ROUTINE X 2)  CULTURE, BLOOD (ROUTINE X 2)  PROTIME-INR  BRAIN NATRIURETIC PEPTIDE  MAGNESIUM  URINALYSIS, ROUTINE W REFLEX MICROSCOPIC  HEMOGLOBIN AND HEMATOCRIT, BLOOD  TYPE AND SCREEN  PREPARE RBC (CROSSMATCH)    EKG EKG: sinus tachycardia, abnormal Rwave progression, no STEMI.   Radiology CT ABDOMEN PELVIS WO CONTRAST  Result Date: 06/19/2020 CLINICAL DATA:  87 year old female with abdominal pain. Known large bowel diverticula. EXAM: CT ABDOMEN AND PELVIS WITHOUT CONTRAST TECHNIQUE: Multidetector CT imaging of the abdomen and pelvis was performed following the standard protocol without IV contrast. COMPARISON:  CT Abdomen and Pelvis with contrast 05/30/2020. FINDINGS: Lower chest: Stable lung bases. Calcified coronary artery atherosclerosis. No cardiomegaly, pericardial or pleural effusion. Hepatobiliary: Diminutive or absent gallbladder as before. Bile ducts appear stable. Pancreas: Stable pancreatic atrophy. Spleen: Stable, diminutive. Adrenals/Urinary Tract: Negative adrenal glands. Diminutive but otherwise negative noncontrast kidneys. No nephrolithiasis or hydronephrosis. Decompressed proximal ureters. Diminutive and negative urinary bladder. Incidental pelvic phleboliths. Stomach/Bowel: Gas and low-density stool in the rectum. Abundant retained  stool in the sigmoid colon with diverticulosis redemonstrated. No active inflammation.  Decompressed left colon, transverse colon. Redundant but otherwise negative right colon. Negative terminal ileum. No pericecal inflammation. Appendix not identified and might be diminutive or absent. No dilated small bowel. Increased gas in the stomach which otherwise appears negative. Second portion duodenum diverticulum redemonstrated with no adverse features. No free air, free fluid, mesenteric stranding. Vascular/Lymphatic: Normal caliber abdominal aorta. Aortoiliac calcified atherosclerosis. Hypodensity within the major vascular lumen suggests anemia. No lymphadenopathy. Reproductive: Negative noncontrast appearance. Other: No pelvic free fluid. Musculoskeletal: Osteopenia. Motion artifact in the lower chest. Chronic T12 through L3 compression fractures appear stable. Advanced disc and facet degeneration at the lumbosacral junction. Chronic right inferior pubic ramus deformity. No acute osseous abnormality identified. IMPRESSION: 1. No acute or inflammatory process identified in the non-contrast abdomen or pelvis. 2. Abundant retained stool in the rectosigmoid colon. Sigmoid diverticulosis without active inflammation. 3. Vessel density suspicious for anemia. Calcified coronary artery and Aortic Atherosclerosis (ICD10-I70.0). Electronically Signed   By: Odessa FlemingH  Hall M.D.   On: 06/19/2020 11:27   DG Chest Port 1 View  Result Date: 06/19/2020 CLINICAL DATA:  Weakness EXAM: PORTABLE CHEST 1 VIEW COMPARISON:  None. FINDINGS: Lungs are clear. Heart size and pulmonary vascularity are normal. No adenopathy. There is aortic atherosclerosis. No bone lesions. IMPRESSION: Lungs clear. Heart size normal. Aortic Atherosclerosis (ICD10-I70.0). Electronically Signed   By: Bretta BangWilliam  Woodruff III M.D.   On: 06/19/2020 10:22    Procedures .Critical Care Performed by: Paris LoreSponseller, Audianna Landgren R, PA-C Authorized by: Paris LoreSponseller, Ossie Yebra R, PA-C    Critical care provider statement:    Critical care time (minutes):  45   Critical care was necessary to treat or prevent imminent or life-threatening deterioration of the following conditions:  Sepsis   Critical care was time spent personally by me on the following activities:  Discussions with consultants, evaluation of patient's response to treatment, examination of patient, ordering and performing treatments and interventions, ordering and review of laboratory studies, ordering and review of radiographic studies, pulse oximetry, re-evaluation of patient's condition, obtaining history from patient or surrogate and review of old charts   Care discussed with: admitting provider       Medications Ordered in ED Medications  sodium chloride flush (NS) 0.9 % injection 3 mL (3 mLs Intravenous Given 06/19/20 1408)  metroNIDAZOLE (FLAGYL) IVPB 500 mg (500 mg Intravenous Not Given 06/19/20 1424)  ondansetron (ZOFRAN) tablet 4 mg (has no administration in time range)    Or  ondansetron (ZOFRAN) injection 4 mg (has no administration in time range)  pantoprazole (PROTONIX) injection 40 mg (40 mg Intravenous Given 06/19/20 1441)  ceFEPIme (MAXIPIME) 2 g in sodium chloride 0.9 % 100 mL IVPB (has no administration in time range)  0.9 %  sodium chloride infusion ( Intravenous New Bag/Given (Non-Interop) 06/19/20 1407)  0.9 %  sodium chloride infusion (0 mL/hr Intravenous Stopped 06/19/20 1408)  lactated ringers bolus 1,000 mL (0 mLs Intravenous Stopped 06/19/20 1321)    And  lactated ringers bolus 500 mL (0 mLs Intravenous Stopped 06/19/20 1311)  ceFEPIme (MAXIPIME) 2 g in sodium chloride 0.9 % 100 mL IVPB (0 g Intravenous Stopped 06/19/20 1311)  metroNIDAZOLE (FLAGYL) IVPB 500 mg (0 mg Intravenous Stopped 06/19/20 1321)    ED Course  I have reviewed the triage vital signs and the nursing notes.  Pertinent labs & imaging results that were available during my care of the patient were reviewed by me and  considered in my medical decision making (see chart for details).  Clinical Course as of  06/19/20 1519  Sat Jun 19, 2020  0941 I personally spoke on the phone with this patient's son, Cohen Doleman who provided collateral information. He states patient's energy has been dwindling since her discharge from hospital approximately 2 weeks ago.  Additionally states that she has been eating less and less since she has been home.  He states of the last 2 days she has had only 1 cup of tea and a few bites of food.  He states that she says she is not interested in eating.  Additionally at her baseline she urinates every 2-3 hours, however she has only urinated twice in the last 24 hours, one of the time of which was in her bed this morning.  He states that she has been less responsive, has not been speaking for the last 12 hours, and has been trembling.  Additionally he states that her legs are more swollen than they are at her baseline.  He confirmed that the patient's wishes are to be DNR.  [RS]  1000 Critical result called, lactic acid 5.1, hemoglobin 6.3.  Risk and benefit of blood transfusion discussed with patient's son who is at the bedside.  He voiced understanding of this procedure and expressed wishes to proceed with blood transfusion for his mother at this time.  PRBCs ordered.  [RS]  1015 Rectal temperature 95.7 F, patient placed on Bair hugger. [RS]  1152 Consult placed to hospitalist, Dr. Dairl Ponder, who agreed to evaluate this patient in the emergency department and admit her to his service.  I appreciate his collaboration in the care of this patient. [RS]    Clinical Course User Index [RS] Seira Cody, Eugene Gavia, PA-C   MDM Rules/Calculators/A&P                         87 year old female who presents for progressive weakness and altered mental status over the past 2 weeks since her discharge from the hospital for symptomatic anemia due to GI bleed. PATIENT IS DNR; according to her family, these are  her wishes.  Patient is tachycardic and hypotensive on intake (104/41).  Nursing staff unable to obtain oral temperature; will obtain rectal temp.  Patient is maintaining her oxygen saturation on room air.  Cardiac exam revealed persistent tachycardia, pulmonary exam is unremarkable.  Abdominal exam revealed soft nondistended abdomen, however patient winces with palpation of entire abdomen.  Lower extremities are significantly edematous with grade 3 pitting edema.  Patient is tender to palpation of her lower extremities, there is normal symmetric DP pulses bilaterally.  Patient appears very pale, lethargic, minimally responsive. Per collateral hx from son, patient is fluent in [place] and very hard of hearing. She will occasionally look in my direction after I speak loudly in her ear, however does not verbalize anything when speaking with me.   Patient critically anemic, with lactic acid significant elevated to 5.1, additionally there is leukocytosis of 19,000 and that is new for this patient from prior admission 2 weeks ago.  Patient she has AKI with jump in creatinine to 2.1, previously 1.1.  Chest x-ray negative for acute cardiopulmonary disease.  EKG with sinus tachycardia without STEMI.  COVID-19 test negative.  At this time will begin  broad-spectrum antibiotics for presumed intra-abdominal infection in this patient with GI bleed and diverticulosis.  Additionally we will proceed with septic order set fluid resuscitation, as well as red blood cell transfusion. CT AP pending.  Please see ED course as above for more  detail.  Feel patient would benefit from admission to the hospital this time given sepsis presentation and hemodynamic instability.  Family is amenable to plan for admission to the hospital this time.   Patient been admitted to hospitalist service.   This chart was dictated using voice recognition software, Dragon. Despite the best efforts of this provider to proofread and correct errors,  errors may still occur which can change documentation meaning.  Final Clinical Impression(s) / ED Diagnoses Final diagnoses:  None    Rx / DC Orders ED Discharge Orders    None       Sherrilee Gilles 06/19/20 1522    Margarita Grizzle, MD 06/22/20 1253

## 2020-06-19 NOTE — Progress Notes (Signed)
A consult was received from an ED physician for Cefepime per pharmacy dosing.  The patient's profile has been reviewed for ht/wt/allergies/indication/available labs.   A one time order has been placed for Cefepime 2g IV.  Further antibiotics/pharmacy consults should be ordered by admitting physician if indicated.                       Thank you,  Lynann Beaver PharmD, BCPS Clinical Pharmacist [facility] main pharmacy [phone] 06/19/2020 10:58 AM

## 2020-06-19 NOTE — Sepsis Progress Note (Signed)
elink is monitoring this code sepsis 

## 2020-06-19 NOTE — ED Notes (Signed)
Main lab phlebotomy in to collect cultures  Only able to obtain approx 33ml of blood for blue top culture- JI/VP @ 1036hrs

## 2020-06-19 NOTE — H&P (Addendum)
History and Physical        Hospital Admission Note Date: 06/19/2020  Patient name: Elizabeth Jarvis Medical record number: [id number] Date of birth: August 31, 1921 Age: 87 y.o. Gender: female  PCP: Pcp, No  Patient coming from: home Lives with: son   Chief Complaint    Chief Complaint  Patient presents with  . Weakness      HPI:   History obtained from son at bedside.  Per son patient is fluent in [place] but very hard of hearing  This is a 87 year old female with past medical history of hypertension, GERD, chronic lower extremity lymphedema who was recently hospitalized from 1/23-1/26 for GI bleed without intervention who presented to the ED today with progressive weakness, recurrent GI bleed, altered mental status and syncopal episode at home today.  At her recent hospitalization patient was transfused 2 units PRBCs and was evaluated by GI.  At the time, the son preferred to hold off on any endoscopic evaluation unless completely necessary and so she was managed medically and discharged with Protonix and recommended outpatient follow-up.  She has continued to to have failure to thrive symptoms and abdominal pain and early this morning she had a dark BM.  Patient had a syncopal episode at the same time while sitting that son had witnessed which only lasted a few seconds and she did not fall to the ground or suffer trauma.  Per EMS report the patient was hypotensive with SBP in 80s as well as tachycardic and afebrile on room air.  Son states she seems better than this morning but not back to normal.  States that only a few months ago she was relatively independent and has since had significant decline in her health.   ED Course: Hypothermia (95.7 F) tachycardia, soft BP 104/41. Notable Labs: Sodium 135, K4.5, CO2 15, glucose 199, BUN 95, creatinine 2.12, BNP 57, troponin 40, lactic acid  5.1, WBC 19.6, Hb 6.3 (down from 10.2 on 1/26), platelets 328, COVID-19 and flu negative, FOBT positive. Notable Imaging: CXR unremarkable.  CT abdomen pelvis without contrast-no acute or inflammatory process, abundant retained stool in the rectosigmoid colon without active inflammation, sigmoid diverticulosis, vessel density suspicious for anemia. Patient received metronidazole, cefepime, 1.5 L LR bolus with maintenance fluid.  2 units PRBCs ordered   Vitals:   06/19/20 1045 06/19/20 1150  BP: (!) 103/53 (!) 107/95  Pulse:  98  Resp: 17 18  Temp:    SpO2:  99%     Review of Systems:  Review of Systems  Unable to perform ROS: Mental status change    Medical/Social/Family History   Past Medical History: Past Medical History:  Diagnosis Date  . GERD (gastroesophageal reflux disease)   . Hypertension     History reviewed. No pertinent surgical history.  Medications: Prior to Admission medications   Medication Sig Start Date End Date Taking? Authorizing Provider  b complex vitamins tablet Take 1 tablet by mouth daily.    [provider]  calcium-vitamin D (OSCAL WITH D) 500-200 MG-UNIT per tablet Take 1 tablet by mouth daily with breakfast.    [provider]  Magnesium Hydroxide (DULCOLAX PO) Take 1 tablet by mouth daily as needed (constipation).  [provider]  ondansetron (ZOFRAN) 4 MG tablet Take 1 tablet (4 mg total) by mouth every 6 (six) hours as needed for nausea. 06/02/20   Alwyn RenMathews, Elizabeth G, MD  pantoprazole (PROTONIX) 40 MG tablet Take 1 tablet (40 mg total) by mouth 2 (two) times daily. 06/02/20   Alwyn RenMathews, Elizabeth G, MD  polyethylene glycol (MIRALAX) 17 g packet Take 17 g by mouth 2 (two) times daily. 06/02/20   Alwyn RenMathews, Elizabeth G, MD  Polyethylene Glycol 400 (BLINK TEARS OP) Place 1 drop into both eyes 3 (three) times daily.    [provider]  psyllium (HYDROCIL/METAMUCIL) 95 % PACK Take 1 packet by mouth 2 (two) times  daily. 06/02/20   Alwyn RenMathews, Elizabeth G, MD    Allergies:  No Known Allergies  Social History:  reports that she has never smoked. She has never used smokeless tobacco. She reports that she does not drink alcohol and does not use drugs.  Family History: History reviewed. No pertinent family history.   Objective   Physical Exam: Blood pressure (!) 107/95, pulse 98, temperature (!) 95.7 F (35.4 C), temperature source Rectal, resp. rate 18, height 5' (1.524 m), weight 59 kg, SpO2 99 %.  Physical Exam Vitals and nursing note reviewed. Exam conducted with a chaperone present.  Constitutional:      Comments: Frail elderly female Follows commands   HENT:     Head:     Comments: Temporal wasting Sunken eyes Eyes:     Conjunctiva/sclera: Conjunctivae normal.  Cardiovascular:     Rate and Rhythm: Regular rhythm. Tachycardia present.  Pulmonary:     Effort: Pulmonary effort is normal. No respiratory distress.  Abdominal:     General: Bowel sounds are decreased.     Palpations: Abdomen is soft.  Musculoskeletal:     Comments: Significant bilateral lower extremity nonpitting edema with tenderness to palpation, no erythema but positive ecchymosis  Skin:    Coloration: Skin is pale.  Neurological:     Comments: Does not answer questioning      LABS on Admission: I have personally reviewed all the labs and imaging below    Basic Metabolic Panel: Recent Labs  Lab 06/19/20 0929  NA 135  K 4.5  CL 105  CO2 15*  GLUCOSE 199*  BUN 95*  CREATININE 2.12*  CALCIUM 9.0   Liver Function Tests: Recent Labs  Lab 06/19/20 0929  AST 21  ALT 15  ALKPHOS 47  BILITOT 0.7  PROT 6.5  ALBUMIN 3.7   No results for input(s): LIPASE, AMYLASE in the last 168 hours. No results for input(s): AMMONIA in the last 168 hours. CBC: Recent Labs  Lab 06/19/20 0929  WBC 19.6*  NEUTROABS 15.8*  HGB 6.3*  HCT 19.8*  MCV 90.8  PLT 328   Cardiac Enzymes: No results for input(s):  CKTOTAL, CKMB, CKMBINDEX, TROPONINI in the last 168 hours. BNP: Invalid input(s): POCBNP CBG: Recent Labs  Lab 06/19/20 1006  GLUCAP 146*    Radiological Exams on Admission:  CT ABDOMEN PELVIS WO CONTRAST  Result Date: 06/19/2020 CLINICAL DATA:  87 year old female with abdominal pain. Known large bowel diverticula. EXAM: CT ABDOMEN AND PELVIS WITHOUT CONTRAST TECHNIQUE: Multidetector CT imaging of the abdomen and pelvis was performed following the standard protocol without IV contrast. COMPARISON:  CT Abdomen and Pelvis with contrast 05/30/2020. FINDINGS: Lower chest: Stable lung bases. Calcified coronary artery atherosclerosis. No cardiomegaly, pericardial or pleural effusion. Hepatobiliary: Diminutive or absent gallbladder as before. Bile ducts appear stable.  Pancreas: Stable pancreatic atrophy. Spleen: Stable, diminutive. Adrenals/Urinary Tract: Negative adrenal glands. Diminutive but otherwise negative noncontrast kidneys. No nephrolithiasis or hydronephrosis. Decompressed proximal ureters. Diminutive and negative urinary bladder. Incidental pelvic phleboliths. Stomach/Bowel: Gas and low-density stool in the rectum. Abundant retained stool in the sigmoid colon with diverticulosis redemonstrated. No active inflammation. Decompressed left colon, transverse colon. Redundant but otherwise negative right colon. Negative terminal ileum. No pericecal inflammation. Appendix not identified and might be diminutive or absent. No dilated small bowel. Increased gas in the stomach which otherwise appears negative. Second portion duodenum diverticulum redemonstrated with no adverse features. No free air, free fluid, mesenteric stranding. Vascular/Lymphatic: Normal caliber abdominal aorta. Aortoiliac calcified atherosclerosis. Hypodensity within the major vascular lumen suggests anemia. No lymphadenopathy. Reproductive: Negative noncontrast appearance. Other: No pelvic free fluid. Musculoskeletal: Osteopenia.  Motion artifact in the lower chest. Chronic T12 through L3 compression fractures appear stable. Advanced disc and facet degeneration at the lumbosacral junction. Chronic right inferior pubic ramus deformity. No acute osseous abnormality identified. IMPRESSION: 1. No acute or inflammatory process identified in the non-contrast abdomen or pelvis. 2. Abundant retained stool in the rectosigmoid colon. Sigmoid diverticulosis without active inflammation. 3. Vessel density suspicious for anemia. Calcified coronary artery and Aortic Atherosclerosis (ICD10-I70.0). Electronically Signed   By: Odessa Fleming M.D.   On: 06/19/2020 11:27   DG Chest Port 1 View  Result Date: 06/19/2020 CLINICAL DATA:  Weakness EXAM: PORTABLE CHEST 1 VIEW COMPARISON:  None. FINDINGS: Lungs are clear. Heart size and pulmonary vascularity are normal. No adenopathy. There is aortic atherosclerosis. No bone lesions. IMPRESSION: Lungs clear. Heart size normal. Aortic Atherosclerosis (ICD10-I70.0). Electronically Signed   By: Bretta Bang III M.D.   On: 06/19/2020 10:22      EKG: sinus tachycardia   A & P   Principal Problem:   Severe sepsis (HCC) Active Problems:   Weakness generalized   Acute kidney injury (HCC)   Gastrointestinal hemorrhage   Acute blood loss anemia   Acute metabolic encephalopathy   1. Severe sepsis, concern for developing shock a. Concern for infectious and/or hypovolemia due to blood loss b. Sepsis criteria: Hypothermia, tachycardia, leukocytosis, lactic acidosis with AKI and soft blood pressure c. SOFA Score > 2: Cr 2.0-3.4 [2 pts] and MAP < 70, No pressors [1 pt]  d. Inaccurate weight so unclear if she has received weight-based fluids e. Follow-up blood cultures f. Change LR to NS and repeat lactic acid g. Continue metronidazole and cefepime for now to cover for an abdominal infection h. Palliative care consulted  2. Blood loss anemia secondary to GI bleed a. Hb 10.2-> 6.3 in 2 weeks and  recently hospitalized for GI bleed without endoscopic intervention b. BUN 95 c. Son is okay with endoscopic intervention if necessary d. GI consulted, discussed with Dr. Dulce Sellar e. 2 units PRBCs ordered f. Protonix IV twice daily g. Trend H/H h. N.p.o. for possible intervention i. Palliative care consulted  3. Syncope, likely vasovagal versus orthostatic in the setting of anemia a. Occurred during BM this a.m. and lasted a few seconds b. telemetry c. Treatment as above  4. AKI on CKD 3b a. Baseline creatinine 1.1, creatinine 2.12 today b. IV fluids and blood transfusion  5. Failure to thrive, multifactorial: anemia, AKI, sepsis, debility  6. Elevated troponin, suspect secondary to demand ischemia a. Troponin 40 x2  7. Altered mental status, suspect acute toxic metabolic encephalopathy and possibly hypoperfusion a. Continue treatment as above    DVT prophylaxis: none   Code  Status: DNR  Diet: npo Family Communication: Admission, patients condition and plan of care including tests being ordered have been discussed with the patient who indicates understanding and agrees with the plan and Code Status. Patient's son was updated  Disposition Plan: The appropriate patient status for this patient is INPATIENT. Inpatient status is judged to be reasonable and necessary in order to provide the required intensity of service to ensure the patient's safety. The patient's presenting symptoms, physical exam findings, and initial radiographic and laboratory data in the context of their chronic comorbidities is felt to place them at high risk for further clinical deterioration. Furthermore, it is not anticipated that the patient will be medically stable for discharge from the hospital within 2 midnights of admission. The following factors support the patient status of inpatient.   " The patient's presenting symptoms include syncope, GI bleed. " The worrisome physical exam findings include pallor,  soft BPs. " The initial radiographic and laboratory data are worrisome because of anemia, lactic acidosis, leukocytosis, aki " The chronic co-morbidities include recent GI bleed.   * I certify that at the point of admission it is my clinical judgment that the patient will require inpatient hospital care spanning beyond 2 midnights from the point of admission due to high intensity of service, high risk for further deterioration and high frequency of surveillance required.*   Status is: Inpatient  Remains inpatient appropriate because:Hemodynamically unstable, Ongoing diagnostic testing needed not appropriate for outpatient work up and IV treatments appropriate due to intensity of illness or inability to take PO    Dispo: The patient is from: Home              Anticipated d/c is to: TBD              Anticipated d/c date is: > 3 days              Patient currently is not medically stable to d/c.   Difficult to place patient No         The medical decision making on this patient was of high complexity and the patient is at high risk for clinical deterioration, therefore this is a level 3 admission.  Consultants  . GI  Procedures  . 2 u PRBCs  Time Spent on Admission: 75 minutes    Jae Dire, DO [facility]  06/19/2020, 1:16 PM

## 2020-06-19 NOTE — Progress Notes (Signed)
Pharmacy Antibiotic Note  Elizabeth Jarvis is a 87 y.o. female admitted on 06/19/2020 with intra-abdominal infection.  Pharmacy has been consulted for Cefepime dosing.  Plan: Cefepime 2g IV q24h  Metronidazole per MD Follow up renal function, culture results, and clinical course.  Height: 5' (152.4 cm) Weight: 59 kg (130 lb) IBW/kg (Calculated) : 45.5  Temp (24hrs), Avg:95.7 F (35.4 C), Min:95.7 F (35.4 C), Max:95.7 F (35.4 C)  Recent Labs  Lab 06/19/20 0929 06/19/20 1148  WBC 19.6*  --   CREATININE 2.12*  --   LATICACIDVEN 5.1* 4.0*    Estimated Creatinine Clearance: 12.5 mL/min (A) (by C-G formula based on SCr of 2.12 mg/dL (H)).    No Known Allergies  Antimicrobials this admission: 2/12 Cefepime >> 2/12 Metronidazole  >>  Dose adjustments this admission:   Microbiology results: 2/12 COVID: neg 2/12 Influenza A/B: neg/neg 2/12 BCx:    Thank you for allowing pharmacy to be a part of this patient's care.  Lynann Beaver PharmD, BCPS Clinical Pharmacist [facility] main pharmacy [phone] 06/19/2020 12:36 PM

## 2020-06-19 NOTE — Consult Note (Signed)
[facility] Consultation Note  Referring Provider: [facility] Primary Care Physician:  Pcp, No  Reason for Consultation:  Anemia, GI bleed  HPI: Elizabeth Jarvis is a 87 y.o. female whom we've been asked to seen for anemia and GI bleed.  Patient seen last month for progressive anemia without overt bleeding, and it was decided at that time to pursue medical management.  Patient comes back today after found down by son (altered mental status).  He reports some coffee ground-type stool.  No obvious hematemesis or prior GI bleeding leading up to that time.  Patient found to be hypotensive and acidotic and has had drop in Hgb to 4.3.  Patient has left upper abdominal discomfort, which has progressively worsened over the past few weeks.  Baby ASA in past, no NSAIDs or anticoagulants over the past several weeks.     Past Medical History:  Diagnosis Date  . GERD (gastroesophageal reflux disease)   . Hypertension     History reviewed. No pertinent surgical history.  Prior to Admission medications   Medication Sig Start Date End Date Taking? Authorizing Provider  b complex vitamins tablet Take 1 tablet by mouth daily.   Yes [provider]  calcium-vitamin D (OSCAL WITH D) 500-200 MG-UNIT per tablet Take 1 tablet by mouth daily with breakfast.   Yes [provider]  donepezil (ARICEPT) 5 MG tablet Take 5 mg by mouth daily. 06/11/20  Yes [provider]  Magnesium Hydroxide (DULCOLAX PO) Take 1 tablet by mouth daily as needed (constipation).   Yes [provider]  megestrol (MEGACE) 40 MG/ML suspension Take 400 mg by mouth 2 (two) times daily. 06/11/20  Yes [provider]  nystatin (MYCOSTATIN) 100000 UNIT/ML suspension Take 5 mLs by mouth 3 (three) times daily. 06/08/20  Yes [provider]  ondansetron (ZOFRAN) 4 MG tablet Take 1 tablet (4 mg total) by mouth every 6 (six) hours as needed for nausea. 06/02/20  Yes Alwyn RenMathews, Elizabeth G,  MD  pantoprazole (PROTONIX) 40 MG tablet Take 1 tablet (40 mg total) by mouth 2 (two) times daily. 06/02/20  Yes Alwyn RenMathews, Elizabeth G, MD  polyethylene glycol (MIRALAX) 17 g packet Take 17 g by mouth 2 (two) times daily. 06/02/20  Yes Alwyn RenMathews, Elizabeth G, MD  Polyethylene Glycol 400 (BLINK TEARS OP) Place 1 drop into both eyes 3 (three) times daily.   Yes [provider]  psyllium (HYDROCIL/METAMUCIL) 95 % PACK Take 1 packet by mouth 2 (two) times daily. 06/02/20  Yes Alwyn RenMathews, Elizabeth G, MD    Current Facility-Administered Medications  Medication Dose Route Frequency Provider Last Rate Last Admin  . 0.9 %  sodium chloride infusion   Intravenous Continuous Jae DireSegal, Jared E, MD 100 mL/hr at 06/19/20 1407 New Bag/Given (Non-Interop) at 06/19/20 1407  . [START ON 06/20/2020] ceFEPIme (MAXIPIME) 2 g in sodium chloride 0.9 % 100 mL IVPB  2 g Intravenous Q24H Shade, Jacqulyn CaneChristine E, RPH      . metroNIDAZOLE (FLAGYL) IVPB 500 mg  500 mg Intravenous Q8H Jae DireSegal, Jared E, MD      . ondansetron [facility](ZOFRAN) tablet 4 mg  4 mg Oral Q6H PRN Jae DireSegal, Jared E, MD       Or  . ondansetron [facility](ZOFRAN) injection 4 mg  4 mg Intravenous Q6H PRN Jae DireSegal, Jared E, MD      . pantoprazole (PROTONIX) injection 40 mg  40 mg Intravenous Q12H Whitney PostSegal, Jared E, MD      . sodium chloride flush (NS) 0.9 %  injection 3 mL  3 mL Intravenous Q12H Jae Dire, MD   3 mL at 06/19/20 1408   Current Outpatient Medications  Medication Sig Dispense Refill  . b complex vitamins tablet Take 1 tablet by mouth daily.    . calcium-vitamin D (OSCAL WITH D) 500-200 MG-UNIT per tablet Take 1 tablet by mouth daily with breakfast.    . donepezil (ARICEPT) 5 MG tablet Take 5 mg by mouth daily.    . Magnesium Hydroxide (DULCOLAX PO) Take 1 tablet by mouth daily as needed (constipation).    . megestrol (MEGACE) 40 MG/ML suspension Take 400 mg by mouth 2 (two) times daily.    Marland Kitchen nystatin (MYCOSTATIN) 100000 UNIT/ML suspension Take 5 mLs by mouth 3 (three)  times daily.    . ondansetron (ZOFRAN) 4 MG tablet Take 1 tablet (4 mg total) by mouth every 6 (six) hours as needed for nausea. 20 tablet 0  . pantoprazole (PROTONIX) 40 MG tablet Take 1 tablet (40 mg total) by mouth 2 (two) times daily. 60 tablet 1  . polyethylene glycol (MIRALAX) 17 g packet Take 17 g by mouth 2 (two) times daily. 14 each 0  . Polyethylene Glycol 400 (BLINK TEARS OP) Place 1 drop into both eyes 3 (three) times daily.    . psyllium (HYDROCIL/METAMUCIL) 95 % PACK Take 1 packet by mouth 2 (two) times daily. 240 each     Allergies as of 06/19/2020  . (No Known Allergies)    History reviewed. No pertinent family history.  Social History   Socioeconomic History  . Marital status: Single    Spouse name: Not on file  . Number of children: Not on file  . Years of education: Not on file  . Highest education level: Not on file  Occupational History  . Not on file  Tobacco Use  . Smoking status: Never Smoker  . Smokeless tobacco: Never Used  Substance and Sexual Activity  . Alcohol use: No  . Drug use: No  . Sexual activity: Never  Other Topics Concern  . Not on file  Social History Narrative  . Not on file   Social Determinants of Health   Financial Resource Strain: Not on file  Food Insecurity: Not on file  Transportation Needs: Not on file  Physical Activity: Not on file  Stress: Not on file  Social Connections: Not on file  Intimate Partner Violence: Not on file    Review of Systems: As per HPI, all others negative  Physical Exam: Vital signs in last 24 hours: Temp:  [95.7 F (35.4 C)-96.3 F (35.7 C)] 96.3 F (35.7 C) (02/12 1355) Pulse Rate:  [98-106] 102 (02/12 1416) Resp:  [16-22] 17 (02/12 1416) BP: (103-118)/(40-95) 115/40 (02/12 1416) SpO2:  [98 %-100 %] 100 % (02/12 1416) Weight:  [59 kg] 59 kg (02/12 1225)   General:   Thin, cachectic, ill-appearing Head:  Normocephalic and atraumatic. Eyes:  Sclera clear, no icterus.   Conjunctiva  pale Ears:  Normal auditory acuity. Nose:  No deformity, discharge,  or lesions. Mouth:  No deformity or lesions.  Oropharynx pale and dry Neck:  Supple; no masses or thyromegaly. Lungs:  Clear throughout to auscultation.   No wheezes, crackles, or rhonchi. No acute distress. Heart:  Regular rate and rhythm; no murmurs, clicks, rubs,  or gallops. Abdomen:  Soft, LUQ tenderness with voluntary guarding; no distention No masses, hepatosplenomegaly or hernias noted. Normal bowel sounds, without guarding, and without rebound.     Msk:  Profound cachexia and atrophy, Symmetrical without gross deformities. Normal posture. Pulses:  Normal pulses noted. Extremities:  Without clubbing or edema. Neurologic:  Alert and  oriented x4;  grossly normal neurologically. Skin:  Intact without significant lesions or rashes. Psych:  Alert and cooperative. Normal mood and affect.   Lab Results: Recent Labs    06/19/20 0929 06/19/20 1315  WBC 19.6*  --   HGB 6.3* 4.3*  HCT 19.8* 13.0*  PLT 328  --    BMET Recent Labs    06/19/20 0929  NA 135  K 4.5  CL 105  CO2 15*  GLUCOSE 199*  BUN 95*  CREATININE 2.12*  CALCIUM 9.0   LFT Recent Labs    06/19/20 0929  PROT 6.5  ALBUMIN 3.7  AST 21  ALT 15  ALKPHOS 47  BILITOT 0.7   PT/INR Recent Labs    06/19/20 0929  LABPROT 15.0  INR 1.2    Studies/Results: CT ABDOMEN PELVIS WO CONTRAST  Result Date: 06/19/2020 CLINICAL DATA:  87 year old female with abdominal pain. Known large bowel diverticula. EXAM: CT ABDOMEN AND PELVIS WITHOUT CONTRAST TECHNIQUE: Multidetector CT imaging of the abdomen and pelvis was performed following the standard protocol without IV contrast. COMPARISON:  CT Abdomen and Pelvis with contrast 05/30/2020. FINDINGS: Lower chest: Stable lung bases. Calcified coronary artery atherosclerosis. No cardiomegaly, pericardial or pleural effusion. Hepatobiliary: Diminutive or absent gallbladder as before. Bile ducts appear stable.  Pancreas: Stable pancreatic atrophy. Spleen: Stable, diminutive. Adrenals/Urinary Tract: Negative adrenal glands. Diminutive but otherwise negative noncontrast kidneys. No nephrolithiasis or hydronephrosis. Decompressed proximal ureters. Diminutive and negative urinary bladder. Incidental pelvic phleboliths. Stomach/Bowel: Gas and low-density stool in the rectum. Abundant retained stool in the sigmoid colon with diverticulosis redemonstrated. No active inflammation. Decompressed left colon, transverse colon. Redundant but otherwise negative right colon. Negative terminal ileum. No pericecal inflammation. Appendix not identified and might be diminutive or absent. No dilated small bowel. Increased gas in the stomach which otherwise appears negative. Second portion duodenum diverticulum redemonstrated with no adverse features. No free air, free fluid, mesenteric stranding. Vascular/Lymphatic: Normal caliber abdominal aorta. Aortoiliac calcified atherosclerosis. Hypodensity within the major vascular lumen suggests anemia. No lymphadenopathy. Reproductive: Negative noncontrast appearance. Other: No pelvic free fluid. Musculoskeletal: Osteopenia. Motion artifact in the lower chest. Chronic T12 through L3 compression fractures appear stable. Advanced disc and facet degeneration at the lumbosacral junction. Chronic right inferior pubic ramus deformity. No acute osseous abnormality identified. IMPRESSION: 1. No acute or inflammatory process identified in the non-contrast abdomen or pelvis. 2. Abundant retained stool in the rectosigmoid colon. Sigmoid diverticulosis without active inflammation. 3. Vessel density suspicious for anemia. Calcified coronary artery and Aortic Atherosclerosis (ICD10-I70.0). Electronically Signed   By: Odessa Fleming M.D.   On: 06/19/2020 11:27   DG Chest Port 1 View  Result Date: 06/19/2020 CLINICAL DATA:  Weakness EXAM: PORTABLE CHEST 1 VIEW COMPARISON:  None. FINDINGS: Lungs are clear. Heart size  and pulmonary vascularity are normal. No adenopathy. There is aortic atherosclerosis. No bone lesions. IMPRESSION: Lungs clear. Heart size normal. Aortic Atherosclerosis (ICD10-I70.0). Electronically Signed   By: Bretta Bang III M.D.   On: 06/19/2020 10:22    Impression:  1.  Anemia, acute on subacute.  Currently Hgb 4.3. 2.  Hypotension, lactic acidosis, shock, acute renal failure.  Do not think this is exclusively from hemorrhagic shock as per below. 3.  Melena X 1 per report.  I can not correlate the degree of anemia and hypotension based solely on the  degree of bleeding noted.  Wonder if patient could have mesenteric ischemia or some other type of intra-abdominal catastrophe not appreciated on CT studies.  Plan:  1.  Medical management:  Volume resuscitation (IVF, PRBC), PPI. 2.  Patient is in absolutely no shape for endoscopy.   3.  If patient makes it through the night and improves with volume resuscitation, we will readdress in AM whether or not to perform any specific GI intervention. 4.  NPO.  Empiric antibiotics. 5.  Case reviewed with Dr. Dairl Ponder [facility]) and patient's son Elizabeth Jarvis). 6.  Prognosis guarded.  Palliative care medicine evaluation reasonable. 7.  [facility] will follow.   LOS: 0 days   Effie Janoski M  06/19/2020, 2:34 PM  Cell [phone] If no answer or after 5 PM call [phone]

## 2020-06-20 DIAGNOSIS — N179 Acute kidney failure, unspecified: Secondary | ICD-10-CM | POA: Diagnosis not present

## 2020-06-20 DIAGNOSIS — Z515 Encounter for palliative care: Secondary | ICD-10-CM

## 2020-06-20 DIAGNOSIS — R6520 Severe sepsis without septic shock: Secondary | ICD-10-CM | POA: Diagnosis not present

## 2020-06-20 DIAGNOSIS — D62 Acute posthemorrhagic anemia: Secondary | ICD-10-CM | POA: Diagnosis not present

## 2020-06-20 DIAGNOSIS — G9341 Metabolic encephalopathy: Secondary | ICD-10-CM | POA: Diagnosis not present

## 2020-06-20 DIAGNOSIS — A419 Sepsis, unspecified organism: Secondary | ICD-10-CM | POA: Diagnosis not present

## 2020-06-20 LAB — BASIC METABOLIC PANEL
Anion gap: 8 (ref 5–15)
BUN: 76 mg/dL — ABNORMAL HIGH (ref 8–23)
CO2: 17 mmol/L — ABNORMAL LOW (ref 22–32)
Calcium: 8.2 mg/dL — ABNORMAL LOW (ref 8.9–10.3)
Chloride: 114 mmol/L — ABNORMAL HIGH (ref 98–111)
Creatinine, Ser: 1.74 mg/dL — ABNORMAL HIGH (ref 0.44–1.00)
GFR, Estimated: 27 mL/min — ABNORMAL LOW (ref >60)
Glucose, Bld: 96 mg/dL (ref 70–99)
Potassium: 4.3 mmol/L (ref 3.5–5.1)
Sodium: 139 mmol/L (ref 135–145)

## 2020-06-20 LAB — HEMOGLOBIN AND HEMATOCRIT, BLOOD
HCT: 30.4 % — ABNORMAL LOW (ref 36.0–46.0)
HCT: 22.9 % — ABNORMAL LOW (ref 36.0–46.0)
Hemoglobin: 9.8 g/dL — ABNORMAL LOW (ref 12.0–15.0)
Hemoglobin: 7.9 g/dL — ABNORMAL LOW (ref 12.0–15.0)

## 2020-06-20 LAB — CBC
HCT: 24.9 % — ABNORMAL LOW (ref 36.0–46.0)
Hemoglobin: 8.6 g/dL — ABNORMAL LOW (ref 12.0–15.0)
MCH: 30 pg (ref 26.0–34.0)
MCHC: 34.5 g/dL (ref 30.0–36.0)
MCV: 86.8 fL (ref 80.0–100.0)
Platelets: 162 10*3/uL (ref 150–400)
RBC: 2.87 MIL/uL — ABNORMAL LOW (ref 3.87–5.11)
RDW: 16.9 % — ABNORMAL HIGH (ref 11.5–15.5)
WBC: 16.1 10*3/uL — ABNORMAL HIGH (ref 4.0–10.5)
nRBC: 0.2 % (ref 0.0–0.2)

## 2020-06-20 LAB — PROCALCITONIN: Procalcitonin: <0.1 ng/mL

## 2020-06-20 LAB — PROTIME-INR
INR: 1.3 — ABNORMAL HIGH (ref 0.8–1.2)
Prothrombin Time: 15.8 seconds — ABNORMAL HIGH (ref 11.4–15.2)

## 2020-06-20 MED ORDER — SODIUM CHLORIDE 0.9 % IV SOLN: INTRAVENOUS | Status: DC

## 2020-06-20 MED ORDER — SODIUM CHLORIDE 0.9 % IV BOLUS
1000.0000 mL | Freq: Once | INTRAVENOUS | Status: AC
  Administered 2020-06-20: 1000 mL via INTRAVENOUS

## 2020-06-20 NOTE — Progress Notes (Signed)
Pts son updated via phone. Son is permitted to stay over night/after visiting hours by [facility] and charge nurse d/t language barrier.

## 2020-06-20 NOTE — Progress Notes (Signed)
Upon arrival to the unit from the ED pts manual BP found to be 60/40. Pt alert, extremities cold. Rapid RN called to bedside, MD made aware. New orders placed and carried out.

## 2020-06-20 NOTE — ED Notes (Signed)
This Clinical research associate attempted blood draw. unsuccessful

## 2020-06-20 NOTE — Significant Event (Signed)
Rapid Response Event Note    Reason for Call :  Rapid called for manual blood pressure 60/40 upon patient arrival to progressive care unit.  Initial Focused Assessment:  Pt alert laying in bed. Skin color appropriate for ethnicity. Extremities cool. Pt speaks "Hindi" but interpretor states patient is speaking another language that is not Hindi. Unable to assess patient orientation status.  Vital signs  83/46 (59) HR 112 ST Resp 28 O2 100% 5L [place] with poor wave form.  Interventions:  Pt admitted for recurrent GI bleed and syncope. Hgb as low as 3.9 while in the ED. Pt given a liter bolus of NS and H&H drawn. Hgb from 8.6 to 7.9. Per MD no transfusion at this time. Initiate maintenance fluids at 50cc/hr.  Plan of Care:  Pt remains on progressive unit. 1L NS bolus given and maintenance fluids started at 50cc/hr. Serial H&Hs q4 hours. No blood transfusion ordered at this time.   Event Summary:   MD Notified: Lucianne Muss, MD Call Time: 1805 Arrival Time: 1808 End Time: 1845  Jessica Priest, RN

## 2020-06-20 NOTE — Progress Notes (Signed)
Subjective: No reported bleeding.  Objective: Vital signs in last 24 hours: Temp:  [95.7 F (35.4 C)-98.9 F (37.2 C)] 98.4 F (36.9 C) (02/12 2317) Pulse Rate:  [79-106] 84 (02/13 0800) Resp:  [12-28] 14 (02/13 0800) BP: (92-128)/(40-95) 128/60 (02/13 0800) SpO2:  [98 %-100 %] 100 % (02/13 0800) Weight:  [59 kg] 59 kg (02/12 1225) Weight change:     PE: GEN:  Elderly, cachectic, chronically frail-appearing, but less tenuous-appearing today (cf yesterday) NEURO:  Confused ABD:  Soft, EG/LUQ tenderness  Lab Results: CBC    Component Value Date/Time   WBC 16.1 (H) 06/20/2020 0323   RBC 2.87 (L) 06/20/2020 0323   HGB 8.6 (L) 06/20/2020 0323   HCT 24.9 (L) 06/20/2020 0323   PLT 162 06/20/2020 0323   MCV 86.8 06/20/2020 0323   MCH 30.0 06/20/2020 0323   MCHC 34.5 06/20/2020 0323   RDW 16.9 (H) 06/20/2020 0323   LYMPHSABS 2.5 06/19/2020 0929   MONOABS 1.0 06/19/2020 0929   EOSABS 0.0 06/19/2020 0929   BASOSABS 0.0 06/19/2020 0929   CMP     Component Value Date/Time   NA 139 06/20/2020 0323   K 4.3 06/20/2020 0323   CL 114 (H) 06/20/2020 0323   CO2 17 (L) 06/20/2020 0323   GLUCOSE 96 06/20/2020 0323   BUN 76 (H) 06/20/2020 0323   CREATININE 1.74 (H) 06/20/2020 0323   CALCIUM 8.2 (L) 06/20/2020 0323   PROT 6.5 06/19/2020 0929   ALBUMIN 3.7 06/19/2020 0929   AST 21 06/19/2020 0929   ALT 15 06/19/2020 0929   ALKPHOS 47 06/19/2020 0929   BILITOT 0.7 06/19/2020 0929   GFRNONAA 27 (L) 06/20/2020 0323   GFRAA 54 (L) 01/16/2014 0416   Assessment:  1.  Anemia, acute on subacute.  Hgb nadir in 3's; now in 8's. 2.  Hypotension, lactic acidosis, shock, acute renal failure.  Do not think this is exclusively from hemorrhagic shock as per below.  Parameters improved overnight with volume resuscitation and transfusion. 3.  Melena X 1 per report.  I can not correlate the degree of anemia and hypotension based solely on the degree of bleeding noted.  Wonder if patient could  have mesenteric ischemia or some other type of intra-abdominal catastrophe not appreciated on CT studies. 4.  Overall, patient's hemodynamics and overall appearance have improved.  She does however remain confused.  Plan:  1.  As above, I am not convinced GI bleed is the sole explanation for patient's acute critical illness.  She has improved with volume resuscitation/transfusion and other supportive care, including antibiotics. 2.  In absence of overt destabilizing bleeding (which patient does not currently have), I would favor another 24 hours of supportive care, followed by our team revisiting patient tomorrow.  By tomorrow, patient likely to be as stable and appropriate for any further intervention (if needed) as possible, thus we could then make the definitive decision whether or not to continue supportive care alone versus pursuing endoscopy. 3.  Case discussed with hospitalist team as well as patient's son Darryll Capers). 4.  Clear liquid diet ok. 5.  [facility] will follow.   Freddy Jaksch 06/20/2020, 9:58 AM   Cell [phone] If no answer or after 5 PM call [phone]

## 2020-06-20 NOTE — Progress Notes (Signed)
CRITICAL VALUE ALERT  Critical Value: Hgb 7.9  Date & Time Notied:  1855, 06/20/20  Provider Notified: Lucianne Muss   Orders Received/Actions taken: H&H ordered for 11pm, instructions given to transfuse if Hgb falls below 7.

## 2020-06-20 NOTE — Progress Notes (Signed)
PROGRESS NOTE    Elizabeth Jarvis  INO:[id number] DOB: Jun 06, 1921 DOA: 06/19/2020 PCP: Pcp, No   Brief Narrative:  This 87 years old female with PMH significant for hypertension, GERD, chronic lower extremity lymphedema who was recently hospitalized from 1/23-1/26 for GI bleed and was discharged without intervention.  She presented in the ED with progressive weakness, recurrent GI bleed and altered mentation and syncopal episodes at home in the morning.  Patient was medically managed during previous hospitalization and was discharged on Protonix and was recommended outpatient follow-up.  Patient has progressive decline in overall health.  Per EMS she was found to be hypotensive with tachycardia and afebrile. In ED  Lactic acid 5.1, serum creatinine 2.12, WBC 19.6, chest x-ray unremarkable, FOBT positive, CT abdomen: no acute inflammatory process, retained stool in the rectosigmoid, sigmoid diverticulosis.  Patient is admitted for sepsis, started on IV antibiotics, patient was given,  IV fluids. GI consulted. patient was given 2 units of packed red blood cells.  Assessment & Plan:   Principal Problem:   Severe sepsis (HCC) Active Problems:   Weakness generalized   Acute kidney injury (HCC)   Anemia   Gastrointestinal hemorrhage   Acute blood loss anemia   Acute metabolic encephalopathy   Severe sepsis, concern for developing shock: She was found septic on admission,  sepsis criteria: hypothermia, tachycardia, leukocytosis, lactic acidosis with AKI and soft blood pressures. Concern for infectious and/or hypovolemia due to blood loss. Continue current antibiotics, metronidazole and cefepime to cover for abdominal infection. CT abdomen: no acute inflammatory process, retained stool in the rectosigmoid, sigmoid diverticulosis Follow blood cultures. Continue IV fluids, trend lactic acid. Consulted palliative care to discuss goals of care.  Blood loss anemia secondary to GI bleed: Hb 10.2->  6.3 in 2 weeks and recently hospitalized for GI bleed without endoscopic intervention BUN 95. Son is okay with the doing endoscopy if it is needed. Gastroenterology consulted,  states patient hemoglobin is stable.  if she bleeds will consider colonoscopy. Patient received 2 units of packed red blood cells,  posttransfusion hemoglobin is 8.6. Continue Protonix 40 mg IV twice daily, continue H&H every 6 hours. Continue n.p.o. for possible intervention  Syncope, likely vasovagal versus orthostatic in the setting of anemia Occurred during BM this a.m. and lasted a few seconds Continue telemetry  AKI on CKD 3b: Baseline creatinine 1.1, creatinine 2.12 today Continue IV fluids and blood transfusion  Failure to thrive, multifactorial: anemia, AKI, sepsis, debility  Elevated troponin, suspect secondary to demand ischemia Troponins 40x2 flat. Patient denies any chest pain. Consider cardiology if  troponin trending up.  Altered mental status, suspect acute toxic metabolic encephalopathy and possibly hypoperfusion: Continue treatment as above.   DVT prophylaxis: SCDs Code Status: DNR Family Communication:  No family at bed side. Disposition Plan:   Status is: Inpatient  Remains inpatient appropriate because:Inpatient level of care appropriate due to severity of illness   Dispo: The patient is from: Home              Anticipated d/c is to: SNF              Anticipated d/c date is: 3 days              Patient currently is not medically stable to d/c.   Difficult to place patient No   Consultants:   Gastroenterology  Procedures:  Antimicrobials:  Anti-infectives (From admission, onward)   Start     Dose/Rate Route Frequency Ordered Stop  06/20/20 1000  ceFEPIme (MAXIPIME) 2 g in sodium chloride 0.9 % 100 mL IVPB        2 g 200 mL/hr over 30 Minutes Intravenous Every 24 hours 06/19/20 1248     06/19/20 1315  ceFEPIme (MAXIPIME) 2 g in sodium chloride 0.9 % 100 mL IVPB   Status:  Discontinued        2 g 200 mL/hr over 30 Minutes Intravenous  Once 06/19/20 1314 06/19/20 1323   06/19/20 1315  metroNIDAZOLE (FLAGYL) IVPB 500 mg        500 mg 100 mL/hr over 60 Minutes Intravenous Every 8 hours 06/19/20 1314     06/19/20 1100  ceFEPIme (MAXIPIME) 2 g in sodium chloride 0.9 % 100 mL IVPB        2 g 200 mL/hr over 30 Minutes Intravenous  Once 06/19/20 1056 06/19/20 1311   06/19/20 1100  metroNIDAZOLE (FLAGYL) IVPB 500 mg        500 mg 100 mL/hr over 60 Minutes Intravenous  Once 06/19/20 1056 06/19/20 1321     Subjective: Patient was seen and examined at bedside.  Overnight events noted.   Patient seems confused but alert and arousable.  She denies any chest pain or shortness of breath.  Objective: Vitals:   06/20/20 1344 06/20/20 1354 06/20/20 1441 06/20/20 1615  BP: (!) 89/64 95/79 100/69 113/88  Pulse:  (!) 109    Resp:  (!) 22  14  Temp:      TempSrc:      SpO2:  99%    Weight:      Height:        Intake/Output Summary (Last 24 hours) at 06/20/2020 1703 Last data filed at 06/20/2020 0000 Gross per 24 hour  Intake 2144.33 ml  Output --  Net 2144.33 ml   Filed Weights   06/19/20 1225  Weight: 59 kg    Examination:  General exam: Appears calm and comfortable, not in any distress. Respiratory system: Clear to auscultation. Respiratory effort normal. Cardiovascular system: S1 & S2 heard, RRR. No JVD, murmurs, rubs, gallops or clicks. No pedal edema. Gastrointestinal system: Abdomen is nondistended, soft and nontender. No organomegaly or masses felt. Normal bowel sounds heard. Central nervous system: Alert and oriented. No focal neurological deficits. Extremities: Symmetric 5 x 5 power. Skin: No rashes, lesions or ulcers Psychiatry: Judgement and insight appear normal. Mood & affect appropriate.     Data Reviewed: I have personally reviewed following labs and imaging studies  CBC: Recent Labs  Lab 06/19/20 0929 06/19/20 1315  06/19/20 1915 06/20/20 0056 06/20/20 0323  WBC 19.6*  --   --   --  16.1*  NEUTROABS 15.8*  --   --   --   --   HGB 6.3* 4.3* 3.9* 9.8* 8.6*  HCT 19.8* 13.0* 12.9* 30.4* 24.9*  MCV 90.8  --   --   --  86.8  PLT 328  --   --   --  162   Basic Metabolic Panel: Recent Labs  Lab 06/19/20 0929 06/19/20 1315 06/20/20 0323  NA 135  --  139  K 4.5  --  4.3  CL 105  --  114*  CO2 15*  --  17*  GLUCOSE 199*  --  96  BUN 95*  --  76*  CREATININE 2.12*  --  1.74*  CALCIUM 9.0  --  8.2*  MG  --  2.2  --    GFR: Estimated Creatinine Clearance: 15.2  mL/min (A) (by C-G formula based on SCr of 1.74 mg/dL (H)). Liver Function Tests: Recent Labs  Lab 06/19/20 0929  AST 21  ALT 15  ALKPHOS 47  BILITOT 0.7  PROT 6.5  ALBUMIN 3.7   No results for input(s): LIPASE, AMYLASE in the last 168 hours. No results for input(s): AMMONIA in the last 168 hours. Coagulation Profile: Recent Labs  Lab 06/19/20 0929 06/20/20 0323  INR 1.2 1.3*   Cardiac Enzymes: No results for input(s): CKTOTAL, CKMB, CKMBINDEX, TROPONINI in the last 168 hours. BNP (last 3 results) No results for input(s): PROBNP in the last 8760 hours. HbA1C: No results for input(s): HGBA1C in the last 72 hours. CBG: Recent Labs  Lab 06/19/20 1006  GLUCAP 146*   Lipid Profile: No results for input(s): CHOL, HDL, LDLCALC, TRIG, CHOLHDL, LDLDIRECT in the last 72 hours. Thyroid Function Tests: No results for input(s): TSH, T4TOTAL, FREET4, T3FREE, THYROIDAB in the last 72 hours. Anemia Panel: No results for input(s): VITAMINB12, FOLATE, FERRITIN, TIBC, IRON, RETICCTPCT in the last 72 hours. Sepsis Labs: Recent Labs  Lab 06/19/20 0929 06/19/20 1148 06/20/20 0323  PROCALCITON  --   --  <0.10  LATICACIDVEN 5.1* 4.0*  --     Recent Results (from the past 240 hour(s))  Resp Panel by RT-PCR (Flu A&B, Covid) Nasopharyngeal Swab     Status: None   Collection Time: 06/19/20  9:31 AM   Specimen: Nasopharyngeal Swab;  Nasopharyngeal(NP) swabs in vial transport medium  Result Value Ref Range Status   SARS Coronavirus 2 by RT PCR NEGATIVE NEGATIVE Final    Comment: (NOTE) SARS-CoV-2 target nucleic acids are NOT DETECTED.  The SARS-CoV-2 RNA is generally detectable in upper respiratory specimens during the acute phase of infection. The lowest concentration of SARS-CoV-2 viral copies this assay can detect is 138 copies/mL. A negative result does not preclude SARS-Cov-2 infection and should not be used as the sole basis for treatment or other patient management decisions. A negative result may occur with  improper specimen collection/handling, submission of specimen other than nasopharyngeal swab, presence of viral mutation(s) within the areas targeted by this assay, and inadequate number of viral copies(<138 copies/mL). A negative result must be combined with clinical observations, patient history, and epidemiological information. The expected result is Negative.  Fact Sheet for Patients:  BloggerCourse.com  Fact Sheet for Healthcare Providers:  SeriousBroker.it  This test is no t yet approved or cleared by the [place] FDA and  has been authorized for detection and/or diagnosis of SARS-CoV-2 by FDA under an Emergency Use Authorization (EUA). This EUA will remain  in effect (meaning this test can be used) for the duration of the COVID-19 declaration under Section 564(b)(1) of the Act, 21 U.S.C.section 360bbb-3(b)(1), unless the authorization is terminated  or revoked sooner.       Influenza A by PCR NEGATIVE NEGATIVE Final   Influenza B by PCR NEGATIVE NEGATIVE Final    Comment: (NOTE) The Xpert Xpress SARS-CoV-2/FLU/RSV plus assay is intended as an aid in the diagnosis of influenza from Nasopharyngeal swab specimens and should not be used as a sole basis for treatment. Nasal washings and aspirates are unacceptable for Xpert Xpress  SARS-CoV-2/FLU/RSV testing.  Fact Sheet for Patients: BloggerCourse.com  Fact Sheet for Healthcare Providers: SeriousBroker.it  This test is not yet approved or cleared by the [place] FDA and has been authorized for detection and/or diagnosis of SARS-CoV-2 by FDA under an Emergency Use Authorization (EUA). This EUA will remain in  effect (meaning this test can be used) for the duration of the COVID-19 declaration under Section 564(b)(1) of the Act, 21 U.S.C. section 360bbb-3(b)(1), unless the authorization is terminated or revoked.  Performed at [facility], [address] [place], [place] [address]      Radiology Studies: CT ABDOMEN PELVIS WO CONTRAST  Result Date: 06/19/2020 CLINICAL DATA:  87 year old female with abdominal pain. Known large bowel diverticula. EXAM: CT ABDOMEN AND PELVIS WITHOUT CONTRAST TECHNIQUE: Multidetector CT imaging of the abdomen and pelvis was performed following the standard protocol without IV contrast. COMPARISON:  CT Abdomen and Pelvis with contrast 05/30/2020. FINDINGS: Lower chest: Stable lung bases. Calcified coronary artery atherosclerosis. No cardiomegaly, pericardial or pleural effusion. Hepatobiliary: Diminutive or absent gallbladder as before. Bile ducts appear stable. Pancreas: Stable pancreatic atrophy. Spleen: Stable, diminutive. Adrenals/Urinary Tract: Negative adrenal glands. Diminutive but otherwise negative noncontrast kidneys. No nephrolithiasis or hydronephrosis. Decompressed proximal ureters. Diminutive and negative urinary bladder. Incidental pelvic phleboliths. Stomach/Bowel: Gas and low-density stool in the rectum. Abundant retained stool in the sigmoid colon with diverticulosis redemonstrated. No active inflammation. Decompressed left colon, transverse colon. Redundant but otherwise negative right colon. Negative terminal ileum. No pericecal inflammation. Appendix  not identified and might be diminutive or absent. No dilated small bowel. Increased gas in the stomach which otherwise appears negative. Second portion duodenum diverticulum redemonstrated with no adverse features. No free air, free fluid, mesenteric stranding. Vascular/Lymphatic: Normal caliber abdominal aorta. Aortoiliac calcified atherosclerosis. Hypodensity within the major vascular lumen suggests anemia. No lymphadenopathy. Reproductive: Negative noncontrast appearance. Other: No pelvic free fluid. Musculoskeletal: Osteopenia. Motion artifact in the lower chest. Chronic T12 through L3 compression fractures appear stable. Advanced disc and facet degeneration at the lumbosacral junction. Chronic right inferior pubic ramus deformity. No acute osseous abnormality identified. IMPRESSION: 1. No acute or inflammatory process identified in the non-contrast abdomen or pelvis. 2. Abundant retained stool in the rectosigmoid colon. Sigmoid diverticulosis without active inflammation. 3. Vessel density suspicious for anemia. Calcified coronary artery and Aortic Atherosclerosis (ICD10-I70.0). Electronically Signed   By: Odessa FlemingH  Hall M.D.   On: 06/19/2020 11:27   DG Chest Port 1 View  Result Date: 06/19/2020 CLINICAL DATA:  Weakness EXAM: PORTABLE CHEST 1 VIEW COMPARISON:  None. FINDINGS: Lungs are clear. Heart size and pulmonary vascularity are normal. No adenopathy. There is aortic atherosclerosis. No bone lesions. IMPRESSION: Lungs clear. Heart size normal. Aortic Atherosclerosis (ICD10-I70.0). Electronically Signed   By: Bretta BangWilliam  Woodruff III M.D.   On: 06/19/2020 10:22    Scheduled Meds: . pantoprazole (PROTONIX) IV  40 mg Intravenous Q12H  . sodium chloride flush  3 mL Intravenous Q12H   Continuous Infusions: . ceFEPime (MAXIPIME) IV Stopped (06/20/20 1133)  . metronidazole 500 mg (06/20/20 1132)     LOS: 1 day    Time spent: 35 mins    Decklyn Hyder, MD [facility]   If 7PM-7AM, please  contact night-coverage

## 2020-06-20 NOTE — ED Notes (Signed)
Report called to 4E.

## 2020-06-21 ENCOUNTER — Other Ambulatory Visit: Payer: Self-pay

## 2020-06-21 ENCOUNTER — Inpatient Hospital Stay (HOSPITAL_COMMUNITY): Payer: Medicare Other

## 2020-06-21 DIAGNOSIS — A419 Sepsis, unspecified organism: Secondary | ICD-10-CM | POA: Diagnosis not present

## 2020-06-21 DIAGNOSIS — Z7189 Other specified counseling: Secondary | ICD-10-CM | POA: Diagnosis not present

## 2020-06-21 DIAGNOSIS — L8990 Pressure ulcer of unspecified site, unspecified stage: Secondary | ICD-10-CM | POA: Insufficient documentation

## 2020-06-21 DIAGNOSIS — D62 Acute posthemorrhagic anemia: Secondary | ICD-10-CM | POA: Diagnosis not present

## 2020-06-21 DIAGNOSIS — Z515 Encounter for palliative care: Secondary | ICD-10-CM | POA: Diagnosis not present

## 2020-06-21 DIAGNOSIS — R6520 Severe sepsis without septic shock: Secondary | ICD-10-CM | POA: Diagnosis not present

## 2020-06-21 LAB — HEMOGLOBIN AND HEMATOCRIT, BLOOD
HCT: 20.8 % — ABNORMAL LOW (ref 36.0–46.0)
Hemoglobin: 6.7 g/dL — CL (ref 12.0–15.0)

## 2020-06-21 LAB — PREPARE RBC (CROSSMATCH)

## 2020-06-21 MED ORDER — SODIUM CHLORIDE 0.9% IV SOLUTION: Freq: Once | INTRAVENOUS | Status: AC

## 2020-06-21 MED ORDER — TECHNETIUM TC 99M-LABELED RED BLOOD CELLS IV KIT
22.4000 | PACK | Freq: Once | INTRAVENOUS | Status: AC
  Administered 2020-06-21: 22.4 via INTRAVENOUS

## 2020-06-21 MED ORDER — ENSURE ENLIVE PO LIQD
237.0000 mL | Freq: Two times a day (BID) | ORAL | Status: DC
  Administered 2020-06-23 – 2020-06-27 (×3): 237 mL via ORAL

## 2020-06-21 MED ORDER — ADULT MULTIVITAMIN W/MINERALS CH
1.0000 | ORAL_TABLET | Freq: Every day | ORAL | Status: DC
  Administered 2020-06-24 – 2020-06-28 (×5): 1 via ORAL
  Filled 2020-06-21 (×6): qty 1

## 2020-06-21 MED ORDER — BOOST / RESOURCE BREEZE PO LIQD CUSTOM
1.0000 | Freq: Three times a day (TID) | ORAL | Status: DC
  Administered 2020-06-22 – 2020-06-27 (×10): 1 via ORAL

## 2020-06-21 MED ORDER — PROSOURCE PLUS PO LIQD
30.0000 mL | Freq: Two times a day (BID) | ORAL | Status: DC
  Administered 2020-06-23 – 2020-06-26 (×5): 30 mL via ORAL
  Filled 2020-06-21 (×6): qty 30

## 2020-06-21 NOTE — Consult Note (Signed)
Consultation Note Date: 06/21/2020   Patient Name: Elizabeth Jarvis  DOB: 01/01/1922  MRN: [id number]  Age / Sex: 87 y.o., female  PCP: Pcp, No Referring Physician: Cipriano Bunker, MD  Reason for Consultation: Establishing goals of care  HPI/Patient Profile: 87 y.o. female  with past medical history of hypertension, GERD, chronic lower extremity lymphedema recently hospitalized from 1/23-1/26 for GI bleed without intervention who presented to the ED with progressive weakness, recurrent GI bleed, and altered mental status followed by syncopal episode at home.  Admitted on 06/19/2020 with sepsis with concern for shock, blood loss anemia secondary to GI bleed, AKI, and syncopal episode with altered mental status.  Discussion with son has revealed desire for treating potentially treatable conditions but not pursuing overly aggressive interventions.  Her situation today appears to be somewhat improved from yesterday, however, she does remain critically ill.  She has been evaluated by gastroenterology who is recommending continuation of current interventions and reassessing her situation tomorrow.  In the past she had wanted to avoid invasive procedures such as endoscopy if possible.  Palliative consulted for goals of care.  Clinical Assessment and Goals of Care: Plan of care consult received.  Chart reviewed including personal review of pertinent labs and imaging.  I saw and examined Elizabeth Jarvis today.  She was lying in bed in ED in no distress.  Vital signs seems stable and she was awake and interactive, however she seemed to be confused.  I attempted to talk with her about situation, however, she was not really able to participate.  I am not sure if this is more from confusion or she is also documented to be hard of hearing and I was wearing mask and face shield at time of my encounter which can make communication  difficult.  I spoke with bedside RN who reports that her son was present all night and went home to try and get some rest in case he needs to come back tonight to sit with her.  SUMMARY OF RECOMMENDATIONS   - DNR - Continue current interventions.  Dr. Dulce Sellar has spoken with son today and plan is to continue with current interventions and reassess her situation tomorrow. - Patient's son was with her all night in the emergency room and went home to rest for a few hours.  As plan is clear (continue current interventions and reassess her situation tomorrow) and he went home to rest, I did not reach out to him today.  I will plan to follow-up with Elizabeth Jarvis and her son tomorrow.  Code Status/Advance Care Planning:  DNR Prognosis:   Guarded  Discharge Planning: To Be Determined      Primary Diagnoses: Present on Admission: . Severe sepsis (HCC) . Acute kidney injury (HCC) . Gastrointestinal hemorrhage . Anemia   I have reviewed the medical record, interviewed the patient and family, and examined the patient. The following aspects are pertinent.  Past Medical History:  Diagnosis Date  . GERD (gastroesophageal reflux disease)   . Hypertension  Social History   Socioeconomic History  . Marital status: Single    Spouse name: Not on file  . Number of children: Not on file  . Years of education: Not on file  . Highest education level: Not on file  Occupational History  . Not on file  Tobacco Use  . Smoking status: Never Smoker  . Smokeless tobacco: Never Used  Substance and Sexual Activity  . Alcohol use: No  . Drug use: No  . Sexual activity: Never  Other Topics Concern  . Not on file  Social History Narrative  . Not on file   Social Determinants of Health   Financial Resource Strain: Not on file  Food Insecurity: Not on file  Transportation Needs: Not on file  Physical Activity: Not on file  Stress: Not on file  Social Connections: Not on file   History  reviewed. No pertinent family history. Scheduled Meds: . feeding supplement  1 Container Oral TID BM  . feeding supplement  237 mL Oral BID BM  . pantoprazole (PROTONIX) IV  40 mg Intravenous Q12H  . sodium chloride flush  3 mL Intravenous Q12H   Continuous Infusions: . sodium chloride Stopped (06/21/20 1026)  . ceFEPime (MAXIPIME) IV 2 g (06/21/20 0919)  . metronidazole 500 mg (06/21/20 0811)   PRN Meds:.ondansetron **OR** ondansetron (ZOFRAN) IV Medications Prior to Admission:  Prior to Admission medications   Medication Sig Start Date End Date Taking? Authorizing Provider  b complex vitamins tablet Take 1 tablet by mouth daily.   Yes [provider]  calcium-vitamin D (OSCAL WITH D) 500-200 MG-UNIT per tablet Take 1 tablet by mouth daily with breakfast.   Yes [provider]  donepezil (ARICEPT) 5 MG tablet Take 5 mg by mouth daily. 06/11/20  Yes [provider]  Magnesium Hydroxide (DULCOLAX PO) Take 1 tablet by mouth daily as needed (constipation).   Yes [provider]  megestrol (MEGACE) 40 MG/ML suspension Take 400 mg by mouth 2 (two) times daily. 06/11/20  Yes [provider]  nystatin (MYCOSTATIN) 100000 UNIT/ML suspension Take 5 mLs by mouth 3 (three) times daily. 06/08/20  Yes [provider]  ondansetron (ZOFRAN) 4 MG tablet Take 1 tablet (4 mg total) by mouth every 6 (six) hours as needed for nausea. 06/02/20  Yes Alwyn Ren, MD  pantoprazole (PROTONIX) 40 MG tablet Take 1 tablet (40 mg total) by mouth 2 (two) times daily. 06/02/20  Yes Alwyn Ren, MD  polyethylene glycol (MIRALAX) 17 g packet Take 17 g by mouth 2 (two) times daily. 06/02/20  Yes Alwyn Ren, MD  Polyethylene Glycol 400 (BLINK TEARS OP) Place 1 drop into both eyes 3 (three) times daily.   Yes [provider]  psyllium (HYDROCIL/METAMUCIL) 95 % PACK Take 1 packet by mouth 2 (two) times daily. 06/02/20  Yes Alwyn Ren, MD    No Known Allergies Review of Systems  Unable to obtain  Physical Exam  General: Awake but confused, in no distress, Bair hugger in place HEENT: No bruits, no goiter, no JVD Heart: Regular rate and rhythm. No murmur appreciated. Lungs: Good air movement, clear Abdomen: Soft, nontender, nondistended, positive bowel sounds.  Ext: Some edema Skin: Warm and dry Neuro: Does not really follow commands but moves 4 extremities   Vital Signs: BP 138/63   Pulse 93   Temp (!) 97.2 F (36.2 C) (Oral)   Resp 19   Ht 5' (1.524 m)   Wt 59.7  kg   SpO2 100%   BMI 25.70 kg/m  Pain Scale: 0-10   Pain Score: 0-No pain   SpO2: SpO2: 100 % O2 Device:SpO2: 100 % O2 Flow Rate: .O2 Flow Rate (L/min): 2 L/min  IO: Intake/output summary:   Intake/Output Summary (Last 24 hours) at 06/21/2020 1100 Last data filed at 06/21/2020 1026 Gross per 24 hour  Intake 1496 ml  Output --  Net 1496 ml    LBM: Last BM Date: 06/21/20 Baseline Weight: Weight: 59 kg Most recent weight: Weight: 59.7 kg     Palliative Assessment/Data:     Time In: 1400 Time Out: 1440 Time Total: 40 Greater than 50%  of this time was spent counseling and coordinating care related to the above assessment and plan.  Signed by: Romie Minus, MD   Please contact Palliative Medicine Team phone at [phone] for questions and concerns.  For individual provider: See Loretha Stapler

## 2020-06-21 NOTE — Plan of Care (Signed)
  Problem: Nutrition: Goal: Adequate nutrition will be maintained Outcome: Not Progressing   

## 2020-06-21 NOTE — Progress Notes (Signed)
CRITICAL VALUE ALERT  Critical Value:  Hemoglobin 6.7  Date & Time Notied:  06/21/2020  0121  Provider Notified: X. Blount NP  Orders Received/Actions taken: Awaiting new orders

## 2020-06-21 NOTE — Progress Notes (Signed)
Daily Progress Note   Patient Name: Elizabeth Jarvis       Date: 06/21/2020 DOB: [birth year]  Age: 87 y.o. MRN#: [id number] Attending Physician: Cipriano Bunker, MD Primary Care Physician: Pcp, No Admit Date: 06/19/2020  Reason for Consultation/Follow-up: Establishing goals of care  Subjective: I saw and examined Elizabeth Jarvis shortly after she returned from bleeding scan.  She was sleepy and not interactive other than opening her eyes.  I called and was able to reach patient's son, Elizabeth Jarvis.  We reviewed clinical course with plan for continued gentle medical interventions to see how she continues to respond.  There is not a source of bleeding noted on bleeding scan and plan is for EGD tomorrow.  Elizabeth Jarvis is in agreement with plan for EGD but is clear to state that his mother would not want overly aggressive interventions if she continues to decline and is in fact approaching end-of-life.  Length of Stay: 2  Current Medications: Scheduled Meds:  . (feeding supplement) PROSource Plus  30 mL Oral BID BM  . feeding supplement  1 Container Oral TID BM  . feeding supplement  237 mL Oral BID BM  . multivitamin with minerals  1 tablet Oral Daily  . pantoprazole (PROTONIX) IV  40 mg Intravenous Q12H  . sodium chloride flush  3 mL Intravenous Q12H    Continuous Infusions: . sodium chloride 50 mL/hr at 06/21/20 1514  . ceFEPime (MAXIPIME) IV 2 g (06/21/20 0919)  . metronidazole 500 mg (06/21/20 2147)    PRN Meds: ondansetron **OR** ondansetron (ZOFRAN) IV  Physical Exam         General: Awake but confused, in no distress HEENT: No bruits, no goiter, no JVD Heart: Regular rate and rhythm. No murmur appreciated. Lungs: Good air movement, clear Abdomen: Soft, nontender, nondistended, positive bowel  sounds.  Ext: Some edema Skin: Warm and dry Neuro: Does not really follow commands but moves 4 extremities  Vital Signs: BP (!) 148/69   Pulse 86   Temp 98 F (36.7 C) (Oral)   Resp 15   Ht 5' (1.524 m)   Wt 59.7 kg   SpO2 100%   BMI 25.70 kg/m  SpO2: SpO2: 100 % O2 Device: O2 Device: Nasal Cannula O2 Flow Rate: O2 Flow Rate (L/min): 2 L/min  Intake/output summary:   Intake/Output Summary (  Last 24 hours) at 06/21/2020 2226 Last data filed at 06/21/2020 2140 Gross per 24 hour  Intake 2283.25 ml  Output --  Net 2283.25 ml   LBM: Last BM Date: 06/21/20 Baseline Weight: Weight: 59 kg Most recent weight: Weight: 59.7 kg       Palliative Assessment/Data:    Flowsheet Rows   Flowsheet Row Most Recent Value  Intake Tab   Referral Department Hospitalist  Unit at Time of Referral ER  Palliative Care Primary Diagnosis Other (Comment)  [GI]  Date Notified 06/19/20  Palliative Care Type New Palliative care  Reason for referral Clarify Goals of Care  Date of Admission 06/19/20  Date first seen by Palliative Care 06/20/20  # of days Palliative referral response time 1 Day(s)  # of days IP prior to Palliative referral 0  Clinical Assessment   Palliative Performance Scale Score 20%  Psychosocial & Spiritual Assessment   Palliative Care Outcomes   Patient/Family meeting held? No      Patient Active Problem List   Diagnosis Date Noted  . Pressure injury of skin 06/21/2020  . Severe sepsis (HCC) 06/19/2020  . Acute blood loss anemia 06/19/2020  . Acute metabolic encephalopathy 06/19/2020  . Gastrointestinal hemorrhage   . Anemia 05/30/2020  . Failure to thrive in adult 05/30/2020  . Acute lower UTI 01/16/2014  . Acute kidney injury (HCC) 01/13/2014  . HTN (hypertension) 01/13/2014  . Leukocytosis, unspecified 01/13/2014  . Unspecified constipation 01/13/2014  . Decubitus ulcer limited to breakdown of skin (stage 2) (HCC) 01/13/2014  . Obesity (BMI 30-39.9)  01/13/2014  . Acute renal failure (HCC) 01/07/2014  . Hyponatremia 01/07/2014  . Edema of both legs 01/07/2014  . Weakness generalized 01/07/2014  . Urinary retention 01/07/2014  . Abdominal pain 01/07/2014    Palliative Care Assessment & Plan   Patient Profile: 87 year old with severe anemia/GI bleed  Recommendations/Plan:  DNR  Continue current interventions. Plan for EGD tomorrow.  Son reports that she would not want overly aggressive interventions if she appears to be approaching end of life.  I am going off service, but will ask another member of PMT to follow up tomorrow.  Code Status:    Code Status Orders  (From admission, onward)         Start     Ordered   06/19/20 1315  Do not attempt resuscitation (DNR)  Continuous       Question Answer Comment  In the event of cardiac or respiratory ARREST Do not call a "code blue"   In the event of cardiac or respiratory ARREST Do not perform Intubation, CPR, defibrillation or ACLS   In the event of cardiac or respiratory ARREST Use medication by any route, position, wound care, and other measures to relive pain and suffering. May use oxygen, suction and manual treatment of airway obstruction as needed for comfort.      06/19/20 1314        Code Status History    Date Active Date Inactive Code Status Order ID Comments User Context   05/30/2020 2350 06/02/2020 1950 Full Code [id number]  Macon Large, MD Inpatient   01/13/2014 1857 01/16/2014 1933 DNR [id number]  Hollice Espy, MD Inpatient   01/07/2014 2210 01/11/2014 1948 Full Code [id number]  Ron Parker, MD Inpatient   Advance Care Planning Activity       Prognosis:   Guarded  Discharge Planning:  To Be Determined  Care plan was discussed with son  Thank  you for allowing the Palliative Medicine Team to assist in the care of this patient.   Total Time 20 Prolonged Time Billed No      Greater than 50%  of this time was spent counseling and  coordinating care related to the above assessment and plan.  Romie Minus, MD  Please contact Palliative Medicine Team phone at [phone] for questions and concerns.

## 2020-06-21 NOTE — Progress Notes (Signed)
Initial Nutrition Assessment  RD working remotely.  DOCUMENTATION CODES:   Not applicable  INTERVENTION:  - continue Boost Breeze TID, each supplement provides 250 kcal and 9 grams of protein. - will order 30 ml Prosource Plus BID, each supplement provides 100 kcal and 15 grams protein.  - will order 1 tablet multivitamin with minerals/day. - complete NFPE when feasible.   NUTRITION DIAGNOSIS:   Increased nutrient needs related to acute illness (sepsis) as evidenced by estimated needs.  GOAL:   Patient will meet greater than or equal to 90% of their needs  MONITOR:   PO intake,Supplement acceptance,Diet advancement,Labs,Weight trends  REASON FOR ASSESSMENT:   Malnutrition Screening Tool  ASSESSMENT:   87 year-old female with medical history of HTN, GERD, chronic lower extremity lymphedema who was hospitalized 1/23-1/26 for GI bleed and was discharged without intervention. Patient has progressive decline in overall health. She presented to the ED with progressive weakness, recurrent GI bleed, AMS, and syncopal episodes at home in the morning. EMS was called and found patient to be hypotensive. CXR was unremarkable, FOBT was positive, CT abdomen showed no acute inflammatory process but noted retained stool in rectosigmoid, and sigmoid diverticulosis. She was admitted for sepsis and GI was consulted.  Patient is currently out of the room to Nuclear Med. Her preferred language is noted to be Hindi. She is noted to be a/o to self only.   Diet advanced from NPO to CLD yesterday at 1005 and will change back to NPO tonight at midnight. No documented intakes while on CLD. Boost Cleotis Nipper has already been ordered TID and she accepted it one of the two times offered to her.   Weight today is 132 lb and PTA the most recently documented weight was on 01/13/14.   Per notes: - severe sepsis with concern for shock - blood loss anemia 2/2 acute GIB--NPO for possible intervention - AKI on stage 3  CKD - AMS thought to be acute toxic metabolic encephalopathy and possible hypoperfusion   Labs reviewed; Cl: 114 mmol/l, BUN: 76 mg/dl, creatinine: 7.34 mg/dl, Ca: 8.2 mg/dl, GFR: 27 ml/min.  Medications reviewed; 40 mg IV protonix BID. IVF; NS @ 50 ml/hr.     NUTRITION - FOCUSED PHYSICAL EXAM:  unable to complete at this time.   Diet Order:   Diet Order            Diet NPO time specified  Diet effective midnight           Diet clear liquid Room service appropriate? Yes with Assist; Fluid consistency: Thin  Diet effective now                 EDUCATION NEEDS:   Not appropriate for education at this time  Skin:  Skin Assessment: Skin Integrity Issues: Skin Integrity Issues:: Stage I Stage I: sacrum  Last BM:  2/14 (type 6 x1, type 7 x2)  Height:   Ht Readings from Last 1 Encounters:  06/19/20 5' (1.524 m)    Weight:   Wt Readings from Last 1 Encounters:  06/21/20 59.7 kg    Estimated Nutritional Needs:  Kcal:  1500-1750 kcal Protein:  70-85 gram Fluid:  >/= 1.8 L/day      Trenton Gammon, MS, RD, LDN, CNSC Inpatient Clinical Dietitian RD pager # available in [facility]  After hours/weekend pager # available in [facility]

## 2020-06-21 NOTE — Progress Notes (Addendum)
Nuclear bleeding scan negative for active bleeding. Discussed with Dr. Ewing Schlein who recommends proceeding with EGD tomorrow for further evaluation.  I thoroughly discussed the procedure with the patient's son, Elizabeth Jarvis, to include nature, alternatives (PPI/observation), benefits, and risks (including but not limited to bleeding, infection, perforation, and increased risk of anesthesia/cardiac and pulmonary complications, given patient's age).  Patient's son verbalized understanding and gave verbal consent to proceed with EGD.  Patient's son states patient requests something to eat.  Given current stability and negative bleeding scan, OK for patient to have soft dinner with NPO at midnight.  [facility] will follow.

## 2020-06-21 NOTE — Progress Notes (Signed)
PROGRESS NOTE    Elizabeth Jarvis  WUJ:[id number] DOB: 08/28/1921 DOA: 06/19/2020 PCP: Pcp, No   Brief Narrative:  This 87 years old female with PMH significant for hypertension, GERD, chronic lower extremity lymphedema who was recently hospitalized from 1/23-1/26 for GI bleed and was discharged without intervention. Patient was medically managed during previous hospitalization and was discharged on Protonix and was recommended outpatient follow-up.  Patient has progressive decline in overall health. She presented in the ED with progressive weakness, recurrent GI bleed, altered mentation and syncopal episodes at home in the morning.    Per EMS she was found to be hypotensive with tachycardia and afebrile. In ED  Lactic acid 5.1, serum creatinine 2.12, WBC 19.6, chest x-ray unremarkable, FOBT positive, CT abdomen: no acute inflammatory process, retained stool in the rectosigmoid, sigmoid diverticulosis.  Patient is admitted for sepsis, started on IV antibiotics, patient was given  IV fluids. GI consulted, recommended RBC scan . Patient was given 2 units of packed red blood cells.  Assessment & Plan:   Principal Problem:   Severe sepsis (HCC) Active Problems:   Weakness generalized   Acute kidney injury (HCC)   Anemia   Gastrointestinal hemorrhage   Acute blood loss anemia   Acute metabolic encephalopathy   Pressure injury of skin   Severe sepsis, concern for developing shock: She was found septic on admission,  sepsis criteria: hypothermia, tachycardia, leukocytosis, lactic acidosis with AKI and soft blood pressures. Concern for infectious and/or hypovolemia due to blood loss. Continue current antibiotics, metronidazole and cefepime to cover for abdominal infection. CT abdomen: no acute inflammatory process, retained stool in the rectosigmoid, sigmoid diverticulosis Blood culture: No growth so far. Continue IV fluids, trend lactic acid. Consulted palliative care to discuss goals of  care.  Blood loss anemia secondary to Acute GI bleed: Hb 10.2-> 6.3 in 2 weeks and recently hospitalized for GI bleed without endoscopic intervention. BUN 95> 76. Son is okay with the doing endoscopy if it is needed. Gastroenterology consulted,  states patient hemoglobin is stable.  if she bleeds will consider colonoscopy. Patient received 2 units of packed red blood cells,  posttransfusion hemoglobin is 8.6. Continue Protonix 40 mg IV twice daily, continue H&H every 6 hours. Continue n.p.o. for possible intervention. GI recommended RBC scan to locate bleeding if is positive,  consider IR for embolization. Monitor H&H every 6 hours.  Syncope, likely vasovagal versus orthostatic in the setting of anemia Occurred during BM at home and lasted a few seconds. Continue telemetry  AKI on CKD 3b: Baseline creatinine 1.1, creatinine 1.76 today Continue IV fluids and blood transfusion  Failure to thrive, multifactorial: anemia, AKI, sepsis, debility  Elevated troponin, suspect secondary to demand ischemia Troponins 40 x 2 flat. Patient denies any chest pain. Consider cardiology if  troponin trending up.  Altered mental status, suspect acute toxic metabolic encephalopathy and possibly hypoperfusion: Continue treatment as above.  Goals of Care: Discussed with son in detail.  Explained to him detail about overall prognosis.  He wants patient to have RBC scan and IR embolization if needed.  He is not agreeable to invasive procedure.  He is agreeable to blood transfusion.  Also spoke with patient's primary care physician Dr. Georgiann MohsAmit Angell in [place].  He agreed with the above plan.   DVT prophylaxis: SCDs Code Status: DNR Family Communication:  No family at bed side. Disposition Plan:   Status is: Inpatient  Remains inpatient appropriate because:Inpatient level of care appropriate due to severity of illness  Dispo: The patient is from: Home              Anticipated d/c is to: SNF               Anticipated d/c date is: 3 days              Patient currently is not medically stable to d/c.   Difficult to place patient No   Consultants:   Gastroenterology  Procedures:  Antimicrobials:  Anti-infectives (From admission, onward)   Start     Dose/Rate Route Frequency Ordered Stop   06/20/20 1000  ceFEPIme (MAXIPIME) 2 g in sodium chloride 0.9 % 100 mL IVPB        2 g 200 mL/hr over 30 Minutes Intravenous Every 24 hours 06/19/20 1248     06/19/20 1315  ceFEPIme (MAXIPIME) 2 g in sodium chloride 0.9 % 100 mL IVPB  Status:  Discontinued        2 g 200 mL/hr over 30 Minutes Intravenous  Once 06/19/20 1314 06/19/20 1323   06/19/20 1315  metroNIDAZOLE (FLAGYL) IVPB 500 mg        500 mg 100 mL/hr over 60 Minutes Intravenous Every 8 hours 06/19/20 1314     06/19/20 1100  ceFEPIme (MAXIPIME) 2 g in sodium chloride 0.9 % 100 mL IVPB        2 g 200 mL/hr over 30 Minutes Intravenous  Once 06/19/20 1056 06/19/20 1311   06/19/20 1100  metroNIDAZOLE (FLAGYL) IVPB 500 mg        500 mg 100 mL/hr over 60 Minutes Intravenous  Once 06/19/20 1056 06/19/20 1321     Subjective: Patient was seen and examined at bedside.  Overnight events noted.    Her Hb dropped, but no visible bleeding, getting transfusion at bed side. Patient seems confused but alert and arousable.   She denies any chest pain or shortness of breath.  Objective: Vitals:   06/21/20 0630 06/21/20 0649 06/21/20 1015 06/21/20 1047  BP:  127/71 132/64 138/63  Pulse:  (!) 108 93 93  Resp:  17 18 19   Temp: 97.7 F (36.5 C) 97.7 F (36.5 C) 97.7 F (36.5 C) (!) 97.2 F (36.2 C)  TempSrc: Oral Oral Oral Oral  SpO2:  100% 100%   Weight:      Height:        Intake/Output Summary (Last 24 hours) at 06/21/2020 1301 Last data filed at 06/21/2020 1026 Gross per 24 hour  Intake 1496 ml  Output --  Net 1496 ml   Filed Weights   06/19/20 1225 06/21/20 0500  Weight: 59 kg 59.7 kg    Examination:  General exam:  Appears calm and comfortable, not in any distress. Respiratory system: Clear to auscultation. Respiratory effort normal. Cardiovascular system: S1 & S2 heard, RRR. No JVD, murmurs, rubs, gallops or clicks.  No pedal edema. Gastrointestinal system: Abdomen is nondistended, soft and nontender. No organomegaly or masses felt. Normal bowel sounds heard. Central nervous system: Alert and oriented. No focal neurological deficits. Extremities: Symmetric 5 x 5 power. No edema, no cyanosis, no clubbing. Skin: No rashes, lesions or ulcers Psychiatry: Judgement and insight appear normal. Mood & affect appropriate.     Data Reviewed: I have personally reviewed following labs and imaging studies  CBC: Recent Labs  Lab 06/19/20 0929 06/19/20 1315 06/19/20 1915 06/20/20 0056 06/20/20 0323 06/20/20 1725 06/20/20 2250  WBC 19.6*  --   --   --  16.1*  --   --  NEUTROABS 15.8*  --   --   --   --   --   --   HGB 6.3*   < > 3.9* 9.8* 8.6* 7.9* 6.7*  HCT 19.8*   < > 12.9* 30.4* 24.9* 22.9* 20.8*  MCV 90.8  --   --   --  86.8  --   --   PLT 328  --   --   --  162  --   --    < > = values in this interval not displayed.   Basic Metabolic Panel: Recent Labs  Lab 06/19/20 0929 06/19/20 1315 06/20/20 0323  NA 135  --  139  K 4.5  --  4.3  CL 105  --  114*  CO2 15*  --  17*  GLUCOSE 199*  --  96  BUN 95*  --  76*  CREATININE 2.12*  --  1.74*  CALCIUM 9.0  --  8.2*  MG  --  2.2  --    GFR: Estimated Creatinine Clearance: 15.3 mL/min (A) (by C-G formula based on SCr of 1.74 mg/dL (H)). Liver Function Tests: Recent Labs  Lab 06/19/20 0929  AST 21  ALT 15  ALKPHOS 47  BILITOT 0.7  PROT 6.5  ALBUMIN 3.7   No results for input(s): LIPASE, AMYLASE in the last 168 hours. No results for input(s): AMMONIA in the last 168 hours. Coagulation Profile: Recent Labs  Lab 06/19/20 0929 06/20/20 0323  INR 1.2 1.3*   Cardiac Enzymes: No results for input(s): CKTOTAL, CKMB, CKMBINDEX,  TROPONINI in the last 168 hours. BNP (last 3 results) No results for input(s): PROBNP in the last 8760 hours. HbA1C: No results for input(s): HGBA1C in the last 72 hours. CBG: Recent Labs  Lab 06/19/20 1006  GLUCAP 146*   Lipid Profile: No results for input(s): CHOL, HDL, LDLCALC, TRIG, CHOLHDL, LDLDIRECT in the last 72 hours. Thyroid Function Tests: No results for input(s): TSH, T4TOTAL, FREET4, T3FREE, THYROIDAB in the last 72 hours. Anemia Panel: No results for input(s): VITAMINB12, FOLATE, FERRITIN, TIBC, IRON, RETICCTPCT in the last 72 hours. Sepsis Labs: Recent Labs  Lab 06/19/20 9937 06/19/20 1148 06/20/20 0323  PROCALCITON  --   --  <0.10  LATICACIDVEN 5.1* 4.0*  --     Recent Results (from the past 240 hour(s))  Urine culture     Status: Abnormal (Preliminary result)   Collection Time: 06/19/20  9:29 AM   Specimen: In/Out Cath Urine  Result Value Ref Range Status   Specimen Description   Final    IN/OUT CATH URINE Performed at [facility], [address], [place], [place] [address]    Special Requests   Final    NONE Performed at [facility], [address], [place], [place] [address]    Culture (A)  Final    80,000 COLONIES/mL ENTEROCOCCUS FAECIUM SUSCEPTIBILITIES TO FOLLOW Performed at [facility], [address], [place], [place] [address]    Report Status PENDING  Incomplete  Resp Panel by RT-PCR (Flu A&B, Covid) Nasopharyngeal Swab     Status: None   Collection Time: 06/19/20  9:31 AM   Specimen: Nasopharyngeal Swab; Nasopharyngeal(NP) swabs in vial transport medium  Result Value Ref Range Status   SARS Coronavirus 2 by RT PCR NEGATIVE NEGATIVE Final    Comment: (NOTE) SARS-CoV-2 target nucleic acids are NOT DETECTED.  The SARS-CoV-2 RNA is generally detectable in upper respiratory specimens during the acute phase of  infection. The lowest concentration of SARS-CoV-2 viral copies this assay can detect  is 138 copies/mL. A negative result does not preclude SARS-Cov-2 infection and should not be used as the sole basis for treatment or other patient management decisions. A negative result may occur with  improper specimen collection/handling, submission of specimen other than nasopharyngeal swab, presence of viral mutation(s) within the areas targeted by this assay, and inadequate number of viral copies(<138 copies/mL). A negative result must be combined with clinical observations, patient history, and epidemiological information. The expected result is Negative.  Fact Sheet for Patients:  BloggerCourse.com  Fact Sheet for Healthcare Providers:  SeriousBroker.it  This test is no t yet approved or cleared by the [place] FDA and  has been authorized for detection and/or diagnosis of SARS-CoV-2 by FDA under an Emergency Use Authorization (EUA). This EUA will remain  in effect (meaning this test can be used) for the duration of the COVID-19 declaration under Section 564(b)(1) of the Act, 21 U.S.C.section 360bbb-3(b)(1), unless the authorization is terminated  or revoked sooner.       Influenza A by PCR NEGATIVE NEGATIVE Final   Influenza B by PCR NEGATIVE NEGATIVE Final    Comment: (NOTE) The Xpert Xpress SARS-CoV-2/FLU/RSV plus assay is intended as an aid in the diagnosis of influenza from Nasopharyngeal swab specimens and should not be used as a sole basis for treatment. Nasal washings and aspirates are unacceptable for Xpert Xpress SARS-CoV-2/FLU/RSV testing.  Fact Sheet for Patients: BloggerCourse.com  Fact Sheet for Healthcare Providers: SeriousBroker.it  This test is not yet approved or cleared by the [place] FDA and has been authorized for detection and/or diagnosis of SARS-CoV-2 by FDA under an Emergency Use Authorization (EUA). This EUA will remain in effect  (meaning this test can be used) for the duration of the COVID-19 declaration under Section 564(b)(1) of the Act, 21 U.S.C. section 360bbb-3(b)(1), unless the authorization is terminated or revoked.  Performed at [facility], [address] [place], [place] [address]   Blood Culture (routine x 2)     Status: None (Preliminary result)   Collection Time: 06/19/20 10:16 AM   Specimen: BLOOD  Result Value Ref Range Status   Specimen Description   Final    BLOOD BLOOD RIGHT HAND Performed at [facility], [address] [place], [place] [address]    Special Requests   Final    BOTTLES DRAWN AEROBIC ONLY Blood Culture results may not be optimal due to an inadequate volume of blood received in culture bottles Performed at [facility], [address], [place], [place] [address]    Culture   Final    NO GROWTH 1 DAY Performed at [facility], [address] [place], [place] [address]    Report Status PENDING  Incomplete  Blood Culture (routine x 2)     Status: None (Preliminary result)   Collection Time: 06/19/20 11:48 AM   Specimen: BLOOD  Result Value Ref Range Status   Specimen Description   Final    BLOOD LEFT ANTECUBITAL Performed at [facility], [address] [place], [place] [address]    Special Requests   Final    BOTTLES DRAWN AEROBIC AND ANAEROBIC Blood Culture adequate volume Performed at [facility], [address] [place], [place] [address]    Culture   Final    NO GROWTH 1 DAY Performed at [facility], [address]  [place], [place] [address]    Report Status PENDING  Incomplete     Radiology Studies: No results found.  Scheduled Meds: . feeding supplement  1 Container Oral TID BM  . feeding supplement  237 mL Oral BID BM  . pantoprazole (PROTONIX) IV  40 mg Intravenous Q12H  . sodium chloride flush  3 mL Intravenous Q12H   Continuous  Infusions: . sodium chloride Stopped (06/21/20 1026)  . ceFEPime (MAXIPIME) IV 2 g (06/21/20 0919)  . metronidazole 500 mg (06/21/20 0811)     LOS: 2 days    Time spent: 25 mins    Magally Vahle, MD [facility]   If 7PM-7AM, please contact night-coverage

## 2020-06-21 NOTE — Progress Notes (Signed)
[facility] Gastroenterology Progress Note  Elizabeth Jarvis 87 y.o. [birth year]  CC:  Anemia, rectal bleeding   Subjective:  When asked how patient was feeling today, she shook her head.  When asked if she had any pain, she points to her abdomen.  She is very lethargic and does not otherwise interact with interview.  Patient son at bedside and states patient is much more lethargic than prior to admission.  Patient was H. pylori (serology) positive and was discharged on twice daily Protonix, and I added triple therapy for H. pylori.  However, unfortunately, patient's son states that patient has PCP advised him to stop treatment for H pylori, as well as twice daily PPI, due to patient's constipation.  Patient son reports that patient has constipation and he tried MiraLAX, but constipation persisted.  He states that patient had 1 day of melenic, tarry stools, but did not have any hematochezia at home.  However, since admission, patient has been having ongoing hematochezia.  Patient's son states patient had red blood pooling around her last night.  ROS : ROS limited by patient lethargy.  Review of Systems  Constitutional: Positive for malaise/fatigue.  Gastrointestinal: Positive for abdominal pain and blood in stool.   Objective: Vital signs in last 24 hours: Vitals:   06/21/20 0630 06/21/20 0649  BP:  127/71  Pulse:  (!) 108  Resp:  17  Temp: 97.7 F (36.5 C) 97.7 F (36.5 C)  SpO2:  100%    Physical Exam:  General:  Lethargic, elderly, no distress; son at bedside  Head:  Normocephalic, without obvious abnormality, atraumatic  Eyes:  Conjunctival pallor, EOMs intact  Lungs:   Respirations unlabored, breathing comfortably on room air  Heart:  Regular rate and rhythm, S1, S2 normal  Abdomen:   Soft, non-tender, non-distended.  No guarding or peritoneal signs.  Extremities: 3+ lower extremity edema bilaterally  Pulses: 2+ and symmetric    Lab Results: Recent Labs    06/19/20 0929  06/19/20 1315 06/20/20 0323  NA 135  --  139  K 4.5  --  4.3  CL 105  --  114*  CO2 15*  --  17*  GLUCOSE 199*  --  96  BUN 95*  --  76*  CREATININE 2.12*  --  1.74*  CALCIUM 9.0  --  8.2*  MG  --  2.2  --    Recent Labs    06/19/20 0929  AST 21  ALT 15  ALKPHOS 47  BILITOT 0.7  PROT 6.5  ALBUMIN 3.7   Recent Labs    06/19/20 0929 06/19/20 1315 06/20/20 0323 06/20/20 1725 06/20/20 2250  WBC 19.6*  --  16.1*  --   --   NEUTROABS 15.8*  --   --   --   --   HGB 6.3*   < > 8.6* 7.9* 6.7*  HCT 19.8*   < > 24.9* 22.9* 20.8*  MCV 90.8  --  86.8  --   --   PLT 328  --  162  --   --    < > = values in this interval not displayed.   Recent Labs    06/19/20 0929 06/20/20 0323  LABPROT 15.0 15.8*  INR 1.2 1.3*     Assessment: Hematochezia, suspected lower GI source (diverticular), but brisk upper GI bleed not completely ruled out, given melena and hemodynamic instability at time of arrival.   -Hgb 6.7, decreased from 7.9 overnight.  Hgb as low was 3.9 on  2/12 -NON-contrast CT 2/12: No acute or inflammatory process identified in the non-contrast abdomen or pelvis. Abundant retained stool in the rectosigmoid colon. Sigmoid diverticulosis without active inflammation. -BP 110s/70s with HR 90s  Shock, presumably from GI blood loss  AKI:BUN 76/ Cr 1.74 as of 2/14, improved from Cr 95/ BUN 2.12 on arrival  Assessment: Discussed with son at bedside.  He does not think patient will be able to tolerate colonoscopy prep, and I am in agreement, as patient has only been able to tolerate sips of clears and is very lethargic.  Recommend nuclear bleeding scan today.  If positive for lower GI source, recommend IR consultation for consideration of embolization.  If positive for upper GI source, consider EGD versus IR consultation (depending on clinical stability).  Patient son is amenable to both IR intervention and EGD if needed.  Continue Protonix 40 mg IV twice daily.   Continue  to monitor H&H with transfusion as needed to maintain hemoglobin greater than 7.  [facility] will follow.  Edrick Kins PA-C 06/21/2020, 8:50 AM  Contact #  [phone]

## 2020-06-21 NOTE — Plan of Care (Signed)

## 2020-06-22 ENCOUNTER — Encounter (HOSPITAL_COMMUNITY): Payer: Self-pay | Admitting: Family Medicine

## 2020-06-22 ENCOUNTER — Encounter (HOSPITAL_COMMUNITY)
Admission: EM | Disposition: A | Discharge status: Skilled Nursing Facility | Payer: Self-pay | Source: Home / Self Care | Attending: Family Medicine

## 2020-06-22 ENCOUNTER — Inpatient Hospital Stay (HOSPITAL_COMMUNITY): Payer: Medicare Other | Admitting: Anesthesiology

## 2020-06-22 DIAGNOSIS — Z7189 Other specified counseling: Secondary | ICD-10-CM | POA: Diagnosis not present

## 2020-06-22 DIAGNOSIS — R6520 Severe sepsis without septic shock: Secondary | ICD-10-CM | POA: Diagnosis not present

## 2020-06-22 DIAGNOSIS — Z515 Encounter for palliative care: Secondary | ICD-10-CM | POA: Diagnosis not present

## 2020-06-22 DIAGNOSIS — A419 Sepsis, unspecified organism: Secondary | ICD-10-CM | POA: Diagnosis not present

## 2020-06-22 DIAGNOSIS — R531 Weakness: Secondary | ICD-10-CM | POA: Diagnosis not present

## 2020-06-22 HISTORY — PX: BIOPSY: SHX5522

## 2020-06-22 HISTORY — PX: ESOPHAGOGASTRODUODENOSCOPY: SHX5428

## 2020-06-22 LAB — TYPE AND SCREEN
ABO/RH(D): B NEG
Antibody Screen: NEGATIVE
Unit division: 0
Unit division: 0
Unit division: 0
Unit division: 0

## 2020-06-22 LAB — HEMOGLOBIN AND HEMATOCRIT, BLOOD
HCT: 32.8 % — ABNORMAL LOW (ref 36.0–46.0)
HCT: 36 % (ref 36.0–46.0)
HCT: 34.5 % — ABNORMAL LOW (ref 36.0–46.0)
HCT: 33.9 % — ABNORMAL LOW (ref 36.0–46.0)
HCT: 31.6 % — ABNORMAL LOW (ref 36.0–46.0)
Hemoglobin: 10.9 g/dL — ABNORMAL LOW (ref 12.0–15.0)
Hemoglobin: 12.1 g/dL (ref 12.0–15.0)
Hemoglobin: 11.5 g/dL — ABNORMAL LOW (ref 12.0–15.0)
Hemoglobin: 11.4 g/dL — ABNORMAL LOW (ref 12.0–15.0)
Hemoglobin: 10.5 g/dL — ABNORMAL LOW (ref 12.0–15.0)

## 2020-06-22 LAB — CBC
HCT: 32.8 % — ABNORMAL LOW (ref 36.0–46.0)
HCT: 33.6 % — ABNORMAL LOW (ref 36.0–46.0)
Hemoglobin: 11.2 g/dL — ABNORMAL LOW (ref 12.0–15.0)
Hemoglobin: 11.3 g/dL — ABNORMAL LOW (ref 12.0–15.0)
MCH: 29.7 pg (ref 26.0–34.0)
MCH: 30.2 pg (ref 26.0–34.0)
MCHC: 33.6 g/dL (ref 30.0–36.0)
MCHC: 34.1 g/dL (ref 30.0–36.0)
MCV: 87 fL (ref 80.0–100.0)
MCV: 89.8 fL (ref 80.0–100.0)
Platelets: 126 10*3/uL — ABNORMAL LOW (ref 150–400)
Platelets: 132 10*3/uL — ABNORMAL LOW (ref 150–400)
RBC: 3.74 MIL/uL — ABNORMAL LOW (ref 3.87–5.11)
RBC: 3.77 MIL/uL — ABNORMAL LOW (ref 3.87–5.11)
RDW: 16.2 % — ABNORMAL HIGH (ref 11.5–15.5)
RDW: 16.5 % — ABNORMAL HIGH (ref 11.5–15.5)
WBC: 17.6 10*3/uL — ABNORMAL HIGH (ref 4.0–10.5)
WBC: 17.6 10*3/uL — ABNORMAL HIGH (ref 4.0–10.5)
nRBC: 1.3 % — ABNORMAL HIGH (ref 0.0–0.2)
nRBC: 1.3 % — ABNORMAL HIGH (ref 0.0–0.2)

## 2020-06-22 LAB — BPAM RBC
Blood Product Expiration Date: 202203032359
Blood Product Expiration Date: 202203032359
Blood Product Expiration Date: 202203052359
Blood Product Expiration Date: 202203232359
ISSUE DATE / TIME: 202202121333
ISSUE DATE / TIME: 202202121944
ISSUE DATE / TIME: 202202140307
ISSUE DATE / TIME: 202202141027
Unit Type and Rh: 1700
Unit Type and Rh: 1700
Unit Type and Rh: 1700
Unit Type and Rh: 1700

## 2020-06-22 LAB — BASIC METABOLIC PANEL
Anion gap: 8 (ref 5–15)
Anion gap: 9 (ref 5–15)
BUN: 46 mg/dL — ABNORMAL HIGH (ref 8–23)
BUN: 44 mg/dL — ABNORMAL HIGH (ref 8–23)
CO2: 15 mmol/L — ABNORMAL LOW (ref 22–32)
CO2: 16 mmol/L — ABNORMAL LOW (ref 22–32)
Calcium: 8.1 mg/dL — ABNORMAL LOW (ref 8.9–10.3)
Calcium: 8.2 mg/dL — ABNORMAL LOW (ref 8.9–10.3)
Chloride: 120 mmol/L — ABNORMAL HIGH (ref 98–111)
Chloride: 120 mmol/L — ABNORMAL HIGH (ref 98–111)
Creatinine, Ser: 1.5 mg/dL — ABNORMAL HIGH (ref 0.44–1.00)
Creatinine, Ser: 1.45 mg/dL — ABNORMAL HIGH (ref 0.44–1.00)
GFR, Estimated: 32 mL/min — ABNORMAL LOW (ref >60)
GFR, Estimated: 33 mL/min — ABNORMAL LOW (ref >60)
Glucose, Bld: 88 mg/dL (ref 70–99)
Glucose, Bld: 86 mg/dL (ref 70–99)
Potassium: 3.9 mmol/L (ref 3.5–5.1)
Potassium: 3.2 mmol/L — ABNORMAL LOW (ref 3.5–5.1)
Sodium: 143 mmol/L (ref 135–145)
Sodium: 145 mmol/L (ref 135–145)

## 2020-06-22 LAB — URINE CULTURE: Culture: 80000 — AB

## 2020-06-22 LAB — PHOSPHORUS: Phosphorus: 2.5 mg/dL (ref 2.5–4.6)

## 2020-06-22 LAB — MAGNESIUM: Magnesium: 1.7 mg/dL (ref 1.7–2.4)

## 2020-06-22 LAB — LACTIC ACID, PLASMA: Lactic Acid, Venous: 1.3 mmol/L (ref 0.5–1.9)

## 2020-06-22 SURGERY — EGD (ESOPHAGOGASTRODUODENOSCOPY)
Anesthesia: Monitor Anesthesia Care

## 2020-06-22 MED ORDER — PHENYLEPHRINE 40 MCG/ML (10ML) SYRINGE FOR IV PUSH (FOR BLOOD PRESSURE SUPPORT)
PREFILLED_SYRINGE | INTRAVENOUS | Status: DC | PRN
  Administered 2020-06-22 (×5): 80 ug via INTRAVENOUS

## 2020-06-22 MED ORDER — SODIUM CHLORIDE 0.9 % IV SOLN: INTRAVENOUS | Status: DC

## 2020-06-22 MED ORDER — POTASSIUM CHLORIDE 10 MEQ/100ML IV SOLN
10.0000 meq | INTRAVENOUS | Status: AC
  Administered 2020-06-22 (×2): 10 meq via INTRAVENOUS
  Filled 2020-06-22 (×2): qty 100

## 2020-06-22 MED ORDER — PROPOFOL 10 MG/ML IV BOLUS
INTRAVENOUS | Status: AC
  Filled 2020-06-22: qty 20

## 2020-06-22 MED ORDER — SODIUM CHLORIDE 0.9 % IV SOLN
3.0000 g | Freq: Two times a day (BID) | INTRAVENOUS | Status: DC
  Administered 2020-06-22 – 2020-06-26 (×10): 3 g via INTRAVENOUS
  Filled 2020-06-22: qty 3
  Filled 2020-06-22: qty 2.1
  Filled 2020-06-22 (×2): qty 8
  Filled 2020-06-22: qty 2.1
  Filled 2020-06-22 (×2): qty 8
  Filled 2020-06-22: qty 3
  Filled 2020-06-22 (×2): qty 8
  Filled 2020-06-22: qty 2.1

## 2020-06-22 MED ORDER — PROPOFOL 10 MG/ML IV BOLUS
INTRAVENOUS | Status: DC | PRN
  Administered 2020-06-22 (×3): 10 mg via INTRAVENOUS
  Administered 2020-06-22: 20 mg via INTRAVENOUS

## 2020-06-22 NOTE — Progress Notes (Addendum)
PROGRESS NOTE    Elizabeth Jarvis  TGG:[id number] DOB: 05-Jul-1921 DOA: 06/19/2020 PCP: Pcp, No   Brief Narrative:  This 87 years old female with PMH significant for hypertension, GERD, chronic lower extremity lymphedema who was recently hospitalized from 1/23-1/26 for GI bleed and was discharged without intervention. Patient was medically managed during previous hospitalization and was discharged on Protonix and was recommended outpatient follow-up.  Patient has progressive decline in overall health. She presented in the ED with progressive weakness, recurrent GI bleed, altered mentation and syncopal episodes at home in the morning.  Per EMS she was found to be hypotensive with tachycardia and was afebrile. In ED  Lactic acid 5.1, serum creatinine 2.12, WBC 19.6, chest x-ray unremarkable, FOBT positive, CT abdomen: no acute inflammatory process, retained stool in the rectosigmoid, sigmoid diverticulosis.  Patient is admitted for sepsis, started on IV antibiotics, patient was given IV fluids. GI consulted, recommended RBC scan which was negative, underwent EGD which was unremarkable. Patient was given 2 units of packed red blood cells.  Assessment & Plan:   Principal Problem:   Severe sepsis (HCC) Active Problems:   Weakness generalized   Acute kidney injury (HCC)   Anemia   Gastrointestinal hemorrhage   Acute blood loss anemia   Acute metabolic encephalopathy   Pressure injury of skin   Severe sepsis, concern for developing shock: She was found septic on admission,  sepsis criteria: hypothermia, tachycardia, leukocytosis, lactic acidosis with AKI and soft blood pressures. Concern for infectious and/or hypovolemia due to blood loss. Started on emperic antibiotics, metronidazole and cefepime to cover for abdominal infection. CT abdomen: no acute inflammatory process, retained stool in the rectosigmoid, sigmoid diverticulosis Blood culture: No growth so far. Urine culture grew Enterococcus  faecalis Antibiotics changed to Unasyn , can be changed to Augmentin at discharge. Continue IV fluids,  Lactic acid normalized 1.3 Consulted palliative care to discuss goals of care. Sepsis physiology resolved.  Blood loss anemia secondary to Acute GI bleed: Hb 10.2-> 6.3 in 2 weeks and recently hospitalized for GI bleed without endoscopic intervention. Son is okay with the doing endoscopy if it is needed. Gastroenterology consulted,  states patient hemoglobin is stable.  if she bleeds will consider colonoscopy. Patient received 2 units of packed red blood cells,  posttransfusion hemoglobin is 8.6. Continue Protonix 40 mg IV twice daily, continue H&H every 6 hours. Continue n.p.o. for possible intervention. GI recommended RBC scan to locate bleeding if is positive,  consider IR for embolization. RBC scan negative.  Patient underwent EGD which was also unremarkable. GI recommended lifelong PPI, hemoglobin remained stable.  12.4.   Syncope, likely vasovagal versus orthostatic in the setting of anemia Occurred during BM at home and lasted few seconds. Continue telemetry  AKI on CKD 3b: >> Improving Baseline creatinine 1.1, creatinine 1.45 today Continue IV fluids .  Failure to thrive, multifactorial: anemia, AKI, sepsis, debility  Elevated troponin: Troponins 40 x 2 flat. suspect secondary to demand ischemia Patient denies any chest pain. Consider cardiology if  troponin trending up.  Altered mental status, suspect acute toxic metabolic encephalopathy and possibly hypoperfusion: Continue treatment as above.  Goals of Care: Discussed with son in detail.  Explained to him detail about overall prognosis.    He is agreeable to EGD.  Also spoke with patient's primary care physician Dr. Georgiann Mohs in [place].  He agreed with the above plan.   DVT prophylaxis: SCDs Code Status: DNR Family Communication:  Spoke with son Sandeep at be side.  Disposition Plan:   Status is:  Inpatient  Remains inpatient appropriate because:Inpatient level of care appropriate due to severity of illness   Dispo: The patient is from: Home              Anticipated d/c is to: SNF              Anticipated d/c date is: 1-2 days              Patient currently is not medically stable to d/c.   Difficult to place patient No   Consultants:   Gastroenterology  Procedures:  Antimicrobials:  Anti-infectives (From admission, onward)   Start     Dose/Rate Route Frequency Ordered Stop   06/22/20 0900  Ampicillin-Sulbactam (UNASYN) 3 g in sodium chloride 0.9 % 100 mL IVPB        3 g 200 mL/hr over 30 Minutes Intravenous Every 12 hours 06/22/20 0832     06/20/20 1000  ceFEPIme (MAXIPIME) 2 g in sodium chloride 0.9 % 100 mL IVPB  Status:  Discontinued        2 g 200 mL/hr over 30 Minutes Intravenous Every 24 hours 06/19/20 1248 06/22/20 0832   06/19/20 1315  ceFEPIme (MAXIPIME) 2 g in sodium chloride 0.9 % 100 mL IVPB  Status:  Discontinued        2 g 200 mL/hr over 30 Minutes Intravenous  Once 06/19/20 1314 06/19/20 1323   06/19/20 1315  metroNIDAZOLE (FLAGYL) IVPB 500 mg  Status:  Discontinued        500 mg 100 mL/hr over 60 Minutes Intravenous Every 8 hours 06/19/20 1314 06/22/20 0832   06/19/20 1100  ceFEPIme (MAXIPIME) 2 g in sodium chloride 0.9 % 100 mL IVPB        2 g 200 mL/hr over 30 Minutes Intravenous  Once 06/19/20 1056 06/19/20 1311   06/19/20 1100  metroNIDAZOLE (FLAGYL) IVPB 500 mg        500 mg 100 mL/hr over 60 Minutes Intravenous  Once 06/19/20 1056 06/19/20 1321     Subjective: Patient was seen and examined at bedside.  Overnight events noted.    Patient seems confused but alert and arousable.   She denies any chest pain or shortness of breath.  Objective: Vitals:   06/22/20 1230 06/22/20 1234 06/22/20 1243 06/22/20 1318  BP: 104/62  126/71 137/76  Pulse: 80  82   Resp: 15  14   Temp:  97.6 F (36.4 C)  (!) 97.4 F (36.3 C)  TempSrc:  Oral  Axillary   SpO2: 100%  100%   Weight:      Height:        Intake/Output Summary (Last 24 hours) at 06/22/2020 1410 Last data filed at 06/22/2020 1358 Gross per 24 hour  Intake 1724.81 ml  Output --  Net 1724.81 ml   Filed Weights   06/19/20 1225 06/21/20 0500 06/22/20 0500  Weight: 59 kg 59.7 kg 59.4 kg    Examination:  General exam: Appears calm and comfortable, not in any distress. Respiratory system: Clear to auscultation. Respiratory effort normal. Cardiovascular system: S1 & S2 heard, RRR. No JVD, murmurs, rubs, gallops or clicks.  No pedal edema. Gastrointestinal system: Abdomen is nondistended, soft and nontender. No organomegaly or masses felt. Normal bowel sounds heard. Central nervous system: Alert and oriented. No focal neurological deficits. Extremities: Symmetric 5 x 5 power. No edema, no cyanosis, no clubbing. Skin: No rashes, lesions or ulcers Psychiatry: Judgement and insight appear  normal. Mood & affect appropriate.     Data Reviewed: I have personally reviewed following labs and imaging studies  CBC: Recent Labs  Lab 06/19/20 0929 06/19/20 1315 06/20/20 0323 06/20/20 1725 06/20/20 2250 06/21/20 2335 06/22/20 0309 06/22/20 0657 06/22/20 1052  WBC 19.6*  --  16.1*  --   --  17.6* 17.6*  --   --   NEUTROABS 15.8*  --   --   --   --   --   --   --   --   HGB 6.3*   < > 8.6*   < > 6.7* 11.2* 11.3* 10.9* 12.1  HCT 19.8*   < > 24.9*   < > 20.8* 32.8* 33.6* 32.8* 36.0  MCV 90.8  --  86.8  --   --  87.0 89.8  --   --   PLT 328  --  162  --   --  126* 132*  --   --    < > = values in this interval not displayed.   Basic Metabolic Panel: Recent Labs  Lab 06/19/20 0929 06/19/20 1315 06/20/20 0323 06/21/20 2335 06/22/20 0309  NA 135  --  139 143 145  K 4.5  --  4.3 3.9 3.2*  CL 105  --  114* 120* 120*  CO2 15*  --  17* 15* 16*  GLUCOSE 199*  --  96 88 86  BUN 95*  --  76* 46* 44*  CREATININE 2.12*  --  1.74* 1.50* 1.45*  CALCIUM 9.0  --  8.2* 8.1* 8.2*   MG  --  2.2  --  1.7  --   PHOS  --   --   --  2.5  --    GFR: Estimated Creatinine Clearance: 18.3 mL/min (A) (by C-G formula based on SCr of 1.45 mg/dL (H)). Liver Function Tests: Recent Labs  Lab 06/19/20 0929  AST 21  ALT 15  ALKPHOS 47  BILITOT 0.7  PROT 6.5  ALBUMIN 3.7   No results for input(s): LIPASE, AMYLASE in the last 168 hours. No results for input(s): AMMONIA in the last 168 hours. Coagulation Profile: Recent Labs  Lab 06/19/20 0929 06/20/20 0323  INR 1.2 1.3*   Cardiac Enzymes: No results for input(s): CKTOTAL, CKMB, CKMBINDEX, TROPONINI in the last 168 hours. BNP (last 3 results) No results for input(s): PROBNP in the last 8760 hours. HbA1C: No results for input(s): HGBA1C in the last 72 hours. CBG: Recent Labs  Lab 06/19/20 1006  GLUCAP 146*   Lipid Profile: No results for input(s): CHOL, HDL, LDLCALC, TRIG, CHOLHDL, LDLDIRECT in the last 72 hours. Thyroid Function Tests: No results for input(s): TSH, T4TOTAL, FREET4, T3FREE, THYROIDAB in the last 72 hours. Anemia Panel: No results for input(s): VITAMINB12, FOLATE, FERRITIN, TIBC, IRON, RETICCTPCT in the last 72 hours. Sepsis Labs: Recent Labs  Lab 06/19/20 0929 06/19/20 1148 06/20/20 0323 06/21/20 2335  PROCALCITON  --   --  <0.10  --   LATICACIDVEN 5.1* 4.0*  --  1.3    Recent Results (from the past 240 hour(s))  Urine culture     Status: Abnormal   Collection Time: 06/19/20  9:29 AM   Specimen: In/Out Cath Urine  Result Value Ref Range Status   Specimen Description   Final    IN/OUT CATH URINE Performed at [facility], [address] [place], [place] [address]    Special Requests   Final    NONE Performed at  [facility], [address] [place], [place] [address]    Culture 80,000 COLONIES/mL ENTEROCOCCUS FAECIUM (A)  Final   Report Status 06/22/2020 FINAL  Final   Organism ID, Bacteria ENTEROCOCCUS FAECIUM (A)  Final       Susceptibility   Enterococcus faecium - MIC*    AMPICILLIN <=2 SENSITIVE Sensitive     NITROFURANTOIN 64 INTERMEDIATE Intermediate     VANCOMYCIN <=0.5 SENSITIVE Sensitive     * 80,000 COLONIES/mL ENTEROCOCCUS FAECIUM  Resp Panel by RT-PCR (Flu A&B, Covid) Nasopharyngeal Swab     Status: None   Collection Time: 06/19/20  9:31 AM   Specimen: Nasopharyngeal Swab; Nasopharyngeal(NP) swabs in vial transport medium  Result Value Ref Range Status   SARS Coronavirus 2 by RT PCR NEGATIVE NEGATIVE Final    Comment: (NOTE) SARS-CoV-2 target nucleic acids are NOT DETECTED.  The SARS-CoV-2 RNA is generally detectable in upper respiratory specimens during the acute phase of infection. The lowest concentration of SARS-CoV-2 viral copies this assay can detect is 138 copies/mL. A negative result does not preclude SARS-Cov-2 infection and should not be used as the sole basis for treatment or other patient management decisions. A negative result may occur with  improper specimen collection/handling, submission of specimen other than nasopharyngeal swab, presence of viral mutation(s) within the areas targeted by this assay, and inadequate number of viral copies(<138 copies/mL). A negative result must be combined with clinical observations, patient history, and epidemiological information. The expected result is Negative.  Fact Sheet for Patients:  BloggerCourse.comhttps://www.fda.gov/media/152166/download  Fact Sheet for Healthcare Providers:  SeriousBroker.ithttps://www.fda.gov/media/152162/download  This test is no t yet approved or cleared by the [place] FDA and  has been authorized for detection and/or diagnosis of SARS-CoV-2 by FDA under an Emergency Use Authorization (EUA). This EUA will remain  in effect (meaning this test can be used) for the duration of the COVID-19 declaration under Section 564(b)(1) of the Act, 21 U.S.C.section 360bbb-3(b)(1), unless the authorization is terminated  or revoked sooner.        Influenza A by PCR NEGATIVE NEGATIVE Final   Influenza B by PCR NEGATIVE NEGATIVE Final    Comment: (NOTE) The Xpert Xpress SARS-CoV-2/FLU/RSV plus assay is intended as an aid in the diagnosis of influenza from Nasopharyngeal swab specimens and should not be used as a sole basis for treatment. Nasal washings and aspirates are unacceptable for Xpert Xpress SARS-CoV-2/FLU/RSV testing.  Fact Sheet for Patients: BloggerCourse.comhttps://www.fda.gov/media/152166/download  Fact Sheet for Healthcare Providers: SeriousBroker.ithttps://www.fda.gov/media/152162/download  This test is not yet approved or cleared by the [place] FDA and has been authorized for detection and/or diagnosis of SARS-CoV-2 by FDA under an Emergency Use Authorization (EUA). This EUA will remain in effect (meaning this test can be used) for the duration of the COVID-19 declaration under Section 564(b)(1) of the Act, 21 U.S.C. section 360bbb-3(b)(1), unless the authorization is terminated or revoked.  Performed at [facility], [address] [place], [place] [address]   Blood Culture (routine x 2)     Status: None (Preliminary result)   Collection Time: 06/19/20 10:16 AM   Specimen: BLOOD  Result Value Ref Range Status   Specimen Description   Final    BLOOD BLOOD RIGHT HAND Performed at [facility], [address] [place], [place] [address]    Special Requests   Final    BOTTLES DRAWN AEROBIC ONLY Blood Culture results may not be optimal due to an inadequate volume of blood received in  culture bottles Performed at [facility], [address], [place], [place] [address]    Culture   Final    NO GROWTH 2 DAYS Performed at [facility], [address], [place], [place] [address]    Report Status PENDING  Incomplete  Blood Culture (routine x 2)     Status: None (Preliminary result)   Collection Time: 06/19/20 11:48 AM   Specimen: BLOOD  Result Value Ref Range Status    Specimen Description   Final    BLOOD LEFT ANTECUBITAL Performed at [facility], [address] [place], [place] [address]    Special Requests   Final    BOTTLES DRAWN AEROBIC AND ANAEROBIC Blood Culture adequate volume Performed at [facility], [address] [place], [place] [address]    Culture   Final    NO GROWTH 2 DAYS Performed at [facility], [address] [place], [place] [address]    Report Status PENDING  Incomplete     Radiology Studies: NM GI Blood Loss  Result Date: 06/21/2020 CLINICAL DATA:  Bright red blood per rectum.  Decreased hemoglobin EXAM: NUCLEAR MEDICINE GASTROINTESTINAL BLEEDING SCAN TECHNIQUE: Sequential abdominal images were obtained following intravenous administration of Tc-77m labeled red blood cells. RADIOPHARMACEUTICALS:  22.4 mCi Tc-48m pertechnetate in-vitro labeled red cells. COMPARISON:  CT abdomen 06/19/2020 FINDINGS: Initial imaging demonstrates tagged red blood cells within the vascular structures and solid organs. No radiotracer accumulation to suggest active extravasation into the gastrointestinal tract. Imaging performed for 2 hours without evidence of gastrointestinal bleeding. IMPRESSION: No evidence of active gastrointestinal bleeding by tagged red blood cell scintigraphy. Electronically Signed   By: Genevive Bi M.D.   On: 06/21/2020 15:58    Scheduled Meds: . (feeding supplement) PROSource Plus  30 mL Oral BID BM  . feeding supplement  1 Container Oral TID BM  . feeding supplement  237 mL Oral BID BM  . multivitamin with minerals  1 tablet Oral Daily  . pantoprazole (PROTONIX) IV  40 mg Intravenous Q12H  . sodium chloride flush  3 mL Intravenous Q12H   Continuous Infusions: . sodium chloride 50 mL/hr at 06/22/20 1305  . sodium chloride    . ampicillin-sulbactam (UNASYN) IV Stopped (06/22/20 0959)     LOS: 3 days    Time spent: 25 mins    Nickie Deren, MD [facility]   If 7PM-7AM, please contact night-coverage

## 2020-06-22 NOTE — Plan of Care (Signed)
  Problem: Clinical Measurements: Goal: Ability to maintain clinical measurements within normal limits will improve Outcome: Progressing   

## 2020-06-22 NOTE — Anesthesia Preprocedure Evaluation (Signed)
Anesthesia Evaluation  Patient identified by MRN, date of birth, ID band Patient unresponsive    Reviewed: Allergy & Precautions, NPO status , Patient's Chart, lab work & pertinent test results  Airway Mallampati: III  TM Distance: >3 FB Neck ROM: Full    Dental  (+) Upper Dentures, Lower Dentures   Pulmonary neg pulmonary ROS,    Pulmonary exam normal breath sounds clear to auscultation       Cardiovascular hypertension, Normal cardiovascular exam Rhythm:Regular Rate:Normal     Neuro/Psych Dementia Acute metabolic encephalopathy    GI/Hepatic Neg liver ROS, GERD  ,  Endo/Other  negative endocrine ROS  Renal/GU negative Renal ROS     Musculoskeletal Decubitus ulcer limited to breakdown of skin (stage 2)   Abdominal   Peds  Hematology  (+) Blood dyscrasia, , S/p transfusion PRBC's Thrombocytopenia    Anesthesia Other Findings Anemia  GI bleeding  Reproductive/Obstetrics                             Anesthesia Physical Anesthesia Plan  ASA: IV  Anesthesia Plan: MAC   Post-op Pain Management:    Induction: Intravenous  PONV Risk Score and Plan: 2 and Propofol infusion and Treatment may vary due to age or medical condition  Airway Management Planned: Nasal Cannula  Additional Equipment:   Intra-op Plan:   Post-operative Plan:   Informed Consent: I have reviewed the patients History and Physical, chart, labs and discussed the procedure including the risks, benefits and alternatives for the proposed anesthesia with the patient or authorized representative who has indicated his/her understanding and acceptance.   Patient has DNR.  Discussed DNR with power of attorney and Suspend DNR.   Dental advisory given and Consent reviewed with POA  Plan Discussed with: CRNA  Anesthesia Plan Comments: (Anesthetic plan discussed with son)        Anesthesia Quick Evaluation

## 2020-06-22 NOTE — Transfer of Care (Signed)
Immediate Anesthesia Transfer of Care Note  Patient: Elizabeth Jarvis  Procedure(s) Performed: ESOPHAGOGASTRODUODENOSCOPY (EGD) (N/A ) BIOPSY  Patient Location: PACU  Anesthesia Type:MAC  Level of Consciousness: drowsy and patient cooperative  Airway & Oxygen Therapy: Patient Spontanous Breathing and Patient connected to nasal cannula oxygen  Post-op Assessment: Report given to RN and Post -op Vital signs reviewed and stable  Post vital signs: Reviewed and stable  Last Vitals:  Vitals Value Taken Time  BP    Temp    Pulse 83 06/22/20 1234  Resp 14 06/22/20 1234  SpO2 100 % 06/22/20 1234  Vitals shown include unvalidated device data.  Last Pain:  Vitals:   06/22/20 1109  TempSrc: Axillary  PainSc: 0-No pain         Complications: No complications documented.

## 2020-06-22 NOTE — Progress Notes (Signed)
Elizabeth Jarvis 11:32 AM  Subjective: Patient seen and examined and case discussed with her son and no signs of current bleeding no new complaints we rediscussed the endoscopy case discussed with the anesthesia team  Objective: Vital signs stable afebrile no acute distress exam please see preassessment evaluation hemoglobin 12.1 BUN and creatinine stable  Assessment: GI bleeding questionable etiology currently stable  Plan: Okay to proceed with endoscopy with anesthesia assistance  [facility]  office [phone] After 5PM or if no answer call [phone]

## 2020-06-22 NOTE — Anesthesia Procedure Notes (Signed)
Procedure Name: MAC Date/Time: 06/22/2020 11:57 AM Performed by: Michele Rockers, CRNA Pre-anesthesia Checklist: Patient identified, Emergency Drugs available, Suction available, Timeout performed and Patient being monitored Patient Re-evaluated:Patient Re-evaluated prior to induction Oxygen Delivery Method: Nasal cannula

## 2020-06-22 NOTE — Progress Notes (Signed)
Daily Progress Note   Patient Name: Elizabeth Jarvis       Date: 06/22/2020 DOB: February 07, 1922  Age: 87 y.o. MRN#: [id number] Attending Physician: Cipriano Bunker, MD Primary Care Physician: Pcp, No Admit Date: 06/19/2020  Reason for Consultation/Follow-up: Establishing goals of care  Subjective: Patient is resting in bed, her son is at bedside. Awaiting EGD today.    Length of Stay: 3  Current Medications: Scheduled Meds:  . [MAR Hold] (feeding supplement) PROSource Plus  30 mL Oral BID BM  . [MAR Hold] feeding supplement  1 Container Oral TID BM  . [MAR Hold] feeding supplement  237 mL Oral BID BM  . [MAR Hold] multivitamin with minerals  1 tablet Oral Daily  . [MAR Hold] pantoprazole (PROTONIX) IV  40 mg Intravenous Q12H  . [MAR Hold] sodium chloride flush  3 mL Intravenous Q12H    Continuous Infusions: . sodium chloride 50 mL/hr at 06/22/20 1153  . [MAR Hold] ampicillin-sulbactam (UNASYN) IV Stopped (06/22/20 0959)    PRN Meds: [MAR Hold] ondansetron **OR** [MAR Hold] ondansetron (ZOFRAN) IV  Physical Exam         General: Awake but confused, in no distress HEENT: No bruits, no goiter, no JVD Heart: Regular rate and rhythm. No murmur appreciated. Lungs: Good air movement, clear Abdomen: Soft, nontender, nondistended, positive bowel sounds.  Ext: Some edema Skin: Warm and dry Neuro: Does not really follow commands but moves 4 extremities  Vital Signs: BP (!) 145/70   Pulse 92   Temp 98.1 F (36.7 C) (Axillary)   Resp 12   Ht 5' (1.524 m)   Wt 59.4 kg   SpO2 100%   BMI 25.58 kg/m  SpO2: SpO2: 100 % O2 Device: O2 Device: Room Air O2 Flow Rate: O2 Flow Rate (L/min): 2 L/min  Intake/output summary:   Intake/Output Summary (Last 24 hours) at 06/22/2020 1213 Last data  filed at 06/22/2020 0315 Gross per 24 hour  Intake 1486.95 ml  Output --  Net 1486.95 ml   LBM: Last BM Date: 06/22/20 Baseline Weight: Weight: 59 kg Most recent weight: Weight: 59.4 kg       Palliative Assessment/Data:    Flowsheet Rows   Flowsheet Row Most Recent Value  Intake Tab   Referral Department Hospitalist  Unit at Time of Referral ER  Palliative  Care Primary Diagnosis Other (Comment)  [GI]  Date Notified 06/19/20  Palliative Care Type New Palliative care  Reason for referral Clarify Goals of Care  Date of Admission 06/19/20  Date first seen by Palliative Care 06/20/20  # of days Palliative referral response time 1 Day(s)  # of days IP prior to Palliative referral 0  Clinical Assessment   Palliative Performance Scale Score 20%  Psychosocial & Spiritual Assessment   Palliative Care Outcomes   Patient/Family meeting held? No      Patient Active Problem List   Diagnosis Date Noted  . Pressure injury of skin 06/21/2020  . Severe sepsis (HCC) 06/19/2020  . Acute blood loss anemia 06/19/2020  . Acute metabolic encephalopathy 06/19/2020  . Gastrointestinal hemorrhage   . Anemia 05/30/2020  . Failure to thrive in adult 05/30/2020  . Acute lower UTI 01/16/2014  . Acute kidney injury (HCC) 01/13/2014  . HTN (hypertension) 01/13/2014  . Leukocytosis, unspecified 01/13/2014  . Unspecified constipation 01/13/2014  . Decubitus ulcer limited to breakdown of skin (stage 2) (HCC) 01/13/2014  . Obesity (BMI 30-39.9) 01/13/2014  . Acute renal failure (HCC) 01/07/2014  . Hyponatremia 01/07/2014  . Edema of both legs 01/07/2014  . Weakness generalized 01/07/2014  . Urinary retention 01/07/2014  . Abdominal pain 01/07/2014    Palliative Care Assessment & Plan   Patient Profile: 87 year old with severe anemia/GI bleed  Recommendations/Plan:  DNR  Continue current interventions. Plan for EGD today.   Son reports that she would not want overly aggressive  interventions if she appears to be approaching end of life.  Brief life review performed, patient is a Chief of Staff, she taught at [employer].   Code Status:    Code Status Orders  (From admission, onward)         Start     Ordered   06/19/20 1315  Do not attempt resuscitation (DNR)  Continuous       Question Answer Comment  In the event of cardiac or respiratory ARREST Do not call a "code blue"   In the event of cardiac or respiratory ARREST Do not perform Intubation, CPR, defibrillation or ACLS   In the event of cardiac or respiratory ARREST Use medication by any route, position, wound care, and other measures to relive pain and suffering. May use oxygen, suction and manual treatment of airway obstruction as needed for comfort.      06/19/20 1314        Code Status History    Date Active Date Inactive Code Status Order ID Comments User Context   05/30/2020 2350 06/02/2020 1950 Full Code [id number]  Macon Large, MD Inpatient   01/13/2014 1857 01/16/2014 1933 DNR [id number]  Hollice Espy, MD Inpatient   01/07/2014 2210 01/11/2014 1948 Full Code [id number]  Ron Parker, MD Inpatient   Advance Care Planning Activity       Prognosis:   Guarded  Discharge Planning:  To Be Determined  Care plan was discussed with son  Thank you for allowing the Palliative Medicine Team to assist in the care of this patient.   Total Time 25 Prolonged Time Billed No      Greater than 50%  of this time was spent counseling and coordinating care related to the above assessment and plan.  Rosalin Hawking, MD  Please contact Palliative Medicine Team phone at [phone] for questions and concerns.

## 2020-06-22 NOTE — Op Note (Signed)
[facility] Patient Name: Elizabeth Jarvis Procedure Date: 06/22/2020 MRN: [id number] Attending MD: Vida Rigger , MD Date of Birth: [birth year] CSN: [id number] Age: 87 Admit Type: Inpatient Procedure:                Upper GI endoscopy Indications:              Melena Providers:                Vida Rigger, MD, Norman Clay, RN, Rozetta Nunnery,                            Technician Referring MD:              Medicines:                Propofol total dose 40 mg IV Complications:            No immediate complications. Estimated Blood Loss:     Estimated blood loss: none. Procedure:                Pre-Anesthesia Assessment:                           - Prior to the procedure, a History and Physical                            was performed, and patient medications and                            allergies were reviewed. The patient's tolerance of                            previous anesthesia was also reviewed. The risks                            and benefits of the procedure and the sedation                            options and risks were discussed with the patient.                            All questions were answered, and informed consent                            was obtained. Prior Anticoagulants: The patient has                            taken no previous anticoagulant or antiplatelet                            agents. ASA Grade Assessment: III - A patient with                            severe systemic disease. After reviewing the risks  and benefits, the patient was deemed in                            satisfactory condition to undergo the procedure.                           After obtaining informed consent, the endoscope was                            passed under direct vision. Throughout the                            procedure, the patient's blood pressure, pulse, and                            oxygen saturations were monitored continuously.  The                            [id number]-H190 ([id number]) was introduced through the                            mouth, and advanced to the second part of duodenum.                            The upper GI endoscopy was accomplished without                            difficulty. The patient tolerated the procedure                            well. Scope In: Scope Out: Findings:      The larynx was normal.      A small hiatal hernia was present.      Patchy minimal inflammation was found in the gastric body and antrum.       Biopsies were taken with a cold forceps for histology of antrum and       fundus to rule out H. pylori.      One non-bleeding cratered duodenal ulcer with no stigmata of bleeding       was found in the duodenal bulb.      The second portion of the duodenum was normal.      The exam was otherwise without abnormality. Impression:               - Normal larynx.                           - Small hiatal hernia.                           - Atrophic gastritis. Biopsied.                           - Non-bleeding duodenal ulcer with no stigmata of                            bleeding.                           -  Normal second portion of the duodenum.                           - The examination was otherwise normal. Moderate Sedation:      Not Applicable - Patient had care per Anesthesia. Recommendation:           - Soft diet today. Continue pump inhibitor long-term                           - No aspirin, ibuprofen, naproxen, or other                            non-steroidal anti-inflammatory drugs Long-term.                           - Await pathology results.                           - Return to GI clinic PRN.                           - Telephone GI clinic for pathology results in 1                            week.                           - Telephone GI clinic if symptomatic PRN. Procedure Code(s):        --- Professional ---                           [phone],  Esophagogastroduodenoscopy, flexible,                            transoral; with biopsy, single or multiple Diagnosis Code(s):        --- Professional ---                           K44.9, Diaphragmatic hernia without obstruction or                            gangrene                           K29.40, Chronic atrophic gastritis without bleeding                           K26.9, Duodenal ulcer, unspecified as acute or                            chronic, without hemorrhage or perforation                           K92.1, Melena (includes Hematochezia) CPT copyright 2019 [employer]. All rights reserved. The codes documented in this report are preliminary and upon coder review may  be revised to meet current compliance requirements. Vida Rigger,  MD 06/22/2020 12:28:01 PM This report has been signed electronically. Number of Addenda: 0

## 2020-06-22 NOTE — Anesthesia Postprocedure Evaluation (Signed)
Anesthesia Post Note  Patient: Elizabeth Jarvis  Procedure(s) Performed: ESOPHAGOGASTRODUODENOSCOPY (EGD) (N/A ) BIOPSY     Patient location during evaluation: Endoscopy Anesthesia Type: MAC Level of consciousness: awake Pain management: pain level controlled Vital Signs Assessment: post-procedure vital signs reviewed and stable Respiratory status: spontaneous breathing, nonlabored ventilation, respiratory function stable and patient connected to nasal cannula oxygen Cardiovascular status: stable and blood pressure returned to baseline Postop Assessment: no apparent nausea or vomiting Anesthetic complications: no   No complications documented.  Last Vitals:  Vitals:   06/22/20 1243 06/22/20 1318  BP: 126/71 137/76  Pulse: 82   Resp: 14   Temp:  (!) 36.3 C  SpO2: 100%     Last Pain:  Vitals:   06/22/20 1318  TempSrc: Axillary  PainSc:                  Karyl Kinnier Lindy Garczynski

## 2020-06-22 NOTE — Progress Notes (Signed)
Pharmacy Antibiotic Note  Elizabeth Jarvis is a 87 y.o. female with hx recurrent GIB presented to the ED on 06/19/2020 with c/o generalized weakness and AMS.  Hemeoccult was positive on admission. She's currently on cefepime and flagyl for sepsis for suspected intra-abdominal infection. UCX is positive for enterococcus faecium. Pharmacy has been consulted on 2/15 to change abx to unasyn.  Today, 06/22/2020: - day #4 abx - afeb, WBC elevated - scr elevated but trending down (1.45, crcl~ 18) - blood cx pending; ucx with 80K of  enterococcus faecium   Plan: - d/c cefepime and flagyl - unasyn 3gm IV q12h  ___________________________________ Height: 5' (152.4 cm) Weight: 59.4 kg (130 lb 15.3 oz) IBW/kg (Calculated) : 45.5  Temp (24hrs), Avg:97.8 F (36.6 C), Min:97.2 F (36.2 C), Max:98 F (36.7 C)  Recent Labs  Lab 06/19/20 0929 06/19/20 1148 06/20/20 0323 06/21/20 2335 06/22/20 0309  WBC 19.6*  --  16.1* 17.6* 17.6*  CREATININE 2.12*  --  1.74* 1.50* 1.45*  LATICACIDVEN 5.1* 4.0*  --  1.3  --     Estimated Creatinine Clearance: 18.3 mL/min (A) (by C-G formula based on SCr of 1.45 mg/dL (H)).    No Known Allergies  2/12 Cefepime >> 2/12 Metronidazole  >>   2/12 COVID: neg 2/12 Influenza A/B: neg/neg 2/12 BCx x2:  2/12 UCx: 80K enterococcus faecium (S= amp, vanc: I= nitro) FINAL  Thank you for allowing pharmacy to be a part of this patient's care.  Lucia Gaskins 06/22/2020 8:17 AM

## 2020-06-23 ENCOUNTER — Encounter (HOSPITAL_COMMUNITY): Payer: Self-pay | Admitting: Gastroenterology

## 2020-06-23 DIAGNOSIS — R531 Weakness: Secondary | ICD-10-CM | POA: Diagnosis not present

## 2020-06-23 DIAGNOSIS — A419 Sepsis, unspecified organism: Secondary | ICD-10-CM | POA: Diagnosis not present

## 2020-06-23 DIAGNOSIS — Z7189 Other specified counseling: Secondary | ICD-10-CM

## 2020-06-23 DIAGNOSIS — Z515 Encounter for palliative care: Secondary | ICD-10-CM

## 2020-06-23 DIAGNOSIS — R6520 Severe sepsis without septic shock: Secondary | ICD-10-CM | POA: Diagnosis not present

## 2020-06-23 LAB — CBC
HCT: 31.7 % — ABNORMAL LOW (ref 36.0–46.0)
Hemoglobin: 10.7 g/dL — ABNORMAL LOW (ref 12.0–15.0)
MCH: 30.3 pg (ref 26.0–34.0)
MCHC: 33.8 g/dL (ref 30.0–36.0)
MCV: 89.8 fL (ref 80.0–100.0)
Platelets: 130 10*3/uL — ABNORMAL LOW (ref 150–400)
RBC: 3.53 MIL/uL — ABNORMAL LOW (ref 3.87–5.11)
RDW: 16.9 % — ABNORMAL HIGH (ref 11.5–15.5)
WBC: 14 10*3/uL — ABNORMAL HIGH (ref 4.0–10.5)
nRBC: 0.8 % — ABNORMAL HIGH (ref 0.0–0.2)

## 2020-06-23 LAB — COMPREHENSIVE METABOLIC PANEL
ALT: 21 U/L (ref 0–44)
AST: 27 U/L (ref 15–41)
Albumin: 2.4 g/dL — ABNORMAL LOW (ref 3.5–5.0)
Alkaline Phosphatase: 32 U/L — ABNORMAL LOW (ref 38–126)
Anion gap: 9 (ref 5–15)
BUN: 30 mg/dL — ABNORMAL HIGH (ref 8–23)
CO2: 16 mmol/L — ABNORMAL LOW (ref 22–32)
Calcium: 8.1 mg/dL — ABNORMAL LOW (ref 8.9–10.3)
Chloride: 119 mmol/L — ABNORMAL HIGH (ref 98–111)
Creatinine, Ser: 1.34 mg/dL — ABNORMAL HIGH (ref 0.44–1.00)
GFR, Estimated: 36 mL/min — ABNORMAL LOW (ref >60)
Glucose, Bld: 123 mg/dL — ABNORMAL HIGH (ref 70–99)
Potassium: 2.9 mmol/L — ABNORMAL LOW (ref 3.5–5.1)
Sodium: 144 mmol/L (ref 135–145)
Total Bilirubin: 1.4 mg/dL — ABNORMAL HIGH (ref 0.3–1.2)
Total Protein: 4.6 g/dL — ABNORMAL LOW (ref 6.5–8.1)

## 2020-06-23 LAB — SURGICAL PATHOLOGY

## 2020-06-23 LAB — HEMOGLOBIN AND HEMATOCRIT, BLOOD
HCT: 32.4 % — ABNORMAL LOW (ref 36.0–46.0)
Hemoglobin: 10.6 g/dL — ABNORMAL LOW (ref 12.0–15.0)

## 2020-06-23 LAB — MAGNESIUM: Magnesium: 1.5 mg/dL — ABNORMAL LOW (ref 1.7–2.4)

## 2020-06-23 LAB — PHOSPHORUS: Phosphorus: 2.1 mg/dL — ABNORMAL LOW (ref 2.5–4.6)

## 2020-06-23 MED ORDER — MAGNESIUM SULFATE 2 GM/50ML IV SOLN
2.0000 g | Freq: Once | INTRAVENOUS | Status: AC
  Administered 2020-06-23: 2 g via INTRAVENOUS
  Filled 2020-06-23: qty 50

## 2020-06-23 MED ORDER — POTASSIUM CHLORIDE 10 MEQ/100ML IV SOLN
10.0000 meq | INTRAVENOUS | Status: AC
  Administered 2020-06-23 (×3): 10 meq via INTRAVENOUS
  Filled 2020-06-23 (×3): qty 100

## 2020-06-23 MED ORDER — POTASSIUM CHLORIDE 10 MEQ/100ML IV SOLN: 10.0000 meq | INTRAVENOUS | Status: DC

## 2020-06-23 MED ORDER — POTASSIUM CHLORIDE CRYS ER 20 MEQ PO TBCR: 40.0000 meq | EXTENDED_RELEASE_TABLET | Freq: Once | ORAL | Status: DC

## 2020-06-23 MED ORDER — RISAQUAD PO CAPS
2.0000 | ORAL_CAPSULE | Freq: Three times a day (TID) | ORAL | Status: DC
  Administered 2020-06-23 – 2020-06-28 (×11): 2 via ORAL
  Filled 2020-06-23 (×13): qty 2

## 2020-06-23 MED ORDER — MEGESTROL ACETATE 400 MG/10ML PO SUSP
400.0000 mg | Freq: Two times a day (BID) | ORAL | Status: DC
  Administered 2020-06-23 – 2020-06-27 (×8): 400 mg via ORAL
  Filled 2020-06-23 (×12): qty 10

## 2020-06-23 NOTE — Progress Notes (Signed)
[facility] Gastroenterology Progress Note  Elizabeth Jarvis 87 y.o. Feb 28, 1922  CC:  Anemia, rectal bleeding  Subjective:  Patient reports abdominal pain but is non-tender on exam.    Patient's son, at bedside, states patient does not have an appetite and was placed on Megace prior to admission.  Per flow sheet, 1 black/green BM this morning.  ROS :  Objective: Vital signs in last 24 hours: Vitals:   06/23/20 0400 06/23/20 0732  BP: 117/73   Pulse: 93   Resp: (!) 23 20  Temp: 98 F (36.7 C)   SpO2: 100%     Physical Exam:  General:  Lethargic, elderly, no distress; son at bedside  Head:  Normocephalic, without obvious abnormality, atraumatic  Eyes:  Conjunctival pallor, EOMs intact  Lungs:   Respirations unlabored, breathing comfortably on room air  Heart:  Regular rate and rhythm, S1, S2 normal  Abdomen:   Soft, non-tender, non-distended.  No guarding or peritoneal signs.  Extremities: Lower extremity edema bilaterally; left hand has swelling today   Pulses: 2+ and symmetric    Lab Results: Recent Labs    06/21/20 2335 06/22/20 0309 06/23/20 0239  NA 143 145 144  K 3.9 3.2* 2.9*  CL 120* 120* 119*  CO2 15* 16* 16*  GLUCOSE 88 86 123*  BUN 46* 44* 30*  CREATININE 1.50* 1.45* 1.34*  CALCIUM 8.1* 8.2* 8.1*  MG 1.7  --  1.5*  PHOS 2.5  --  2.1*   Recent Labs    06/23/20 0239  AST 27  ALT 21  ALKPHOS 32*  BILITOT 1.4*  PROT 4.6*  ALBUMIN 2.4*   Recent Labs    06/22/20 0309 06/22/20 0657 06/23/20 0516 06/23/20 0844  WBC 17.6*  --  14.0*  --   HGB 11.3*   < > 10.7* 10.6*  HCT 33.6*   < > 31.7* 32.4*  MCV 89.8  --  89.8  --   PLT 132*  --  130*  --    < > = values in this interval not displayed.   No results for input(s): LABPROT, INR in the last 72 hours.   Assessment: GI bleeding: melena and hematochezia.  Most likely related to duodenal ulcer, though diverticular bleeding also possible given hematochezia. -EGD 06/22/20 revealed large non-bleeding  duodenal ulcer with no stigmata of bleeding, small hiatal hernia ,atrophic gastritis, biopsied. -Hgb 10.7, stable -Patient with 1 black/green BM this morning  AKI, improving: BUN 30/ Cr 1.34  Assessment: Continue Protonix 40 mg BID for 2 months, followed by once daily thereafter.  Await biopsy results.  If H pylori positive, recommend treatment.  Continue to monitor H&H with transfusion as needed to maintain hemoglobin greater than 7.  Follow-up with PCP in 1-2 weeks for repeat Hgb.  FU with GI clinic as needed.  [facility] will sign off.   Please contact us if we can be of any further assistance during this hospital stay.   Edrick Kins PA-C 06/23/2020, 10:25 AM  Contact #  [phone]

## 2020-06-23 NOTE — Progress Notes (Signed)
Daily Progress Note   Patient Name: Elizabeth Jarvis       Date: 06/23/2020 DOB: October 13, 1921  Age: 87 y.o. MRN#: [id number] Attending Physician: Kendell Bane, MD Primary Care Physician: Pcp, No Admit Date: 06/19/2020  Reason for Consultation/Follow-up: Establishing goals of care  Subjective: Patient is resting in bed, in no distress, IV team trying to start a peripheral IV.    Length of Stay: 4  Current Medications: Scheduled Meds:  . (feeding supplement) PROSource Plus  30 mL Oral BID BM  . acidophilus  2 capsule Oral TID  . feeding supplement  1 Container Oral TID BM  . feeding supplement  237 mL Oral BID BM  . megestrol  400 mg Oral BID  . multivitamin with minerals  1 tablet Oral Daily  . pantoprazole (PROTONIX) IV  40 mg Intravenous Q12H  . sodium chloride flush  3 mL Intravenous Q12H    Continuous Infusions: . sodium chloride 50 mL/hr at 06/22/20 2318  . sodium chloride    . ampicillin-sulbactam (UNASYN) IV 3 g (06/22/20 2320)    PRN Meds: ondansetron **OR** ondansetron (ZOFRAN) IV  Physical Exam         General: Awake but confused, in no distress HEENT: No bruits, no goiter, no JVD Heart: Regular rate and rhythm. No murmur appreciated. Lungs: Good air movement, clear Abdomen: Soft, nontender, nondistended, positive bowel sounds.  Ext: Some edema Skin: Warm and dry Neuro: Does not really follow commands but moves 4 extremities  Vital Signs: BP 117/73 (BP Location: Right Arm)   Pulse 93   Temp 98 F (36.7 C) (Oral)   Resp 20   Ht 5' (1.524 m)   Wt 59.4 kg   SpO2 100%   BMI 25.58 kg/m  SpO2: SpO2: 100 % O2 Device: O2 Device: Room Air O2 Flow Rate: O2 Flow Rate (L/min): 2 L/min  Intake/output summary:   Intake/Output Summary (Last 24 hours) at  06/23/2020 1104 Last data filed at 06/23/2020 0000 Gross per 24 hour  Intake 1362.44 ml  Output --  Net 1362.44 ml   LBM: Last BM Date: 06/23/20 Baseline Weight: Weight: 59 kg Most recent weight: Weight: 59.4 kg       Palliative Assessment/Data:    Flowsheet Rows   Flowsheet Row Most Recent Value  Intake Tab  Referral Department Hospitalist  Unit at Time of Referral ER  Palliative Care Primary Diagnosis Other (Comment)  [GI]  Date Notified 06/19/20  Palliative Care Type New Palliative care  Reason for referral Clarify Goals of Care  Date of Admission 06/19/20  Date first seen by Palliative Care 06/20/20  # of days Palliative referral response time 1 Day(s)  # of days IP prior to Palliative referral 0  Clinical Assessment   Palliative Performance Scale Score 20%  Psychosocial & Spiritual Assessment   Palliative Care Outcomes   Patient/Family meeting held? No      Patient Active Problem List   Diagnosis Date Noted  . Pressure injury of skin 06/21/2020  . Severe sepsis (HCC) 06/19/2020  . Acute blood loss anemia 06/19/2020  . Acute metabolic encephalopathy 06/19/2020  . Gastrointestinal hemorrhage   . Anemia 05/30/2020  . Failure to thrive in adult 05/30/2020  . Acute lower UTI 01/16/2014  . Acute kidney injury (HCC) 01/13/2014  . HTN (hypertension) 01/13/2014  . Leukocytosis, unspecified 01/13/2014  . Unspecified constipation 01/13/2014  . Decubitus ulcer limited to breakdown of skin (stage 2) (HCC) 01/13/2014  . Obesity (BMI 30-39.9) 01/13/2014  . Acute renal failure (HCC) 01/07/2014  . Hyponatremia 01/07/2014  . Edema of both legs 01/07/2014  . Weakness generalized 01/07/2014  . Urinary retention 01/07/2014  . Abdominal pain 01/07/2014    Palliative Care Assessment & Plan   Patient Profile: 87 year old with severe anemia/GI bleed  Recommendations/Plan: DNR Continue current interventions. EGD results noted.  Son reports that she would not want overly  aggressive interventions if she appears to be approaching end of life. Brief life review performed, patient is a Chief of Staff, she taught at [employer].  Continue trial of appetite stimulants, encourage PO, perhaps patient wants to eat familiar indian foods, monitor overall hospital course.  Would likely benefit from either hospice/palliative support on discharge, PMT to follow.   Code Status:    Code Status Orders  (From admission, onward)         Start     Ordered   06/19/20 1315  Do not attempt resuscitation (DNR)  Continuous       Question Answer Comment  In the event of cardiac or respiratory ARREST Do not call a "code blue"   In the event of cardiac or respiratory ARREST Do not perform Intubation, CPR, defibrillation or ACLS   In the event of cardiac or respiratory ARREST Use medication by any route, position, wound care, and other measures to relive pain and suffering. May use oxygen, suction and manual treatment of airway obstruction as needed for comfort.      06/19/20 1314        Code Status History    Date Active Date Inactive Code Status Order ID Comments User Context   05/30/2020 2350 06/02/2020 1950 Full Code [id number]  Macon Large, MD Inpatient   01/13/2014 1857 01/16/2014 1933 DNR [id number]  Hollice Espy, MD Inpatient   01/07/2014 2210 01/11/2014 1948 Full Code [id number]  Ron Parker, MD Inpatient   Advance Care Planning Activity      Prognosis:  Guarded  Discharge Planning: To Be Determined  Care plan was discussed with [facility] MD.   Thank you for allowing the Palliative Medicine Team to assist in the care of this patient.   Total Time 25 Prolonged Time Billed No      Greater than 50%  of this time was spent counseling and coordinating care related to the  above assessment and plan.  Rosalin Hawking, MD  Please contact Palliative Medicine Team phone at [phone] for questions and concerns.

## 2020-06-23 NOTE — Evaluation (Signed)
Physical Therapy Evaluation Patient Details Name: Elizabeth Jarvis MRN: [id number] DOB: 05-04-1922 Today's Date: 06/23/2020   History of Present Illness  87 yo female admitted with sepsis, syncope, anemia. Hx of lymphedema.  Clinical Impression  Bed level eval only. No family present during session. Pt did not seem to understand any of therapist's verbal commands. She only spoke when she read therapist's badge ("physical therapist"). She did allow assessment of UE/LEs and she performed bed level exercises with tactile and demonstration cueing. She did not respond to verbal commands (hard of hearing?). She grimaced with movement of LEs during exercises. Will continue to follow and progress activity as tolerated. At this time, recommendation is for SNF if pf/family are agreeable.     Follow Up Recommendations SNF;Supervision/Assistance - 24 hour (HHPT if progresses well and/or family can provide current level of assist)    Equipment Recommendations  None recommended by PT    Recommendations for Other Services       Precautions / Restrictions Precautions Precautions: Fall Restrictions Weight Bearing Restrictions: No      Mobility  Bed Mobility               General bed mobility comments: Bed level only. Pt did not seem to understand therapist's verbal cues. She held arms across chest when blankets were removed in preparation for moving (as if she was cold).    Transfers                    Ambulation/Gait                Stairs            Wheelchair Mobility    Modified Rankin (Stroke Patients Only)       Balance                                             Pertinent Vitals/Pain Pain Assessment: Faces Faces Pain Scale: Hurts little more Pain Location: with movement of LEs Pain Descriptors / Indicators: Grimacing Pain Intervention(s): Limited activity within patient's tolerance    Home Living Family/patient expects to be  discharged to:: Unsure Living Arrangements: Children Available Help at Discharge: Available PRN/intermittently Type of Home: Apartment         Home Equipment: Walker - 4 wheels;Walker - 2 wheels Additional Comments: appears lives in apartment, unsure if any stairs. Patient speaks some  English  but is HOH so difficult with details    Prior Function           Comments: ambulates with RW     Hand Dominance        Extremity/Trunk Assessment   Upper Extremity Assessment Upper Extremity Assessment: Generalized weakness    Lower Extremity Assessment Lower Extremity Assessment: Generalized weakness       Communication   Communication: HOH  Cognition Arousal/Alertness: Awake/alert Behavior During Therapy: WFL for tasks assessed/performed Overall Cognitive Status: Difficult to assess                                 General Comments: per chart/family, pt understand English. She did not speak during session      General Comments      Exercises General Exercises - Upper Extremity Shoulder Flexion: AROM;Both;5 reps;Supine Elbow Flexion: AROM;Both;5 reps;Supine General Exercises -  Lower Extremity Ankle Circles/Pumps: AROM;Both;5 reps;Supine Quad Sets: AROM;Both;5 reps;Supine Heel Slides: AAROM;Both;5 reps;Supine   Assessment/Plan    PT Assessment Patient needs continued PT services  PT Problem List Decreased strength;Decreased mobility;Decreased cognition;Pain       PT Treatment Interventions DME instruction;Therapeutic activities;Gait training;Therapeutic exercise;Patient/family education;Functional mobility training    PT Goals (Current goals can be found in the Care Plan section)  Acute Rehab PT Goals Patient Stated Goal: none stated PT Goal Formulation: Patient unable to participate in goal setting Time For Goal Achievement: 07/07/20 Potential to Achieve Goals: Fair    Frequency Min 3X/week   Barriers to discharge         Co-evaluation               AM-PAC PT "6 Clicks" Mobility  Outcome Measure Help needed turning from your back to your side while in a flat bed without using bedrails?: A Lot Help needed moving from lying on your back to sitting on the side of a flat bed without using bedrails?: A Lot Help needed moving to and from a bed to a chair (including a wheelchair)?: A Lot Help needed standing up from a chair using your arms (e.g., wheelchair or bedside chair)?: A Lot Help needed to walk in hospital room?: Total Help needed climbing 3-5 steps with a railing? : Total 6 Click Score: 10    End of Session   Activity Tolerance: Patient tolerated treatment well Patient left: in bed;with call bell/phone within reach;with bed alarm set   PT Visit Diagnosis: Muscle weakness (generalized) (M62.81);Difficulty in walking, not elsewhere classified (R26.2)    Time: 8295-6213 PT Time Calculation (min) (ACUTE ONLY): 8 min   Charges:   PT Evaluation $PT Eval Moderate Complexity: 1 Mod            Faye Ramsay, PT Acute Rehabilitation  Office: [phone] Pager: [phone]

## 2020-06-23 NOTE — Plan of Care (Signed)
  Problem: Clinical Measurements: Goal: Diagnostic test results will improve Outcome: Progressing Goal: Respiratory complications will improve Outcome: Progressing Goal: Cardiovascular complication will be avoided Outcome: Progressing   

## 2020-06-23 NOTE — Care Management Important Message (Signed)
Important Message  Patient Details IM Letter placed in Patient's room for son Miguelina Fore Name: Elizabeth Jarvis MRN: [id number] Date of Birth: May 06, 1922   Medicare Important Message Given:        Caren Macadam 06/23/2020, 11:13 AM

## 2020-06-23 NOTE — Progress Notes (Signed)
PROGRESS NOTE    Elizabeth Jarvis  YIF:[id number] DOB: 1921-06-08 DOA: 06/19/2020 PCP: Pcp, No   Brief Narrative:  This 87 years old female with PMH significant for hypertension, GERD, chronic lower extremity lymphedema who was recently hospitalized from 1/23-1/26 for GI bleed and was discharged without intervention. Patient was medically managed during previous hospitalization and was discharged on Protonix and was recommended outpatient follow-up.  Patient has progressive decline in overall health. She presented in the ED with progressive weakness, recurrent GI bleed, altered mentation and syncopal episodes at home in the morning.  Per EMS she was found to be hypotensive with tachycardia and was afebrile. In ED  Lactic acid 5.1, serum creatinine 2.12, WBC 19.6, chest x-ray unremarkable, FOBT positive, CT abdomen: no acute inflammatory process, retained stool in the rectosigmoid, sigmoid diverticulosis.  Patient is admitted for sepsis, started on IV antibiotics, patient was given IV fluids. GI consulted, recommended RBC scan which was negative, underwent EGD which was unremarkable. Patient was given 2 units of packed red blood cells.   Subjective: The patient was seen and examined this morning, awake, nonverbal. Has refused all oral intake this morning  Patient's son present at bedside-discussed current finding plan of care    Assessment & Plan:   Principal Problem:   Severe sepsis (HCC) Active Problems:   Weakness generalized   Acute kidney injury (HCC)   Anemia   Gastrointestinal hemorrhage   Acute blood loss anemia   Acute metabolic encephalopathy   Pressure injury of skin   Severe sepsis, concern for developing shock: -Hemodynamically stable exam -Sepsis physiology resolved She was found septic on admission,: sepsis criteria: hypothermia, tachycardia, leukocytosis, lactic acidosis with AKI and soft blood pressures. Concern for infectious and/or hypovolemia due to blood  loss. Started on emperic antibiotics, metronidazole and cefepime to cover for abdominal infection.  CT abdomen: no acute inflammatory process, retained stool in the rectosigmoid, sigmoid diverticulosis Blood culture: No growth so far. Urine culture grew Enterococcus faecalis Antibiotics changed to Unasyn , planning to change to p.o. Augmentin at discharge. Patient refusing all p.o. intake at this time  Continue IV fluids,  Lactic acid normalized 1.3 Consulted palliative care to discuss goals of care.   Blood loss anemia secondary to Acute GI bleed: Hb 10.2-> 6.3 in 2 weeks and recently hospitalized for GI bleed without endoscopic intervention. Son is okay with the doing endoscopy if it is needed. Gastroenterology consulted,  states patient hemoglobin is stable.  if she bleeds will consider colonoscopy. Patient received 2 units of packed red blood cells,  posttransfusion hemoglobin is 8.6. Continue Protonix 40 mg IV twice daily, continue H&H every 6 hours. Continue n.p.o. for possible intervention. GI recommended RBC scan to locate bleeding if is positive,  consider IR for embolization. RBC scan negative.  Patient underwent EGD which was also unremarkable. GI recommended lifelong PPI,  -Monitoring hemoglobin 10.5, 10.7, 10.6 this a.m.  Syncope, likely vasovagal versus orthostatic in the setting of anemia Occurred during BM at home and lasted few seconds. Continue telemetry  AKI on CKD 3b: >> Improving Baseline creatinine 1.1, creatinine 1.45>> 1.34 today Continue IV fluids .  Failure to thrive, multifactorial: anemia, AKI, sepsis, debility  Elevated troponin: Troponins 40 x 2 flat. suspect secondary to demand ischemia Patient denies any chest pain. Consider cardiology if  troponin trending up.  Altered mental status, suspect acute toxic metabolic encephalopathy and possibly hypoperfusion: Continue treatment as above. No significant changes Per son does understand  [place] language She has been  withdrawn  Goals of Care: Discussed in detail with his son this morning. He is agreeable to current plan.. Understanding slowly declining. Agreeable to placement as patient continued to decline    He is agreeable to EGD.  Also spoke with patient's primary care physician Dr. Georgiann MohsAmit Hinderer in [place].    Poor appetite Patient son is agreeable to trial of Megace, encouraging p.o. intake   DVT prophylaxis: SCDs Code Status: DNR Family Communication:  Spoke with son Sandeep at be side.... Agreeable to current plan Disposition Plan:   Status is: Inpatient  Remains inpatient appropriate because:Inpatient level of care appropriate due to severity of illness   Dispo: The patient is from: Home              Anticipated d/c is to: SNF              Anticipated d/c date is: 1-2 days              Patient currently is not medically stable to d/c.   Difficult to place patient No   Consultants:   Gastroenterology  Palliative care following  Procedures:  Antimicrobials:  Anti-infectives (From admission, onward)   Start     Dose/Rate Route Frequency Ordered Stop   06/22/20 0900  Ampicillin-Sulbactam (UNASYN) 3 g in sodium chloride 0.9 % 100 mL IVPB        3 g 200 mL/hr over 30 Minutes Intravenous Every 12 hours 06/22/20 0832     06/20/20 1000  ceFEPIme (MAXIPIME) 2 g in sodium chloride 0.9 % 100 mL IVPB  Status:  Discontinued        2 g 200 mL/hr over 30 Minutes Intravenous Every 24 hours 06/19/20 1248 06/22/20 0832   06/19/20 1315  ceFEPIme (MAXIPIME) 2 g in sodium chloride 0.9 % 100 mL IVPB  Status:  Discontinued        2 g 200 mL/hr over 30 Minutes Intravenous  Once 06/19/20 1314 06/19/20 1323   06/19/20 1315  metroNIDAZOLE (FLAGYL) IVPB 500 mg  Status:  Discontinued        500 mg 100 mL/hr over 60 Minutes Intravenous Every 8 hours 06/19/20 1314 06/22/20 0832   06/19/20 1100  ceFEPIme (MAXIPIME) 2 g in sodium chloride 0.9 % 100 mL IVPB        2 g 200  mL/hr over 30 Minutes Intravenous  Once 06/19/20 1056 06/19/20 1311   06/19/20 1100  metroNIDAZOLE (FLAGYL) IVPB 500 mg        500 mg 100 mL/hr over 60 Minutes Intravenous  Once 06/19/20 1056 06/19/20 1321      Objective: Vitals:   06/22/20 2111 06/22/20 2200 06/23/20 0400 06/23/20 0732  BP: (!) 148/82  117/73   Pulse:  (!) 114 93   Resp:  20 (!) 23 20  Temp: 98.2 F (36.8 C)  98 F (36.7 C)   TempSrc: Oral  Oral   SpO2:  90% 100%   Weight:      Height:        Intake/Output Summary (Last 24 hours) at 06/23/2020 0925 Last data filed at 06/23/2020 0000 Gross per 24 hour  Intake 1362.44 ml  Output --  Net 1362.44 ml   Filed Weights   06/19/20 1225 06/21/20 0500 06/22/20 0500  Weight: 59 kg 59.7 kg 59.4 kg      Physical Exam:   General:  Alert,  confused .Marland Kitchen.  nonverbal (son present at bedside  HEENT:  Normocephalic, PERRL, otherwise with in Normal  limits   Neuro:  CNII-XII intact. , normal motor and sensation, reflexes intact   Lungs:   Clear to auscultation BL, Respirations unlabored, no wheezes / crackles  Cardio:    S1/S2, RRR, No murmure, No Rubs or Gallops   Abdomen:   Soft, non-tender, bowel sounds active all four quadrants,  no guarding or peritoneal signs.  Muscular skeletal:   Severe generalized weaknesses Limited exam - in bed, able to move all 4 extremities, Normal strength,  2+ pulses,  symmetric, No pitting edema  Skin:  Dry, warm to touch, negative for any Rashes,  Wounds: Please see nursing documentation Pressure Injury 06/20/20 Sacrum Stage 1 -  Intact skin with non-blanchable redness of a localized area usually over a bony prominence. (Active)  06/20/20 1800  Location: Sacrum  Location Orientation:   Staging: Stage 1 -  Intact skin with non-blanchable redness of a localized area usually over a bony prominence.  Wound Description (Comments):   Present on Admission:          Data Reviewed: I have personally reviewed following labs and imaging  studies  CBC: Recent Labs  Lab 06/19/20 0929 06/19/20 1315 06/20/20 0323 06/20/20 1725 06/21/20 2335 06/22/20 0309 06/22/20 0657 06/22/20 1444 06/22/20 1734 06/22/20 2244 06/23/20 0516 06/23/20 0844  WBC 19.6*  --  16.1*  --  17.6* 17.6*  --   --   --   --  14.0*  --   NEUTROABS 15.8*  --   --   --   --   --   --   --   --   --   --   --   HGB 6.3*   < > 8.6*   < > 11.2* 11.3*   < > 11.5* 11.4* 10.5* 10.7* 10.6*  HCT 19.8*   < > 24.9*   < > 32.8* 33.6*   < > 34.5* 33.9* 31.6* 31.7* 32.4*  MCV 90.8  --  86.8  --  87.0 89.8  --   --   --   --  89.8  --   PLT 328  --  162  --  126* 132*  --   --   --   --  130*  --    < > = values in this interval not displayed.   Basic Metabolic Panel: Recent Labs  Lab 06/19/20 0929 06/19/20 1315 06/20/20 0323 06/21/20 2335 06/22/20 0309 06/23/20 0239  NA 135  --  139 143 145 144  K 4.5  --  4.3 3.9 3.2* 2.9*  CL 105  --  114* 120* 120* 119*  CO2 15*  --  17* 15* 16* 16*  GLUCOSE 199*  --  96 88 86 123*  BUN 95*  --  76* 46* 44* 30*  CREATININE 2.12*  --  1.74* 1.50* 1.45* 1.34*  CALCIUM 9.0  --  8.2* 8.1* 8.2* 8.1*  MG  --  2.2  --  1.7  --  1.5*  PHOS  --   --   --  2.5  --  2.1*   GFR: Estimated Creatinine Clearance: 19.8 mL/min (A) (by C-G formula based on SCr of 1.34 mg/dL (H)). Liver Function Tests: Recent Labs  Lab 06/19/20 0929 06/23/20 0239  AST 21 27  ALT 15 21  ALKPHOS 47 32*  BILITOT 0.7 1.4*  PROT 6.5 4.6*  ALBUMIN 3.7 2.4*   No results for input(s): LIPASE, AMYLASE in the last 168 hours. No results for input(s): AMMONIA  in the last 168 hours. Coagulation Profile: Recent Labs  Lab 06/19/20 0929 06/20/20 0323  INR 1.2 1.3*   Cardiac Enzymes: No results for input(s): CKTOTAL, CKMB, CKMBINDEX, TROPONINI in the last 168 hours. BNP (last 3 results) No results for input(s): PROBNP in the last 8760 hours. HbA1C: No results for input(s): HGBA1C in the last 72 hours. CBG: Recent Labs  Lab 06/19/20 1006   GLUCAP 146*   Lipid Profile: No results for input(s): CHOL, HDL, LDLCALC, TRIG, CHOLHDL, LDLDIRECT in the last 72 hours. Thyroid Function Tests: No results for input(s): TSH, T4TOTAL, FREET4, T3FREE, THYROIDAB in the last 72 hours. Anemia Panel: No results for input(s): VITAMINB12, FOLATE, FERRITIN, TIBC, IRON, RETICCTPCT in the last 72 hours. Sepsis Labs: Recent Labs  Lab 06/19/20 0929 06/19/20 1148 06/20/20 0323 06/21/20 2335  PROCALCITON  --   --  <0.10  --   LATICACIDVEN 5.1* 4.0*  --  1.3    Recent Results (from the past 240 hour(s))  Urine culture     Status: Abnormal   Collection Time: 06/19/20  9:29 AM   Specimen: In/Out Cath Urine  Result Value Ref Range Status   Specimen Description   Final    IN/OUT CATH URINE Performed at [facility], [address] [place], [place] [address]    Special Requests   Final    NONE Performed at [facility], [address], [place], [place] [address]    Culture 80,000 COLONIES/mL ENTEROCOCCUS FAECIUM (A)  Final   Report Status 06/22/2020 FINAL  Final   Organism ID, Bacteria ENTEROCOCCUS FAECIUM (A)  Final      Susceptibility   Enterococcus faecium - MIC*    AMPICILLIN <=2 SENSITIVE Sensitive     NITROFURANTOIN 64 INTERMEDIATE Intermediate     VANCOMYCIN <=0.5 SENSITIVE Sensitive     * 80,000 COLONIES/mL ENTEROCOCCUS FAECIUM  Resp Panel by RT-PCR (Flu A&B, Covid) Nasopharyngeal Swab     Status: None   Collection Time: 06/19/20  9:31 AM   Specimen: Nasopharyngeal Swab; Nasopharyngeal(NP) swabs in vial transport medium  Result Value Ref Range Status   SARS Coronavirus 2 by RT PCR NEGATIVE NEGATIVE Final    Comment: (NOTE) SARS-CoV-2 target nucleic acids are NOT DETECTED.  The SARS-CoV-2 RNA is generally detectable in upper respiratory specimens during the acute phase of infection. The lowest concentration of SARS-CoV-2 viral copies this assay can detect is 138 copies/mL. A negative  result does not preclude SARS-Cov-2 infection and should not be used as the sole basis for treatment or other patient management decisions. A negative result may occur with  improper specimen collection/handling, submission of specimen other than nasopharyngeal swab, presence of viral mutation(s) within the areas targeted by this assay, and inadequate number of viral copies(<138 copies/mL). A negative result must be combined with clinical observations, patient history, and epidemiological information. The expected result is Negative.  Fact Sheet for Patients:  BloggerCourse.com  Fact Sheet for Healthcare Providers:  SeriousBroker.it  This test is no t yet approved or cleared by the [place] FDA and  has been authorized for detection and/or diagnosis of SARS-CoV-2 by FDA under an Emergency Use Authorization (EUA). This EUA will remain  in effect (meaning this test can be used) for the duration of the COVID-19 declaration under Section 564(b)(1) of the Act, 21 U.S.C.section 360bbb-3(b)(1), unless the authorization is terminated  or revoked sooner.       Influenza A by PCR NEGATIVE NEGATIVE Final   Influenza B  by PCR NEGATIVE NEGATIVE Final    Comment: (NOTE) The Xpert Xpress SARS-CoV-2/FLU/RSV plus assay is intended as an aid in the diagnosis of influenza from Nasopharyngeal swab specimens and should not be used as a sole basis for treatment. Nasal washings and aspirates are unacceptable for Xpert Xpress SARS-CoV-2/FLU/RSV testing.  Fact Sheet for Patients: BloggerCourse.com  Fact Sheet for Healthcare Providers: SeriousBroker.it  This test is not yet approved or cleared by the [place] FDA and has been authorized for detection and/or diagnosis of SARS-CoV-2 by FDA under an Emergency Use Authorization (EUA). This EUA will remain in effect (meaning this test can be used)  for the duration of the COVID-19 declaration under Section 564(b)(1) of the Act, 21 U.S.C. section 360bbb-3(b)(1), unless the authorization is terminated or revoked.  Performed at [facility], [address], [place], [place] [address]   Blood Culture (routine x 2)     Status: None (Preliminary result)   Collection Time: 06/19/20 10:16 AM   Specimen: BLOOD  Result Value Ref Range Status   Specimen Description   Final    BLOOD BLOOD RIGHT HAND Performed at [facility], [address] [place], [place] [address]    Special Requests   Final    BOTTLES DRAWN AEROBIC ONLY Blood Culture results may not be optimal due to an inadequate volume of blood received in culture bottles Performed at [facility], [address], [place], [place] [address]    Culture   Final    NO GROWTH 4 DAYS Performed at [facility], [address] [place], [place] [address]    Report Status PENDING  Incomplete  Blood Culture (routine x 2)     Status: None (Preliminary result)   Collection Time: 06/19/20 11:48 AM   Specimen: BLOOD  Result Value Ref Range Status   Specimen Description   Final    BLOOD LEFT ANTECUBITAL Performed at [facility], [address] [place], [place] [address]    Special Requests   Final    BOTTLES DRAWN AEROBIC AND ANAEROBIC Blood Culture adequate volume Performed at [facility], [address], [place], [place] [address]    Culture   Final    NO GROWTH 4 DAYS Performed at [facility], [address], [place], [place] [address]    Report Status PENDING  Incomplete     Radiology Studies: NM GI Blood Loss  Result Date: 06/21/2020 CLINICAL DATA:  Bright red blood per rectum.  Decreased hemoglobin EXAM: NUCLEAR MEDICINE GASTROINTESTINAL BLEEDING SCAN TECHNIQUE: Sequential abdominal images were obtained following intravenous administration of Tc-57m labeled red blood  cells. RADIOPHARMACEUTICALS:  22.4 mCi Tc-31m pertechnetate in-vitro labeled red cells. COMPARISON:  CT abdomen 06/19/2020 FINDINGS: Initial imaging demonstrates tagged red blood cells within the vascular structures and solid organs. No radiotracer accumulation to suggest active extravasation into the gastrointestinal tract. Imaging performed for 2 hours without evidence of gastrointestinal bleeding. IMPRESSION: No evidence of active gastrointestinal bleeding by tagged red blood cell scintigraphy. Electronically Signed   By: Genevive Bi M.D.   On: 06/21/2020 15:58    Scheduled Meds: . (feeding supplement) PROSource Plus  30 mL Oral BID BM  . acidophilus  2 capsule Oral TID  . feeding supplement  1 Container Oral TID BM  . feeding supplement  237 mL Oral BID BM  . megestrol  400 mg Oral BID  . multivitamin with minerals  1 tablet Oral Daily  .  pantoprazole (PROTONIX) IV  40 mg Intravenous Q12H  . sodium chloride flush  3 mL Intravenous Q12H   Continuous Infusions: . sodium chloride 50 mL/hr at 06/22/20 2318  . sodium chloride    . ampicillin-sulbactam (UNASYN) IV 3 g (06/22/20 2320)     LOS: 4 days    Time spent: 25 mins    Kendell Bane, MD [facility]   If 7PM-7AM, please contact night-coverage

## 2020-06-23 NOTE — Progress Notes (Signed)
Pt was able to feed self and take oral intake. Pt ate 40% of her lunch tray.

## 2020-06-23 NOTE — Progress Notes (Signed)
Pt's Son in the room and tried to encourage Pt to take oral Medications Pt would not swallow the pills however with Pt's son encouragement Pt did finally swallow the liquid Megace

## 2020-06-24 DIAGNOSIS — Z7189 Other specified counseling: Secondary | ICD-10-CM | POA: Diagnosis not present

## 2020-06-24 DIAGNOSIS — A419 Sepsis, unspecified organism: Secondary | ICD-10-CM | POA: Diagnosis not present

## 2020-06-24 DIAGNOSIS — R6520 Severe sepsis without septic shock: Secondary | ICD-10-CM | POA: Diagnosis not present

## 2020-06-24 DIAGNOSIS — Z515 Encounter for palliative care: Secondary | ICD-10-CM | POA: Diagnosis not present

## 2020-06-24 DIAGNOSIS — R531 Weakness: Secondary | ICD-10-CM | POA: Diagnosis not present

## 2020-06-24 LAB — CBC
HCT: 27.9 % — ABNORMAL LOW (ref 36.0–46.0)
Hemoglobin: 9.1 g/dL — ABNORMAL LOW (ref 12.0–15.0)
MCH: 30 pg (ref 26.0–34.0)
MCHC: 32.6 g/dL (ref 30.0–36.0)
MCV: 92.1 fL (ref 80.0–100.0)
Platelets: 119 10*3/uL — ABNORMAL LOW (ref 150–400)
RBC: 3.03 MIL/uL — ABNORMAL LOW (ref 3.87–5.11)
RDW: 18.6 % — ABNORMAL HIGH (ref 11.5–15.5)
WBC: 11.8 10*3/uL — ABNORMAL HIGH (ref 4.0–10.5)
nRBC: 0.5 % — ABNORMAL HIGH (ref 0.0–0.2)

## 2020-06-24 LAB — CULTURE, BLOOD (ROUTINE X 2)
Culture: NO GROWTH
Culture: NO GROWTH
Special Requests: ADEQUATE

## 2020-06-24 LAB — BASIC METABOLIC PANEL
Anion gap: 6 (ref 5–15)
BUN: 22 mg/dL (ref 8–23)
CO2: 18 mmol/L — ABNORMAL LOW (ref 22–32)
Calcium: 7.9 mg/dL — ABNORMAL LOW (ref 8.9–10.3)
Chloride: 116 mmol/L — ABNORMAL HIGH (ref 98–111)
Creatinine, Ser: 1.02 mg/dL — ABNORMAL HIGH (ref 0.44–1.00)
GFR, Estimated: 50 mL/min — ABNORMAL LOW (ref >60)
Glucose, Bld: 131 mg/dL — ABNORMAL HIGH (ref 70–99)
Potassium: 3.1 mmol/L — ABNORMAL LOW (ref 3.5–5.1)
Sodium: 140 mmol/L (ref 135–145)

## 2020-06-24 MED ORDER — POTASSIUM CHLORIDE 10 MEQ/100ML IV SOLN
10.0000 meq | INTRAVENOUS | Status: AC
  Administered 2020-06-24 (×2): 10 meq via INTRAVENOUS
  Filled 2020-06-24 (×2): qty 100

## 2020-06-24 NOTE — Progress Notes (Signed)
Gastric biopsies from EGD were negative for H. Pylori and revealed gastric antral and oxyntic mucosa with chronic gastritis.  Please contact us if we can be of any further assistance during this hospital stay.  Edrick Kins, PA-C [facility] [phone]

## 2020-06-24 NOTE — Progress Notes (Signed)
Daily Progress Note   Patient Name: Elizabeth Jarvis       Date: 06/24/2020 DOB: [birth year]  Age: 87 y.o. MRN#: [id number] Attending Physician: Kendell Bane, MD Primary Care Physician: Pcp, No Admit Date: 06/19/2020  Reason for Consultation/Follow-up: Establishing goals of care  Subjective: Patient is resting in bed, in no distress, son at bedside, patient trying to eat soft foods.    Length of Stay: 5  Current Medications: Scheduled Meds:  . (feeding supplement) PROSource Plus  30 mL Oral BID BM  . acidophilus  2 capsule Oral TID  . feeding supplement  1 Container Oral TID BM  . feeding supplement  237 mL Oral BID BM  . megestrol  400 mg Oral BID  . multivitamin with minerals  1 tablet Oral Daily  . pantoprazole (PROTONIX) IV  40 mg Intravenous Q12H  . sodium chloride flush  3 mL Intravenous Q12H    Continuous Infusions: . sodium chloride 50 mL/hr at 06/24/20 0757  . sodium chloride    . ampicillin-sulbactam (UNASYN) IV 3 g (06/24/20 0917)  . potassium chloride 10 mEq (06/24/20 1221)    PRN Meds: ondansetron **OR** ondansetron (ZOFRAN) IV  Physical Exam         General: Awake interacts some, in no distress HEENT: No bruits, no goiter, no JVD Heart: Regular rate and rhythm. No murmur appreciated. Lungs: Good air movement, clear Abdomen: Soft, nontender, nondistended, positive bowel sounds.  Ext: Some edema Skin: Warm and dry Neuro:   moves 4 extremities  Vital Signs: BP 110/66   Pulse 84   Temp 97.6 F (36.4 C) (Oral)   Resp 18   Ht 5' (1.524 m)   Wt 63.6 kg   SpO2 100%   BMI 27.38 kg/m  SpO2: SpO2: 100 % O2 Device: O2 Device: Room Air O2 Flow Rate: O2 Flow Rate (L/min): 2 L/min  Intake/output summary:   Intake/Output Summary (Last 24 hours) at  06/24/2020 1228 Last data filed at 06/24/2020 0600 Gross per 24 hour  Intake 1695.02 ml  Output --  Net 1695.02 ml   LBM: Last BM Date: 06/23/20 Baseline Weight: Weight: 59 kg Most recent weight: Weight: 63.6 kg       Palliative Assessment/Data:    Flowsheet Rows   Flowsheet Row Most Recent Value  Intake Tab  Referral Department Hospitalist  Unit at Time of Referral ER  Palliative Care Primary Diagnosis Other (Comment)  [GI]  Date Notified 06/19/20  Palliative Care Type New Palliative care  Reason for referral Clarify Goals of Care  Date of Admission 06/19/20  Date first seen by Palliative Care 06/20/20  # of days Palliative referral response time 1 Day(s)  # of days IP prior to Palliative referral 0  Clinical Assessment   Palliative Performance Scale Score 20%  Psychosocial & Spiritual Assessment   Palliative Care Outcomes   Patient/Family meeting held? No      Patient Active Problem List   Diagnosis Date Noted  . Palliative care by specialist   . Goals of care, counseling/discussion   . General weakness   . Pressure injury of skin 06/21/2020  . Severe sepsis (HCC) 06/19/2020  . Acute blood loss anemia 06/19/2020  . Acute metabolic encephalopathy 06/19/2020  . Gastrointestinal hemorrhage   . Anemia 05/30/2020  . Failure to thrive in adult 05/30/2020  . Acute lower UTI 01/16/2014  . Acute kidney injury (HCC) 01/13/2014  . HTN (hypertension) 01/13/2014  . Leukocytosis, unspecified 01/13/2014  . Unspecified constipation 01/13/2014  . Decubitus ulcer limited to breakdown of skin (stage 2) (HCC) 01/13/2014  . Obesity (BMI 30-39.9) 01/13/2014  . Acute renal failure (HCC) 01/07/2014  . Hyponatremia 01/07/2014  . Edema of both legs 01/07/2014  . Weakness generalized 01/07/2014  . Urinary retention 01/07/2014  . Abdominal pain 01/07/2014    Palliative Care Assessment & Plan   Patient Profile: 87 year old with severe anemia/GI  bleed  Recommendations/Plan: DNR Continue current interventions. EGD results noted.  Son reports that she would not want overly aggressive interventions if she appears to be approaching end of life. Brief life review performed, patient is a Chief of Staff, she taught at [employer].  Continue trial of appetite stimulants, encourage PO, perhaps patient wants to eat familiar indian foods, monitor overall hospital course.  Recommend palliative follow up at facility.   Code Status:    Code Status Orders  (From admission, onward)         Start     Ordered   06/19/20 1315  Do not attempt resuscitation (DNR)  Continuous       Question Answer Comment  In the event of cardiac or respiratory ARREST Do not call a "code blue"   In the event of cardiac or respiratory ARREST Do not perform Intubation, CPR, defibrillation or ACLS   In the event of cardiac or respiratory ARREST Use medication by any route, position, wound care, and other measures to relive pain and suffering. May use oxygen, suction and manual treatment of airway obstruction as needed for comfort.      06/19/20 1314        Code Status History    Date Active Date Inactive Code Status Order ID Comments User Context   05/30/2020 2350 06/02/2020 1950 Full Code [id number]  Macon Large, MD Inpatient   01/13/2014 1857 01/16/2014 1933 DNR [id number]  Hollice Espy, MD Inpatient   01/07/2014 2210 01/11/2014 1948 Full Code [id number]  Ron Parker, MD Inpatient   Advance Care Planning Activity      Prognosis:  Guarded  Discharge Planning: To Be Determined  Care plan was discussed with  Patient , son and TRH MD.   Thank you for allowing the Palliative Medicine Team to assist in the care of this patient.   Total Time 25 Prolonged Time Billed No  Greater than 50%  of this time was spent counseling and coordinating care related to the above assessment and plan.  Rosalin Hawking, MD  Please contact Palliative Medicine Team  phone at [phone] for questions and concerns.

## 2020-06-24 NOTE — Progress Notes (Signed)
PROGRESS NOTE    Elizabeth Jarvis  TOI:[id number] DOB: 1921/06/23 DOA: 06/19/2020 PCP: Pcp, No   Brief Narrative:  This 87 years old female with PMH significant for hypertension, GERD, chronic lower extremity lymphedema who was recently hospitalized from 1/23-1/26 for GI bleed and was discharged without intervention. Patient was medically managed during previous hospitalization and was discharged on Protonix and was recommended outpatient follow-up.  Patient has progressive decline in overall health. She presented in the ED with progressive weakness, recurrent GI bleed, altered mentation and syncopal episodes at home in the morning.  Per EMS she was found to be hypotensive with tachycardia and was afebrile. In ED  Lactic acid 5.1, serum creatinine 2.12, WBC 19.6, chest x-ray unremarkable, FOBT positive, CT abdomen: no acute inflammatory process, retained stool in the rectosigmoid, sigmoid diverticulosis.  Patient is admitted for sepsis, started on IV antibiotics, patient was given IV fluids. GI consulted, recommended RBC scan which was negative, underwent EGD which was unremarkable. Patient was given 2 units of packed red blood cells.   Subjective: The patient was seen and examined this morning, much more interactive. Upon examining touching her lower extremity complains of discomfort ?  Pain  Discussed with nursing staff encouraging p.o. intake... PT OT reevaluation still recommending SNF  Patient son is not present at bedside today  Assessment & Plan:   Principal Problem:   Severe sepsis (HCC) Active Problems:   Weakness generalized   Acute kidney injury (HCC)   Anemia   Gastrointestinal hemorrhage   Acute blood loss anemia   Acute metabolic encephalopathy   Pressure injury of skin   Palliative care by specialist   Goals of care, counseling/discussion   General weakness   Severe sepsis, concern for developing shock: -Hemodynamically stable -Sepsis physiology resolved -She was  found septic on admission,: sepsis criteria: hypothermia, tachycardia, leukocytosis, lactic acidosis with AKI and soft blood pressures.  - CT abdomen: no acute inflammatory process, retained stool in the rectosigmoid, sigmoid diverticulosis Blood culture: No growth so far.  -Urine culture grew Enterococcus faecalis Antibiotics changed to Unasyn , planning to change to p.o. Augmentin at discharge. -Patient continues to refuse p.o. intake, tablets at this time  We will discontinue IV fluids,  Lactic acid normalized 1.3  Consulted palliative care to discuss goals of care .Marland Kitchen Family on board with DNR status   Blood loss anemia secondary to Acute GI bleed: Hb 10.2-> 6.3 in 2 weeks and recently hospitalized for GI bleed without endoscopic intervention. -Status post EGD and biopsy, results negative, no acute findings, also negative for H. pylori Gastroenterology consulted -status post evaluation, EGD states patient hemoglobin is stable.  if she bleeds will consider colonoscopy. Patient received 2 units of packed red blood cells,  posttransfusion hemoglobin is 8.6. Continue Protonix 40 mg IV twice daily, continue H&H every 6 hours >> since stabilized switch to daily. -RBC scan negative.   GI recommended lifelong PPI,  -Monitoring hemoglobin 10.5, 10.7, 10.6  >> 9.1 this a.m.  Syncope, likely vasovagal versus orthostatic in the setting of anemia Occurred during BM at home and lasted few seconds. Continue telemetry  AKI on CKD 3b: >> Improving Baseline creatinine 1.1, creatinine 1.45>> 1.34 .Marland Kitchen 1.02 today Continue IV fluids .  Failure to thrive, multifactorial: anemia, AKI, sepsis, debility  Elevated troponin: Troponins 40 x 2 flat. suspect secondary to demand ischemia Patient denies any chest pain.   Altered mental status, suspect acute toxic metabolic encephalopathy and possibly hypoperfusion: We will continue treatment as above, No  significant changes Per son does understand  [place] language She has been withdrawn--but more interactive today  Goals of Care: Discussed in detail with his son this morning. He is agreeable to current plan.. Understanding slowly declining. Agreeable to placement as patient continued to decline Status post EGD.   Previous hospitalist has contacted patient's primary care physician Dr. Georgiann Mohs in [place].    Poor appetite Patient son is agreeable to trial of Megace, encouraging p.o. intake  Hypokalemia Since patient is having poor p.o. intake we will replete with IV today.   DVT prophylaxis: SCDs Code Status: DNR Family Communication:  Spoke with son Darryll Capers ... Agreeable to current plan Disposition Plan:   Status is: Inpatient  Remains inpatient appropriate because:Inpatient level of care appropriate due to severity of illness   Dispo: The patient is from: Home              Anticipated d/c is to: SNF              Anticipated d/c date is: 1-2 days              Patient currently is not medically stable to d/c.   Difficult to place patient No   Difficulty discharge due to waxing and waning of p.o. intake, including medications, antibiotics   Consultants:   Gastroenterology  Palliative care following  Procedures:  Antimicrobials:  Anti-infectives (From admission, onward)   Start     Dose/Rate Route Frequency Ordered Stop   06/22/20 0900  Ampicillin-Sulbactam (UNASYN) 3 g in sodium chloride 0.9 % 100 mL IVPB        3 g 200 mL/hr over 30 Minutes Intravenous Every 12 hours 06/22/20 0832     06/20/20 1000  ceFEPIme (MAXIPIME) 2 g in sodium chloride 0.9 % 100 mL IVPB  Status:  Discontinued        2 g 200 mL/hr over 30 Minutes Intravenous Every 24 hours 06/19/20 1248 06/22/20 0832   06/19/20 1315  ceFEPIme (MAXIPIME) 2 g in sodium chloride 0.9 % 100 mL IVPB  Status:  Discontinued        2 g 200 mL/hr over 30 Minutes Intravenous  Once 06/19/20 1314 06/19/20 1323   06/19/20 1315  metroNIDAZOLE (FLAGYL) IVPB 500 mg   Status:  Discontinued        500 mg 100 mL/hr over 60 Minutes Intravenous Every 8 hours 06/19/20 1314 06/22/20 0832   06/19/20 1100  ceFEPIme (MAXIPIME) 2 g in sodium chloride 0.9 % 100 mL IVPB        2 g 200 mL/hr over 30 Minutes Intravenous  Once 06/19/20 1056 06/19/20 1311   06/19/20 1100  metroNIDAZOLE (FLAGYL) IVPB 500 mg        500 mg 100 mL/hr over 60 Minutes Intravenous  Once 06/19/20 1056 06/19/20 1321      Objective: Vitals:   06/23/20 2300 06/24/20 0334 06/24/20 0500 06/24/20 0620  BP:  (!) 93/46  110/66  Pulse: 94 84    Resp: 17 18    Temp:  97.6 F (36.4 C)    TempSrc:  Oral    SpO2: 100% 100%    Weight:   63.6 kg   Height:        Intake/Output Summary (Last 24 hours) at 06/24/2020 0957 Last data filed at 06/24/2020 0600 Gross per 24 hour  Intake 1695.02 ml  Output --  Net 1695.02 ml   Filed Weights   06/21/20 0500 06/22/20 0500 06/24/20 0500  Weight:  59.7 kg 59.4 kg 63.6 kg       Physical Exam:   General:   Awake, remains lethargic minimal verbal conversation  HEENT:  Normocephalic, PERRL, otherwise with in Normal limits   Neuro:   Limited exam, confused, CNII-XII intact. , normal motor and sensation, reflexes intact   Lungs:   Clear to auscultation BL, Respirations unlabored, no wheezes / crackles  Cardio:    S1/S2, RRR, No murmure, No Rubs or Gallops   Abdomen:   Soft, non-tender, bowel sounds active all four quadrants,  no guarding or peritoneal signs.  Muscular skeletal:   Severe generalized weaknesses Limited exam - in bed, able to move all 4 extremities,  2+ pulses,  symmetric, +2 pitting edema  Skin:  Dry, warm to touch, negative for any Rashes,  Wounds: Please see nursing documentation Pressure Injury 06/20/20 Sacrum Stage 1 -  Intact skin with non-blanchable redness of a localized area usually over a bony prominence. (Active)  06/20/20 1800  Location: Sacrum  Location Orientation:   Staging: Stage 1 -  Intact skin with non-blanchable  redness of a localized area usually over a bony prominence.  Wound Description (Comments):   Present on Admission:            Data Reviewed: I have personally reviewed following labs and imaging studies  CBC: Recent Labs  Lab 06/19/20 0929 06/19/20 1315 06/20/20 0323 06/20/20 1725 06/21/20 2335 06/22/20 0309 06/22/20 0657 06/22/20 1734 06/22/20 2244 06/23/20 0516 06/23/20 0844 06/24/20 0503  WBC 19.6*  --  16.1*  --  17.6* 17.6*  --   --   --  14.0*  --  11.8*  NEUTROABS 15.8*  --   --   --   --   --   --   --   --   --   --   --   HGB 6.3*   < > 8.6*   < > 11.2* 11.3*   < > 11.4* 10.5* 10.7* 10.6* 9.1*  HCT 19.8*   < > 24.9*   < > 32.8* 33.6*   < > 33.9* 31.6* 31.7* 32.4* 27.9*  MCV 90.8  --  86.8  --  87.0 89.8  --   --   --  89.8  --  92.1  PLT 328  --  162  --  126* 132*  --   --   --  130*  --  119*   < > = values in this interval not displayed.   Basic Metabolic Panel: Recent Labs  Lab 06/19/20 1315 06/20/20 0323 06/21/20 2335 06/22/20 0309 06/23/20 0239 06/24/20 0503  NA  --  139 143 145 144 140  K  --  4.3 3.9 3.2* 2.9* 3.1*  CL  --  114* 120* 120* 119* 116*  CO2  --  17* 15* 16* 16* 18*  GLUCOSE  --  96 88 86 123* 131*  BUN  --  76* 46* 44* 30* 22  CREATININE  --  1.74* 1.50* 1.45* 1.34* 1.02*  CALCIUM  --  8.2* 8.1* 8.2* 8.1* 7.9*  MG 2.2  --  1.7  --  1.5*  --   PHOS  --   --  2.5  --  2.1*  --    GFR: Estimated Creatinine Clearance: 26.8 mL/min (A) (by C-G formula based on SCr of 1.02 mg/dL (H)). Liver Function Tests: Recent Labs  Lab 06/19/20 0929 06/23/20 0239  AST 21 27  ALT 15 21  ALKPHOS  47 32*  BILITOT 0.7 1.4*  PROT 6.5 4.6*  ALBUMIN 3.7 2.4*   No results for input(s): LIPASE, AMYLASE in the last 168 hours. No results for input(s): AMMONIA in the last 168 hours. Coagulation Profile: Recent Labs  Lab 06/19/20 0929 06/20/20 0323  INR 1.2 1.3*   Cardiac Enzymes: No results for input(s): CKTOTAL, CKMB, CKMBINDEX, TROPONINI  in the last 168 hours. BNP (last 3 results) No results for input(s): PROBNP in the last 8760 hours. HbA1C: No results for input(s): HGBA1C in the last 72 hours. CBG: Recent Labs  Lab 06/19/20 1006  GLUCAP 146*   Lipid Profile: No results for input(s): CHOL, HDL, LDLCALC, TRIG, CHOLHDL, LDLDIRECT in the last 72 hours. Thyroid Function Tests: No results for input(s): TSH, T4TOTAL, FREET4, T3FREE, THYROIDAB in the last 72 hours. Anemia Panel: No results for input(s): VITAMINB12, FOLATE, FERRITIN, TIBC, IRON, RETICCTPCT in the last 72 hours. Sepsis Labs: Recent Labs  Lab 06/19/20 0929 06/19/20 1148 06/20/20 0323 06/21/20 2335  PROCALCITON  --   --  <0.10  --   LATICACIDVEN 5.1* 4.0*  --  1.3    Recent Results (from the past 240 hour(s))  Urine culture     Status: Abnormal   Collection Time: 06/19/20  9:29 AM   Specimen: In/Out Cath Urine  Result Value Ref Range Status   Specimen Description   Final    IN/OUT CATH URINE Performed at [facility], [address], [place], [place] [address]    Special Requests   Final    NONE Performed at [facility], [address] [place], [place] [address]    Culture 80,000 COLONIES/mL ENTEROCOCCUS FAECIUM (A)  Final   Report Status 06/22/2020 FINAL  Final   Organism ID, Bacteria ENTEROCOCCUS FAECIUM (A)  Final      Susceptibility   Enterococcus faecium - MIC*    AMPICILLIN <=2 SENSITIVE Sensitive     NITROFURANTOIN 64 INTERMEDIATE Intermediate     VANCOMYCIN <=0.5 SENSITIVE Sensitive     * 80,000 COLONIES/mL ENTEROCOCCUS FAECIUM  Resp Panel by RT-PCR (Flu A&B, Covid) Nasopharyngeal Swab     Status: None   Collection Time: 06/19/20  9:31 AM   Specimen: Nasopharyngeal Swab; Nasopharyngeal(NP) swabs in vial transport medium  Result Value Ref Range Status   SARS Coronavirus 2 by RT PCR NEGATIVE NEGATIVE Final    Comment: (NOTE) SARS-CoV-2 target nucleic acids are NOT DETECTED.  The SARS-CoV-2  RNA is generally detectable in upper respiratory specimens during the acute phase of infection. The lowest concentration of SARS-CoV-2 viral copies this assay can detect is 138 copies/mL. A negative result does not preclude SARS-Cov-2 infection and should not be used as the sole basis for treatment or other patient management decisions. A negative result may occur with  improper specimen collection/handling, submission of specimen other than nasopharyngeal swab, presence of viral mutation(s) within the areas targeted by this assay, and inadequate number of viral copies(<138 copies/mL). A negative result must be combined with clinical observations, patient history, and epidemiological information. The expected result is Negative.  Fact Sheet for Patients:  BloggerCourse.com  Fact Sheet for Healthcare Providers:  SeriousBroker.it  This test is no t yet approved or cleared by the [place] FDA and  has been authorized for detection and/or diagnosis of SARS-CoV-2 by FDA under an Emergency Use Authorization (EUA). This EUA will remain  in effect (meaning this test can be used) for the duration of the COVID-19 declaration under Section 564(b)(1)  of the Act, 21 U.S.C.section 360bbb-3(b)(1), unless the authorization is terminated  or revoked sooner.       Influenza A by PCR NEGATIVE NEGATIVE Final   Influenza B by PCR NEGATIVE NEGATIVE Final    Comment: (NOTE) The Xpert Xpress SARS-CoV-2/FLU/RSV plus assay is intended as an aid in the diagnosis of influenza from Nasopharyngeal swab specimens and should not be used as a sole basis for treatment. Nasal washings and aspirates are unacceptable for Xpert Xpress SARS-CoV-2/FLU/RSV testing.  Fact Sheet for Patients: BloggerCourse.comhttps://www.fda.gov/media/152166/download  Fact Sheet for Healthcare Providers: SeriousBroker.ithttps://www.fda.gov/media/152162/download  This test is not yet approved or cleared by the  [place] FDA and has been authorized for detection and/or diagnosis of SARS-CoV-2 by FDA under an Emergency Use Authorization (EUA). This EUA will remain in effect (meaning this test can be used) for the duration of the COVID-19 declaration under Section 564(b)(1) of the Act, 21 U.S.C. section 360bbb-3(b)(1), unless the authorization is terminated or revoked.  Performed at [facility], [address] [place], [place] [address]   Blood Culture (routine x 2)     Status: None   Collection Time: 06/19/20 10:16 AM   Specimen: BLOOD  Result Value Ref Range Status   Specimen Description   Final    BLOOD BLOOD RIGHT HAND Performed at [facility], [address] [place], [place] [address]    Special Requests   Final    BOTTLES DRAWN AEROBIC ONLY Blood Culture results may not be optimal due to an inadequate volume of blood received in culture bottles Performed at [facility], [address] [place], [place] [address]    Culture   Final    NO GROWTH 5 DAYS Performed at [facility], [address] [place], [place] [address]    Report Status 06/24/2020 FINAL  Final  Blood Culture (routine x 2)     Status: None   Collection Time: 06/19/20 11:48 AM   Specimen: BLOOD  Result Value Ref Range Status   Specimen Description   Final    BLOOD LEFT ANTECUBITAL Performed at [facility], [address] [place], [place] [address]    Special Requests   Final    BOTTLES DRAWN AEROBIC AND ANAEROBIC Blood Culture adequate volume Performed at [facility], [address] [place], [place] [address]    Culture   Final    NO GROWTH 5 DAYS Performed at [facility], [address] [place], [place] [address]    Report Status 06/24/2020 FINAL  Final     Radiology Studies: No results found.  Scheduled Meds: . (feeding supplement) PROSource Plus  30 mL Oral BID BM  . acidophilus  2  capsule Oral TID  . feeding supplement  1 Container Oral TID BM  . feeding supplement  237 mL Oral BID BM  . megestrol  400 mg Oral BID  . multivitamin with minerals  1 tablet Oral Daily  . pantoprazole (PROTONIX) IV  40 mg Intravenous Q12H  . sodium chloride flush  3 mL Intravenous Q12H   Continuous Infusions: . sodium chloride 50 mL/hr at 06/24/20 0757  . sodium chloride    . ampicillin-sulbactam (UNASYN) IV 3 g (06/24/20 0917)     LOS: 5 days    Time spent: 25 mins    Kendell BaneSeyed A Cynthea Zachman, MD [facility]   If 7PM-7AM, please contact night-coverage

## 2020-06-24 NOTE — TOC Initial Note (Signed)
Transition of Care [facility]) - Initial/Assessment Note    Patient Details  Name: Elizabeth Jarvis MRN: [id number] Date of Birth: 1921-07-02  Transition of Care [facility]) CM/SW Contact:    Lanier Clam, RN Phone Number: 06/24/2020, 1:28 PM  Clinical Narrative: Spoke to son Sandeep about d/c plans-d/c SNF w/palliative care services.Faxed out-await bed offers.                  Expected Discharge Plan: Skilled Nursing Facility Barriers to Discharge: Continued Medical Work up   Patient Goals and CMS Choice Patient states their goals for this hospitalization and ongoing recovery are:: go to rehab CMS Medicare.gov Compare Post Acute Care list provided to:: Patient Represenative (must comment) Choice offered to / list presented to : Adult Children  Expected Discharge Plan and Services Expected Discharge Plan: Skilled Nursing Facility   Discharge Planning Services: CM Consult Post Acute Care Choice: Skilled Nursing Facility Living arrangements for the past 2 months: Single Family Home                                      Prior Living Arrangements/Services Living arrangements for the past 2 months: Single Family Home Lives with:: Adult Children Patient language and need for interpreter reviewed:: Yes Do you feel safe going back to the place where you live?: Yes      Need for Family Participation in Patient Care: No (Comment) Care giver support system in place?: Yes (comment)   Criminal Activity/Legal Involvement Pertinent to Current Situation/Hospitalization: No - Comment as needed  Activities of Daily Living Home Assistive Devices/Equipment: Grab bars around toilet,Shower chair without back,Walker (specify type),Wheelchair (front wheel walker, rollator) ADL Screening (condition at time of admission) Patient's cognitive ability adequate to safely complete daily activities?: No Is the patient deaf or have difficulty hearing?: Yes Does the patient have difficulty seeing, even when  wearing glasses/contacts?: Yes Does the patient have difficulty concentrating, remembering, or making decisions?: No Patient able to express need for assistance with ADLs?: Yes Does the patient have difficulty dressing or bathing?: Yes Independently performs ADLs?: No Communication: Independent Dressing (OT): Needs assistance Is this a change from baseline?: Change from baseline, expected to last <3days Grooming: Needs assistance Is this a change from baseline?: Change from baseline, expected to last <3 days Feeding: Needs assistance Is this a change from baseline?: Change from baseline, expected to last <3 days Bathing: Needs assistance Is this a change from baseline?: Change from baseline, expected to last <3 days Toileting: Needs assistance Is this a change from baseline?: Change from baseline, expected to last <3 days In/Out Bed: Needs assistance Is this a change from baseline?: Change from baseline, expected to last <3 days Walks in Home: Needs assistance Is this a change from baseline?: Change from baseline, expected to last <3 days Does the patient have difficulty walking or climbing stairs?: Yes Weakness of Legs: Both Weakness of Arms/Hands: None  Permission Sought/Granted Permission sought to share information with : Case Manager Permission granted to share information with : Yes, Verbal Permission Granted  Share Information with NAME: Case Manager     Permission granted to share info w Relationship: Jazzmin Newbold son [phone]     Emotional Assessment Appearance:: Appears stated age Attitude/Demeanor/Rapport: Gracious Affect (typically observed): Accepting Orientation: : Oriented to Self Alcohol / Substance Use: Not Applicable Psych Involvement: No (comment)  Admission diagnosis:  Anemia [D64.9] Severe sepsis (HCC) [  A41.9, R65.20] Sepsis with encephalopathy without septic shock, due to unspecified organism (HCC) [A41.9, R65.20, G93.40] Patient Active Problem  List   Diagnosis Date Noted  . Palliative care by specialist   . Goals of care, counseling/discussion   . General weakness   . Pressure injury of skin 06/21/2020  . Severe sepsis (HCC) 06/19/2020  . Acute blood loss anemia 06/19/2020  . Acute metabolic encephalopathy 06/19/2020  . Gastrointestinal hemorrhage   . Anemia 05/30/2020  . Failure to thrive in adult 05/30/2020  . Acute lower UTI 01/16/2014  . Acute kidney injury (HCC) 01/13/2014  . HTN (hypertension) 01/13/2014  . Leukocytosis, unspecified 01/13/2014  . Unspecified constipation 01/13/2014  . Decubitus ulcer limited to breakdown of skin (stage 2) (HCC) 01/13/2014  . Obesity (BMI 30-39.9) 01/13/2014  . Acute renal failure (HCC) 01/07/2014  . Hyponatremia 01/07/2014  . Edema of both legs 01/07/2014  . Weakness generalized 01/07/2014  . Urinary retention 01/07/2014  . Abdominal pain 01/07/2014   PCP:  Pcp, No Pharmacy:   [facility] [address], [place] - [address] [place] [address] Phone: [phone] Fax: [phone]     Social Determinants of Health (SDOH) Interventions    Readmission Risk Interventions No flowsheet data found.

## 2020-06-24 NOTE — Evaluation (Signed)
Occupational Therapy Evaluation Patient Details Name: Nike Southers MRN: [id number] DOB: Aug 21, 1921 Today's Date: 06/24/2020    History of Present Illness 87 yo female admitted with sepsis, syncope, anemia. Hx of lymphedema.   Clinical Impression   Patient is very pleasant however HOH and difficult to understand speech at times. When asked how she is feeling today clearly states "better." Difficult to obtain PLOF info, asked patient when she last stood/used her walker states "a month" unsure if this is accurate as family not present during evaluation. Pt agreeable to try sitting up at EOB, needing maximal assistance due to edematous LEs + significant weakness. Pt able to hold herself up at EOB however favors leaning onto R forearm/elbow. Will correct posture when cued for brief periods before fatiguing and laying back onto R arm. Attempted standing once from EOB however when attempting to provide assistance at hips pt moans and is unable to lift buttocks from EOB at all. Will continue to follow acutely to assess GOC and hopefully follow up with family to obtain further info re: PLOF/ability to provide care at home.    Follow Up Recommendations  SNF;Other (comment) (vs home with family if can provide level of assist)    Equipment Recommendations  Hospital bed;Other (comment) (if home with family)       Precautions / Restrictions Precautions Precautions: Fall Restrictions Weight Bearing Restrictions: No      Mobility Bed Mobility Overal bed mobility: Needs Assistance Bed Mobility: Supine to Sit;Sit to Supine     Supine to sit: Max assist Sit to supine: Mod assist   General bed mobility comments: needs max multimodal cues to sequence sitting up at EOB, needs maximal assistance for LE management and assist to upright trunk    Transfers                 General transfer comment: unable, attempted to stand once however unable to lift buttock at all from EOB and starts moaning  when OT assists    Balance Overall balance assessment: Needs assistance Sitting-balance support: Single extremity supported Sitting balance-Leahy Scale: Poor Sitting balance - Comments: will sit upright briefly, has R lateral bias                                   ADL either performed or assessed with clinical judgement   ADL Overall ADL's : Needs assistance/impaired Eating/Feeding: Set up;Sitting Eating/Feeding Details (indicate cue type and reason): to drink from up Grooming: Wash/dry face;Set up;Sitting   Upper Body Bathing: Moderate assistance;Sitting   Lower Body Bathing: Total assistance;Sitting/lateral leans;Bed level   Upper Body Dressing : Moderate assistance;Sitting Upper Body Dressing Details (indicate cue type and reason): fatigues quickly with R bias onto forearm/elbow Lower Body Dressing: Total assistance;Bed level Lower Body Dressing Details (indicate cue type and reason): to don socks, edematous LEs, significant weakness   Toilet Transfer Details (indicate cue type and reason): unable, attempted sit to stand once from EOB however patient is fatigued and does not put forth effort to push from EOB. unable to lift buttock Toileting- Clothing Manipulation and Hygiene: Total assistance;Bed level         General ADL Comments: patient needing increased assistance for all self care tasks due to poor activity tolerance, balance, cognition                  Pertinent Vitals/Pain Pain Assessment: Faces Faces Pain Scale: Hurts even more  Pain Location: movement of LEs Pain Descriptors / Indicators: Grimacing;Moaning     Hand Dominance  (did not specify)   Extremity/Trunk Assessment Upper Extremity Assessment Upper Extremity Assessment: Generalized weakness   Lower Extremity Assessment Lower Extremity Assessment: Defer to PT evaluation       Communication Communication Communication: HOH   Cognition Arousal/Alertness: Awake/alert Behavior  During Therapy: WFL for tasks assessed/performed Overall Cognitive Status: Difficult to assess                                 General Comments: patient does converse more with OT, when asked how she is today states "better" but at other times when talking difficult to understand, possibly speaking in Hindi? difficulty following directions needing multimodal cues which could also be related to [facility]   General Comments  VSS on RA            Home Living Family/patient expects to be discharged to:: Unsure Living Arrangements: Children Available Help at Discharge: Available PRN/intermittently Type of Home: Apartment                       Home Equipment: Walker - 4 wheels;Walker - 2 wheels   Additional Comments: info obtained from PT eval, patient is HOH at times making nonsensical statements      Prior Functioning/Environment          Comments: unsure, asked pt when the last time she stood says "a month"        OT Problem List: Decreased strength;Decreased activity tolerance;Impaired balance (sitting and/or standing);Decreased cognition;Decreased safety awareness;Pain      OT Treatment/Interventions: Self-care/ADL training;Therapeutic exercise;Therapeutic activities;Patient/family education;Balance training;Cognitive remediation/compensation    OT Goals(Current goals can be found in the care plan section) Acute Rehab OT Goals Patient Stated Goal: "my tongue is rough" OT Goal Formulation: With patient Time For Goal Achievement: 07/08/20 Potential to Achieve Goals: Fair  OT Frequency: Min 2X/week    AM-PAC OT "6 Clicks" Daily Activity     Outcome Measure Help from another person eating meals?: A Little Help from another person taking care of personal grooming?: A Little Help from another person toileting, which includes using toliet, bedpan, or urinal?: Total Help from another person bathing (including washing, rinsing, drying)?: A Lot Help from  another person to put on and taking off regular upper body clothing?: A Lot Help from another person to put on and taking off regular lower body clothing?: Total 6 Click Score: 12   End of Session Nurse Communication: Mobility status  Activity Tolerance: Patient limited by fatigue Patient left: in bed;with call bell/phone within reach;with bed alarm set  OT Visit Diagnosis: Unsteadiness on feet (R26.81);Other abnormalities of gait and mobility (R26.89);Muscle weakness (generalized) (M62.81);Other symptoms and signs involving cognitive function;Pain Pain - part of body: Leg (bilateral)                Time: 5638-9373 OT Time Calculation (min): 28 min Charges:  OT General Charges $OT Visit: 1 Visit OT Evaluation $OT Eval Moderate Complexity: 1 Mod OT Treatments $Self Care/Home Management : 8-22 mins  Marlyce Huge OT OT pager: [phone]  Carmelia Roller 06/24/2020, 12:42 PM

## 2020-06-24 NOTE — NC FL2 (Signed)
[place] MEDICAID FL2 LEVEL OF CARE SCREENING TOOL     IDENTIFICATION  Patient Name: Elizabeth Jarvis Birthdate: 1922/04/08 Sex: female Admission Date (Current Location): 06/19/2020  [facility] and [place] Number:  Producer, television/film/video and Address:  [facility],  [address] [place] [address]      Provider Number: [id number]  Attending Physician Name and Address:  Kendell Bane, MD  Relative Name and Phone Number:  Nyanna Heideman son [phone]    Current Level of Care: Hospital Recommended Level of Care: Skilled Nursing Facility Prior Approval Number:    Date Approved/Denied:   PASRR Number: [id number]  Discharge Plan: SNF    Current Diagnoses: Patient Active Problem List   Diagnosis Date Noted  . Palliative care by specialist   . Goals of care, counseling/discussion   . General weakness   . Pressure injury of skin 06/21/2020  . Severe sepsis (HCC) 06/19/2020  . Acute blood loss anemia 06/19/2020  . Acute metabolic encephalopathy 06/19/2020  . Gastrointestinal hemorrhage   . Anemia 05/30/2020  . Failure to thrive in adult 05/30/2020  . Acute lower UTI 01/16/2014  . Acute kidney injury (HCC) 01/13/2014  . HTN (hypertension) 01/13/2014  . Leukocytosis, unspecified 01/13/2014  . Unspecified constipation 01/13/2014  . Decubitus ulcer limited to breakdown of skin (stage 2) (HCC) 01/13/2014  . Obesity (BMI 30-39.9) 01/13/2014  . Acute renal failure (HCC) 01/07/2014  . Hyponatremia 01/07/2014  . Edema of both legs 01/07/2014  . Weakness generalized 01/07/2014  . Urinary retention 01/07/2014  . Abdominal pain 01/07/2014    Orientation RESPIRATION BLADDER Height & Weight     Self,Place  Normal Incontinent Weight: 63.6 kg Height:  5' (152.4 cm)  BEHAVIORAL SYMPTOMS/MOOD NEUROLOGICAL BOWEL NUTRITION STATUS      Incontinent Diet (Soft)  AMBULATORY STATUS COMMUNICATION OF NEEDS Skin   Limited Assist Verbally Bruising (gen'l bruising)                        Personal Care Assistance Level of Assistance  Bathing,Dressing Bathing Assistance: Limited assistance   Dressing Assistance: Limited assistance     Functional Limitations Info  Sight,Hearing,Speech Sight Info: Impaired (eyeglasses) Hearing Info: Impaired (R ear hearing magnifier) Speech Info: Adequate    SPECIAL CARE FACTORS FREQUENCY  PT (By licensed PT),OT (By licensed OT)     PT Frequency: 5x week OT Frequency: 5x week            Contractures Contractures Info: Not present    Additional Factors Info  Code Status,Allergies Code Status Info:  (DNR) Allergies Info:  (NKA)           Current Medications (06/24/2020):  This is the current hospital active medication list Current Facility-Administered Medications  Medication Dose Route Frequency Provider Last Rate Last Admin  . (feeding supplement) PROSource Plus liquid 30 mL  30 mL Oral BID BM Vida Rigger, MD   30 mL at 06/24/20 1221  . 0.9 %  sodium chloride infusion   Intravenous Continuous Vida Rigger, MD 50 mL/hr at 06/24/20 0757 Rate Change at 06/24/20 0757  . 0.9 %  sodium chloride infusion   Intravenous Continuous Baron-Johnson, Alison, PA-C      . acidophilus (RISAQUAD) capsule 2 capsule  2 capsule Oral TID Nevin Bloodgood A, MD   2 capsule at 06/24/20 1039  . Ampicillin-Sulbactam (UNASYN) 3 g in sodium chloride 0.9 % 100 mL IVPB  3 g Intravenous Q12H Vida Rigger, MD  200 mL/hr at 06/24/20 0917 3 g at 06/24/20 0917  . feeding supplement (BOOST / RESOURCE BREEZE) liquid 1 Container  1 Container Oral TID BM Vida Rigger, MD   1 Container at 06/24/20 0915  . feeding supplement (ENSURE ENLIVE / ENSURE PLUS) liquid 237 mL  237 mL Oral BID BM Vida Rigger, MD   237 mL at 06/24/20 1040  . megestrol (MEGACE) 400 MG/10ML suspension 400 mg  400 mg Oral BID Shahmehdi, Seyed A, MD   400 mg at 06/24/20 1041  . multivitamin with minerals tablet 1 tablet  1 tablet Oral Daily Vida Rigger, MD   1 tablet at 06/24/20  1040  . ondansetron (ZOFRAN) tablet 4 mg  4 mg Oral Q6H PRN Vida Rigger, MD       Or  . ondansetron [employer]) injection 4 mg  4 mg Intravenous Q6H PRN Vida Rigger, MD      . pantoprazole (PROTONIX) injection 40 mg  40 mg Intravenous Q12H Vida Rigger, MD   40 mg at 06/24/20 1045  . sodium chloride flush (NS) 0.9 % injection 3 mL  3 mL Intravenous Q12H Vida Rigger, MD   3 mL at 06/24/20 1040     Discharge Medications: Please see discharge summary for a list of discharge medications.  Relevant Imaging Results:  Relevant Lab Results:   Additional Information SS# [id number], Olegario Messier, [place]

## 2020-06-24 NOTE — Care Management Important Message (Signed)
Important Message  Patient Details Letter given on 06/23/20 Name: Elizabeth Jarvis MRN: [id number] Date of Birth: Dec 29, 1921   Medicare Important Message Given:  Yes     Caren Macadam 06/24/2020, 10:57 AM

## 2020-06-24 NOTE — Plan of Care (Signed)
  Problem: Clinical Measurements: Goal: Respiratory complications will improve Outcome: Progressing   Problem: Nutrition: Goal: Adequate nutrition will be maintained Outcome: Progressing   Problem: Elimination: Goal: Will not experience complications related to bowel motility Outcome: Progressing Goal: Will not experience complications related to urinary retention Outcome: Progressing   

## 2020-06-25 DIAGNOSIS — R531 Weakness: Secondary | ICD-10-CM | POA: Diagnosis not present

## 2020-06-25 DIAGNOSIS — A419 Sepsis, unspecified organism: Secondary | ICD-10-CM | POA: Diagnosis not present

## 2020-06-25 DIAGNOSIS — Z515 Encounter for palliative care: Secondary | ICD-10-CM | POA: Diagnosis not present

## 2020-06-25 DIAGNOSIS — Z7189 Other specified counseling: Secondary | ICD-10-CM | POA: Diagnosis not present

## 2020-06-25 DIAGNOSIS — R6520 Severe sepsis without septic shock: Secondary | ICD-10-CM | POA: Diagnosis not present

## 2020-06-25 MED ORDER — PANTOPRAZOLE SODIUM 40 MG PO TBEC
40.0000 mg | DELAYED_RELEASE_TABLET | Freq: Two times a day (BID) | ORAL | Status: DC
  Administered 2020-06-25 – 2020-06-28 (×6): 40 mg via ORAL
  Filled 2020-06-25 (×6): qty 1

## 2020-06-25 NOTE — Progress Notes (Signed)
Civil engineer, contracting [facility]) Hospital Liaison note.    Received request from [facility] manager for family interest in [facility]. Called son Darryll Capers at [phone], left voicemail with contact information for [facility] liaison team.  [facility] liaison will follow up with family tomorrow.    Thank you for the opportunity to participate in this patient's care.  Gillian Scarce, BSN, RN [facility] Liaison (listed on [place] under [facility]osp[facility])    [phone]  (24h on call)

## 2020-06-25 NOTE — Progress Notes (Signed)
PROGRESS NOTE    Elizabeth Jarvis  VEL:[id number] DOB: 09/08/1921 DOA: 06/19/2020 PCP: Pcp, No   Brief Narrative:  This 87 years old female with PMH significant for hypertension, GERD, chronic lower extremity lymphedema who was recently hospitalized from 1/23-1/26 for GI bleed and was discharged without intervention. Patient was medically managed during previous hospitalization and was discharged on Protonix and was recommended outpatient follow-up.  Patient has progressive decline in overall health. She presented in the ED with progressive weakness, recurrent GI bleed, altered mentation and syncopal episodes at home in the morning.  Per EMS she was found to be hypotensive with tachycardia and was afebrile. In ED  Lactic acid 5.1, serum creatinine 2.12, WBC 19.6, chest x-ray unremarkable, FOBT positive, CT abdomen: no acute inflammatory process, retained stool in the rectosigmoid, sigmoid diverticulosis.  Patient is admitted for sepsis, started on IV antibiotics, patient was given IV fluids. GI consulted, recommended RBC scan which was negative, underwent EGD which was unremarkable. Patient was given 2 units of packed red blood cells.   Subjective: The patient was seen and examined this morning, more awake and interactive.. Seems to have very poor insight at this time, confused but not agitated (Patient apparently is highly educated)     Assessment & Plan:   Principal Problem:   Severe sepsis (HCC) Active Problems:   Weakness generalized   Acute kidney injury (HCC)   Anemia   Gastrointestinal hemorrhage   Acute blood loss anemia   Acute metabolic encephalopathy   Pressure injury of skin   Palliative care by specialist   Goals of care, counseling/discussion   General weakness   Severe sepsis, concern for developing shock: -Hemodynamically stable -Sepsis physiology resolved -She was found septic on admission,: sepsis criteria: hypothermia, tachycardia, leukocytosis, lactic acidosis  with AKI and soft blood pressures.  - CT abdomen: no acute inflammatory process, retained stool in the rectosigmoid, sigmoid diverticulosis Blood culture: No growth so far.  -Urine culture grew Enterococcus faecalis Antibiotics changed to Unasyn , planning to change to p.o. Augmentin at discharge. -Continue to have periodic poor p.o. intake, not cooperative with oral meds  Status post IV fluids,  Lactic acid normalized 1.3  DNR/DNI, family patient's son on board  Blood loss anemia secondary to Acute GI bleed: Hb 10.2-> 6.3 in 2 weeks and recently hospitalized for GI bleed without endoscopic intervention. -Status post EGD and biopsy, results negative, no acute findings, also negative for H. pylori Gastroenterology consulted -status post evaluation, EGD states patient hemoglobin is stable.  if she bleeds will consider colonoscopy. Patient received 2 units of packed red blood cells,  posttransfusion hemoglobin is 8.6. Continue Protonix 40 mg IV twice daily, continue H&H every 6 hours >> since stabilized switch to daily. -RBC scan negative.   GI recommended lifelong PPI,  -Monitoring hemoglobin 10.5, 10.7, 10.6  >> 9.1  Syncope, likely vasovagal versus orthostatic in the setting of anemia Occurred during BM at home and lasted few seconds. Continue telemetry  AKI on CKD 3b: >> Improving Baseline creatinine 1.1, creatinine 1.45>> 1.34 .Marland Kitchen 1.02 today Continue IV fluids .  Failure to thrive, multifactorial: anemia, AKI, sepsis, debility  Elevated troponin: Troponins 40 x 2 flat. suspect secondary to demand ischemia Patient denies any chest pain.   Altered mental status, suspect acute toxic metabolic encephalopathy and possibly hypoperfusion: -No significant changes management as above Per son does understand [place] language She has been withdrawn--continues to be interactive  Goals of Care: Discussed with nursing staff, SW, Palliative care, Dr.  Linna Darner and his son at  bedside All in agreeable to disposition of the SNF--and possible comfort care measures) Status post EGD.   (Previous hospitalist has contacted patient's primary care physician Dr. Georgiann Mohs in [place]).    Poor appetite We will continue with appetite stimulant Megace, patient prefers home food   Hypokalemia Since patient is having poor p.o. intake we will replete with IV today.   DVT prophylaxis: SCDs Code Status: DNR Family Communication:  Spoke with son Darryll Capers ... Agreeable to current plan Disposition Plan:   Status is: Inpatient  Remains inpatient appropriate because:Inpatient level of care appropriate due to severity of illness   Dispo: The patient is from: Home              Anticipated d/c is to: SNF-with palliative care              Anticipated d/c date is: 1-2 days              Patient currently is not medically stable to d/c.   Difficult to place patient No   Difficulty discharge due to waxing and waning of p.o. intake, including medications, antibiotics   Consultants:   Gastroenterology  Palliative care following  Procedures:  Antimicrobials:  Anti-infectives (From admission, onward)   Start     Dose/Rate Route Frequency Ordered Stop   06/22/20 0900  Ampicillin-Sulbactam (UNASYN) 3 g in sodium chloride 0.9 % 100 mL IVPB        3 g 200 mL/hr over 30 Minutes Intravenous Every 12 hours 06/22/20 0832     06/20/20 1000  ceFEPIme (MAXIPIME) 2 g in sodium chloride 0.9 % 100 mL IVPB  Status:  Discontinued        2 g 200 mL/hr over 30 Minutes Intravenous Every 24 hours 06/19/20 1248 06/22/20 0832   06/19/20 1315  ceFEPIme (MAXIPIME) 2 g in sodium chloride 0.9 % 100 mL IVPB  Status:  Discontinued        2 g 200 mL/hr over 30 Minutes Intravenous  Once 06/19/20 1314 06/19/20 1323   06/19/20 1315  metroNIDAZOLE (FLAGYL) IVPB 500 mg  Status:  Discontinued        500 mg 100 mL/hr over 60 Minutes Intravenous Every 8 hours 06/19/20 1314 06/22/20 0832   06/19/20 1100   ceFEPIme (MAXIPIME) 2 g in sodium chloride 0.9 % 100 mL IVPB        2 g 200 mL/hr over 30 Minutes Intravenous  Once 06/19/20 1056 06/19/20 1311   06/19/20 1100  metroNIDAZOLE (FLAGYL) IVPB 500 mg        500 mg 100 mL/hr over 60 Minutes Intravenous  Once 06/19/20 1056 06/19/20 1321      Objective: Vitals:   06/24/20 0620 06/24/20 1345 06/24/20 2220 06/25/20 0627  BP: 110/66 106/90    Pulse:  92 89   Resp:  14  20  Temp:  98 F (36.7 C) 98 F (36.7 C) (!) 97.5 F (36.4 C)  TempSrc:  Oral Oral Oral  SpO2:  100% 100% 100%  Weight:    62.6 kg  Height:        Intake/Output Summary (Last 24 hours) at 06/25/2020 0957 Last data filed at 06/25/2020 0600 Gross per 24 hour  Intake 988 ml  Output -  Net 988 ml   Filed Weights   06/22/20 0500 06/24/20 0500 06/25/20 0627  Weight: 59.4 kg 63.6 kg 62.6 kg       Physical Exam:   General:  Appears confused, but more interactive cooperative (especially when his son is present at bedside)  HEENT:  Normocephalic, PERRL, otherwise with in Normal limits   Neuro:   Limited exam CNII-XII intact. , normal motor and sensation, reflexes intact   Lungs:   Clear to auscultation BL, Respirations unlabored, no wheezes / crackles  Cardio:    S1/S2, RRR, No murmure, No Rubs or Gallops   Abdomen:   Soft, non-tender, bowel sounds active all four quadrants,  no guarding or peritoneal signs.  Muscular skeletal:   Severe generalized weaknesses Limited exam - in bed, able to move all 4 extremities, Normal strength,  2+ pulses,  symmetric, +1 pitting edema lower extremities  Skin:  Dry, warm to touch, negative for any Rashes,  Wounds: Please see nursing documentation Pressure Injury 06/20/20 Sacrum Stage 1 -  Intact skin with non-blanchable redness of a localized area usually over a bony prominence. (Active)  06/20/20 1800  Location: Sacrum  Location Orientation:   Staging: Stage 1 -  Intact skin with non-blanchable redness of a localized area usually  over a bony prominence.  Wound Description (Comments):   Present on Admission:               Data Reviewed: I have personally reviewed following labs and imaging studies  CBC: Recent Labs  Lab 06/19/20 0929 06/19/20 1315 06/20/20 0323 06/20/20 1725 06/21/20 2335 06/22/20 0309 06/22/20 0657 06/22/20 1734 06/22/20 2244 06/23/20 0516 06/23/20 0844 06/24/20 0503  WBC 19.6*  --  16.1*  --  17.6* 17.6*  --   --   --  14.0*  --  11.8*  NEUTROABS 15.8*  --   --   --   --   --   --   --   --   --   --   --   HGB 6.3*   < > 8.6*   < > 11.2* 11.3*   < > 11.4* 10.5* 10.7* 10.6* 9.1*  HCT 19.8*   < > 24.9*   < > 32.8* 33.6*   < > 33.9* 31.6* 31.7* 32.4* 27.9*  MCV 90.8  --  86.8  --  87.0 89.8  --   --   --  89.8  --  92.1  PLT 328  --  162  --  126* 132*  --   --   --  130*  --  119*   < > = values in this interval not displayed.   Basic Metabolic Panel: Recent Labs  Lab 06/19/20 1315 06/20/20 0323 06/21/20 2335 06/22/20 0309 06/23/20 0239 06/24/20 0503  NA  --  139 143 145 144 140  K  --  4.3 3.9 3.2* 2.9* 3.1*  CL  --  114* 120* 120* 119* 116*  CO2  --  17* 15* 16* 16* 18*  GLUCOSE  --  96 88 86 123* 131*  BUN  --  76* 46* 44* 30* 22  CREATININE  --  1.74* 1.50* 1.45* 1.34* 1.02*  CALCIUM  --  8.2* 8.1* 8.2* 8.1* 7.9*  MG 2.2  --  1.7  --  1.5*  --   PHOS  --   --  2.5  --  2.1*  --    GFR: Estimated Creatinine Clearance: 26.6 mL/min (A) (by C-G formula based on SCr of 1.02 mg/dL (H)). Liver Function Tests: Recent Labs  Lab 06/19/20 0929 06/23/20 0239  AST 21 27  ALT 15 21  ALKPHOS 47 32*  BILITOT 0.7  1.4*  PROT 6.5 4.6*  ALBUMIN 3.7 2.4*   No results for input(s): LIPASE, AMYLASE in the last 168 hours. No results for input(s): AMMONIA in the last 168 hours. Coagulation Profile: Recent Labs  Lab 06/19/20 0929 06/20/20 0323  INR 1.2 1.3*   Cardiac Enzymes: No results for input(s): CKTOTAL, CKMB, CKMBINDEX, TROPONINI in the last 168 hours. BNP  (last 3 results) No results for input(s): PROBNP in the last 8760 hours. HbA1C: No results for input(s): HGBA1C in the last 72 hours. CBG: Recent Labs  Lab 06/19/20 1006  GLUCAP 146*   Lipid Profile: No results for input(s): CHOL, HDL, LDLCALC, TRIG, CHOLHDL, LDLDIRECT in the last 72 hours. Thyroid Function Tests: No results for input(s): TSH, T4TOTAL, FREET4, T3FREE, THYROIDAB in the last 72 hours. Anemia Panel: No results for input(s): VITAMINB12, FOLATE, FERRITIN, TIBC, IRON, RETICCTPCT in the last 72 hours. Sepsis Labs: Recent Labs  Lab 06/19/20 0929 06/19/20 1148 06/20/20 0323 06/21/20 2335  PROCALCITON  --   --  <0.10  --   LATICACIDVEN 5.1* 4.0*  --  1.3    Recent Results (from the past 240 hour(s))  Urine culture     Status: Abnormal   Collection Time: 06/19/20  9:29 AM   Specimen: In/Out Cath Urine  Result Value Ref Range Status   Specimen Description   Final    IN/OUT CATH URINE Performed at [facility], [address] [place], [place] [address]    Special Requests   Final    NONE Performed at [facility], [address] [place], [place] [address]    Culture 80,000 COLONIES/mL ENTEROCOCCUS FAECIUM (A)  Final   Report Status 06/22/2020 FINAL  Final   Organism ID, Bacteria ENTEROCOCCUS FAECIUM (A)  Final      Susceptibility   Enterococcus faecium - MIC*    AMPICILLIN <=2 SENSITIVE Sensitive     NITROFURANTOIN 64 INTERMEDIATE Intermediate     VANCOMYCIN <=0.5 SENSITIVE Sensitive     * 80,000 COLONIES/mL ENTEROCOCCUS FAECIUM  Resp Panel by RT-PCR (Flu A&B, Covid) Nasopharyngeal Swab     Status: None   Collection Time: 06/19/20  9:31 AM   Specimen: Nasopharyngeal Swab; Nasopharyngeal(NP) swabs in vial transport medium  Result Value Ref Range Status   SARS Coronavirus 2 by RT PCR NEGATIVE NEGATIVE Final    Comment: (NOTE) SARS-CoV-2 target nucleic acids are NOT DETECTED.  The SARS-CoV-2 RNA is generally detectable  in upper respiratory specimens during the acute phase of infection. The lowest concentration of SARS-CoV-2 viral copies this assay can detect is 138 copies/mL. A negative result does not preclude SARS-Cov-2 infection and should not be used as the sole basis for treatment or other patient management decisions. A negative result may occur with  improper specimen collection/handling, submission of specimen other than nasopharyngeal swab, presence of viral mutation(s) within the areas targeted by this assay, and inadequate number of viral copies(<138 copies/mL). A negative result must be combined with clinical observations, patient history, and epidemiological information. The expected result is Negative.  Fact Sheet for Patients:  BloggerCourse.comhttps://www.fda.gov/media/152166/download  Fact Sheet for Healthcare Providers:  SeriousBroker.ithttps://www.fda.gov/media/152162/download  This test is no t yet approved or cleared by the [place] FDA and  has been authorized for detection and/or diagnosis of SARS-CoV-2 by FDA under an Emergency Use Authorization (EUA). This EUA will remain  in effect (meaning this test can be used) for the duration of the COVID-19 declaration under Section 564(b)(1) of the Act, 21 U.S.C.section  360bbb-3(b)(1), unless the authorization is terminated  or revoked sooner.       Influenza A by PCR NEGATIVE NEGATIVE Final   Influenza B by PCR NEGATIVE NEGATIVE Final    Comment: (NOTE) The Xpert Xpress SARS-CoV-2/FLU/RSV plus assay is intended as an aid in the diagnosis of influenza from Nasopharyngeal swab specimens and should not be used as a sole basis for treatment. Nasal washings and aspirates are unacceptable for Xpert Xpress SARS-CoV-2/FLU/RSV testing.  Fact Sheet for Patients: BloggerCourse.com  Fact Sheet for Healthcare Providers: SeriousBroker.it  This test is not yet approved or cleared by the [place] FDA and has  been authorized for detection and/or diagnosis of SARS-CoV-2 by FDA under an Emergency Use Authorization (EUA). This EUA will remain in effect (meaning this test can be used) for the duration of the COVID-19 declaration under Section 564(b)(1) of the Act, 21 U.S.C. section 360bbb-3(b)(1), unless the authorization is terminated or revoked.  Performed at [facility], [address], [place], [place] [address]   Blood Culture (routine x 2)     Status: None   Collection Time: 06/19/20 10:16 AM   Specimen: BLOOD  Result Value Ref Range Status   Specimen Description   Final    BLOOD BLOOD RIGHT HAND Performed at [facility], [address] [place], [place] [address]    Special Requests   Final    BOTTLES DRAWN AEROBIC ONLY Blood Culture results may not be optimal due to an inadequate volume of blood received in culture bottles Performed at [facility], [address] [place], [place] [address]    Culture   Final    NO GROWTH 5 DAYS Performed at [facility], [address] [place], [place] [address]    Report Status 06/24/2020 FINAL  Final  Blood Culture (routine x 2)     Status: None   Collection Time: 06/19/20 11:48 AM   Specimen: BLOOD  Result Value Ref Range Status   Specimen Description   Final    BLOOD LEFT ANTECUBITAL Performed at [facility], [address] [place], [place] [address]    Special Requests   Final    BOTTLES DRAWN AEROBIC AND ANAEROBIC Blood Culture adequate volume Performed at [facility], [address] [place], [place] [address]    Culture   Final    NO GROWTH 5 DAYS Performed at [facility], [address], [place], [place] [address]    Report Status 06/24/2020 FINAL  Final     Radiology Studies: No results found.  Scheduled Meds: . (feeding supplement) PROSource Plus  30 mL Oral BID BM  . acidophilus  2 capsule Oral TID  .  feeding supplement  1 Container Oral TID BM  . feeding supplement  237 mL Oral BID BM  . megestrol  400 mg Oral BID  . multivitamin with minerals  1 tablet Oral Daily  . pantoprazole (PROTONIX) IV  40 mg Intravenous Q12H  . sodium chloride flush  3 mL Intravenous Q12H   Continuous Infusions: . sodium chloride 20 mL/hr at 06/25/20 0355  . ampicillin-sulbactam (UNASYN) IV 3 g (06/25/20 0921)     LOS: 6 days    Time spent: 25 mins    Kendell Bane, MD [facility]   If 7PM-7AM, please contact night-coverage

## 2020-06-25 NOTE — TOC Progression Note (Addendum)
Transition of Care [facility]) - Progression Note    Patient Details  Name: Elizabeth Jarvis MRN: [id number] Date of Birth: Feb 25, 1922  Transition of Care [facility]) CM/SW Contact  Charleigh Correnti, Olegario Messier, RN Phone Number: 06/25/2020, 10:38 AM  Clinical Narrative: Bed offers given to Sandeep(son)-await choice. 3p-Sandeep son chose-[facility]-rep S[facility]-left vm-CM will start auth. Will need covid ordered.   3:26p-Received call back from Eligha Bridegroom rep Jeannine Kitten will contact son Darryll Capers for sign in paperwork-able to accept Monday. Pending auth [id number].faxed clinicals w/confirmation.  Expected Discharge Plan: Skilled Nursing Facility Barriers to Discharge: Continued Medical Work up  Expected Discharge Plan and Services Expected Discharge Plan: Skilled Nursing Facility   Discharge Planning Services: CM Consult Post Acute Care Choice: Skilled Nursing Facility Living arrangements for the past 2 months: Single Family Home                                       Social Determinants of Health (SDOH) Interventions    Readmission Risk Interventions No flowsheet data found.

## 2020-06-25 NOTE — Progress Notes (Signed)
With hearing aid in left ear and google translate on room computer patient nods yes appropriately. Pt hasnt spoken any words this shift.

## 2020-06-25 NOTE — Progress Notes (Signed)
Pharmacy Antibiotic Note  Elizabeth Jarvis is a 87 y.o. female with hx recurrent GIB presented to the ED on 06/19/2020 with c/o generalized weakness and AMS.  Hemeoccult was positive on admission. She's currently on cefepime and flagyl for sepsis for suspected intra-abdominal infection. UCX is positive for enterococcus faecium. Pharmacy was consulted on 2/15 to change abx to unasyn.  Today, 06/25/2020: - day #7 abx, D4 unasyn - afeb, WBC elevated - scr elevated but trending down (1.02, crcl~ 27)  Plan: - continue unasyn 3gm IV q12h - Plan is to transition to augmentin at discharge ___________________________________ Height: 5' (152.4 cm) Weight: 62.6 kg (138 lb 0.1 oz) IBW/kg (Calculated) : 45.5  Temp (24hrs), Avg:97.8 F (36.6 C), Min:97.5 F (36.4 C), Max:98 F (36.7 C)  Recent Labs  Lab 06/19/20 0929 06/19/20 1148 06/20/20 0323 06/21/20 2335 06/22/20 0309 06/23/20 0239 06/23/20 0516 06/24/20 0503  WBC 19.6*  --  16.1* 17.6* 17.6*  --  14.0* 11.8*  CREATININE 2.12*  --  1.74* 1.50* 1.45* 1.34*  --  1.02*  LATICACIDVEN 5.1* 4.0*  --  1.3  --   --   --   --     Estimated Creatinine Clearance: 26.6 mL/min (A) (by C-G formula based on SCr of 1.02 mg/dL (H)).    No Known Allergies  2/12 Cefepime >>2/15 2/12 Metronidazole  >>2/15 2/15 unasyn>>   2/12 COVID: neg 2/12 Influenza A/B: neg/neg 2/12 BCx x2: neg FINAL 2/12 UCx: 80K enterococcus faecium (S= amp, vanc: I= nitro) FINAL   Thank you for allowing pharmacy to be a part of this patient's care.  Lucia Gaskins 06/25/2020 8:52 AM

## 2020-06-25 NOTE — Progress Notes (Signed)
Notified provider on call about patient having two orders for Normal Saline running continuously. Provider on call gave new orders to discontinue the Normal Saline running at 50 mL/hr. Provider on call was also notified for patient having ventricular trigeminy and frequent PVCs.Marland Kitchen

## 2020-06-25 NOTE — TOC Progression Note (Signed)
Transition of Care [facility]) - Progression Note    Patient Details  Name: Elizabeth Jarvis MRN: [id number] Date of Birth: [birth year]  [facility]) CM/SW Contact  Azzan Butler, Olegario Messier, RN Phone Number: 06/25/2020, 4:43 PM  Clinical:   Received referral for residential hospice-spoke to son Sandeep about referral-he is undecided & has lots of questions about residential hospice-CM provided his tel# to [facility] care rep-Chrislyn, & left vm w/[facility] rep Dennard Nip to contact son Darryll Capers YTK#[phone].Eligha Bridegroom is still following-auth has been initiated.    Expected Discharge Plan: Skilled Nursing Facility Barriers to Discharge: English as a second language teacher  Expected Discharge Plan and Services Expected Discharge Plan: Skilled Nursing Facility   Discharge Planning Services: CM Consult Post Acute Care Choice: Skilled Nursing Facility Living arrangements for the past 2 months: Single Family Home                                       Social Determinants of Health (SDOH) Interventions    Readmission Risk Interventions No flowsheet data found.

## 2020-06-25 NOTE — Progress Notes (Signed)
Daily Progress Note   Patient Name: Elizabeth Jarvis       Date: 06/25/2020 DOB: 01/10/1922  Age: 87 y.o. MRN#: [id number] Attending Physician: Kendell Bane, MD Primary Care Physician: Pcp, No Admit Date: 06/19/2020  Reason for Consultation/Follow-up: Establishing goals of care  Subjective: Patient is resting in bed,   son at bedside, Minimal to nil PO intake, no interest in eating, more lethargic since past 24 hours, son has toured few SNFs however he wished to re discuss goals of care with palliative, see below.    Length of Stay: 6  Current Medications: Scheduled Meds:  . (feeding supplement) PROSource Plus  30 mL Oral BID BM  . acidophilus  2 capsule Oral TID  . feeding supplement  1 Container Oral TID BM  . feeding supplement  237 mL Oral BID BM  . megestrol  400 mg Oral BID  . multivitamin with minerals  1 tablet Oral Daily  . pantoprazole  40 mg Oral BID  . sodium chloride flush  3 mL Intravenous Q12H    Continuous Infusions: . sodium chloride 20 mL/hr at 06/25/20 1200  . ampicillin-sulbactam (UNASYN) IV Stopped (06/25/20 0951)    PRN Meds: ondansetron **OR** ondansetron (ZOFRAN) IV  Physical Exam         General: lethargic HEENT: No bruits, no goiter, no JVD Heart: Regular rate and rhythm. No murmur appreciated. Lungs: Good air movement, clear Abdomen: Soft, nontender, nondistended, positive bowel sounds.  Ext: Some edema Skin: Warm and dry Neuro:   Not awake not alert  Vital Signs: BP 113/86 (BP Location: Right Arm)   Pulse 98   Temp (!) 97.4 F (36.3 C) (Oral)   Resp 11   Ht 5' (1.524 m)   Wt 62.6 kg   SpO2 100%   BMI 26.95 kg/m  SpO2: SpO2: 100 % O2 Device: O2 Device: Room Air O2 Flow Rate: O2 Flow Rate (L/min): 2 L/min  Intake/output summary:    Intake/Output Summary (Last 24 hours) at 06/25/2020 1604 Last data filed at 06/25/2020 0951 Gross per 24 hour  Intake 928 ml  Output --  Net 928 ml   LBM: Last BM Date: 06/23/20 Baseline Weight: Weight: 59 kg Most recent weight: Weight: 62.6 kg       Palliative Assessment/Data:    Flowsheet Rows  Flowsheet Row Most Recent Value  Intake Tab   Referral Department Hospitalist  Unit at Time of Referral ER  Palliative Care Primary Diagnosis Other (Comment)  [GI]  Date Notified 06/19/20  Palliative Care Type New Palliative care  Reason for referral Clarify Goals of Care  Date of Admission 06/19/20  Date first seen by Palliative Care 06/20/20  # of days Palliative referral response time 1 Day(s)  # of days IP prior to Palliative referral 0  Clinical Assessment   Palliative Performance Scale Score 20%  Psychosocial & Spiritual Assessment   Palliative Care Outcomes   Patient/Family meeting held? No      Patient Active Problem List   Diagnosis Date Noted  . Palliative care by specialist   . Goals of care, counseling/discussion   . General weakness   . Pressure injury of skin 06/21/2020  . Severe sepsis (HCC) 06/19/2020  . Acute blood loss anemia 06/19/2020  . Acute metabolic encephalopathy 06/19/2020  . Gastrointestinal hemorrhage   . Anemia 05/30/2020  . Failure to thrive in adult 05/30/2020  . Acute lower UTI 01/16/2014  . Acute kidney injury (HCC) 01/13/2014  . HTN (hypertension) 01/13/2014  . Leukocytosis, unspecified 01/13/2014  . Unspecified constipation 01/13/2014  . Decubitus ulcer limited to breakdown of skin (stage 2) (HCC) 01/13/2014  . Obesity (BMI 30-39.9) 01/13/2014  . Acute renal failure (HCC) 01/07/2014  . Hyponatremia 01/07/2014  . Edema of both legs 01/07/2014  . Weakness generalized 01/07/2014  . Urinary retention 01/07/2014  . Abdominal pain 01/07/2014    Palliative Care Assessment & Plan   Patient Profile: 87 year old with severe  anemia/GI bleed  Recommendations/Plan: Goals of care: DNR Residential hospice instead of SNF, discussed in detail with son. Will request TOC input.   End of life signs and symptoms discussed, scope of residential hospice discussed, all of son's concerns addressed to the best of my ability.   Code Status:    Code Status Orders  (From admission, onward)         Start     Ordered   06/19/20 1315  Do not attempt resuscitation (DNR)  Continuous       Question Answer Comment  In the event of cardiac or respiratory ARREST Do not call a "code blue"   In the event of cardiac or respiratory ARREST Do not perform Intubation, CPR, defibrillation or ACLS   In the event of cardiac or respiratory ARREST Use medication by any route, position, wound care, and other measures to relive pain and suffering. May use oxygen, suction and manual treatment of airway obstruction as needed for comfort.      06/19/20 1314        Code Status History    Date Active Date Inactive Code Status Order ID Comments User Context   05/30/2020 2350 06/02/2020 1950 Full Code [id number]  Macon Large, MD Inpatient   01/13/2014 1857 01/16/2014 1933 DNR [id number]  Hollice Espy, MD Inpatient   01/07/2014 2210 01/11/2014 1948 Full Code [id number]  Ron Parker, MD Inpatient   Advance Care Planning Activity      Prognosis: Less than 2 weeks  Discharge Planning: Hospice house.   Care plan was discussed with  Patient , son     Thank you for allowing the Palliative Medicine Team to assist in the care of this patient.   Total Time 35 Prolonged Time Billed No      Greater than 50%  of this time was spent counseling  and coordinating care related to the above assessment and plan.  Loistine Chance, MD  Please contact Palliative Medicine Team phone at [phone] for questions and concerns.

## 2020-06-25 NOTE — Plan of Care (Signed)
  Problem: Coping: Goal: Level of anxiety will decrease Outcome: Progressing   Problem: Elimination: Goal: Will not experience complications related to bowel motility Outcome: Progressing Goal: Will not experience complications related to urinary retention Outcome: Progressing   Problem: Pain Managment: Goal: General experience of comfort will improve Outcome: Progressing   Problem: Safety: Goal: Ability to remain free from injury will improve Outcome: Progressing   Problem: Skin Integrity: Goal: Risk for impaired skin integrity will decrease Outcome: Progressing   Problem: Respiratory: Goal: Ability to maintain adequate ventilation will improve Outcome: Progressing

## 2020-06-25 NOTE — Plan of Care (Deleted)

## 2020-06-26 DIAGNOSIS — R531 Weakness: Secondary | ICD-10-CM | POA: Diagnosis not present

## 2020-06-26 DIAGNOSIS — Z7189 Other specified counseling: Secondary | ICD-10-CM | POA: Diagnosis not present

## 2020-06-26 DIAGNOSIS — A419 Sepsis, unspecified organism: Secondary | ICD-10-CM | POA: Diagnosis not present

## 2020-06-26 DIAGNOSIS — R6520 Severe sepsis without septic shock: Secondary | ICD-10-CM | POA: Diagnosis not present

## 2020-06-26 DIAGNOSIS — Z515 Encounter for palliative care: Secondary | ICD-10-CM | POA: Diagnosis not present

## 2020-06-26 MED ORDER — ACETAMINOPHEN 80 MG PO CHEW
80.0000 mg | CHEWABLE_TABLET | Freq: Four times a day (QID) | ORAL | Status: DC | PRN
  Administered 2020-06-26: 80 mg via ORAL
  Filled 2020-06-26 (×3): qty 1

## 2020-06-26 NOTE — Progress Notes (Signed)
Pt much more interactive and pleasant today. Pt complaint of headache. Chewable grape tylenol given. Pt states she doesn't take anything after 8pm and takes tylenol out of mouth.

## 2020-06-26 NOTE — Progress Notes (Signed)
Daily Progress Note   Patient Name: Elizabeth Jarvis       Date: 06/26/2020 DOB: 1921/06/21  Age: 87 y.o. MRN#: [id number] Attending Physician: Kendell Bane, MD Primary Care Physician: Pcp, No Admit Date: 06/19/2020  Reason for Consultation/Follow-up: Establishing goals of care  Subjective: Patient is resting in bed,  Appears asleep, in on distress, discussed with bedside RN.     Length of Stay: 7  Current Medications: Scheduled Meds:  . (feeding supplement) PROSource Plus  30 mL Oral BID BM  . acidophilus  2 capsule Oral TID  . feeding supplement  1 Container Oral TID BM  . feeding supplement  237 mL Oral BID BM  . megestrol  400 mg Oral BID  . multivitamin with minerals  1 tablet Oral Daily  . pantoprazole  40 mg Oral BID  . sodium chloride flush  3 mL Intravenous Q12H    Continuous Infusions: . sodium chloride 20 mL/hr at 06/26/20 0811  . ampicillin-sulbactam (UNASYN) IV Stopped (06/26/20 1034)    PRN Meds: acetaminophen, ondansetron **OR** ondansetron (ZOFRAN) IV  Physical Exam         General: lethargic HEENT: No bruits, no goiter, no JVD Heart: Regular rate and rhythm. No murmur appreciated. Lungs: Good air movement, clear Abdomen: Soft, nontender, nondistended, positive bowel sounds.  Ext: Some edema Skin: Warm and dry Neuro:   Not awake not alert  Vital Signs: BP (!) 135/117 (BP Location: Right Arm)   Pulse 87   Temp 97.8 F (36.6 C) (Oral)   Resp 14   Ht 5' (1.524 m)   Wt 62.6 kg   SpO2 100%   BMI 26.95 kg/m  SpO2: SpO2: 100 % O2 Device: O2 Device: Room Air O2 Flow Rate: O2 Flow Rate (L/min): 2 L/min  Intake/output summary:   Intake/Output Summary (Last 24 hours) at 06/26/2020 1521 Last data filed at 06/26/2020 1300 Gross per 24 hour  Intake  1007.97 ml  Output --  Net 1007.97 ml   LBM: Last BM Date: 06/25/20 Baseline Weight: Weight: 59 kg Most recent weight: Weight: 62.6 kg       Palliative Assessment/Data:    Flowsheet Rows   Flowsheet Row Most Recent Value  Intake Tab   Referral Department Hospitalist  Unit at Time of Referral ER  Palliative Care Primary Diagnosis  Other (Comment)  [GI]  Date Notified 06/19/20  Palliative Care Type New Palliative care  Reason for referral Clarify Goals of Care  Date of Admission 06/19/20  Date first seen by Palliative Care 06/20/20  # of days Palliative referral response time 1 Day(s)  # of days IP prior to Palliative referral 0  Clinical Assessment   Palliative Performance Scale Score 20%  Psychosocial & Spiritual Assessment   Palliative Care Outcomes   Patient/Family meeting held? No      Patient Active Problem List   Diagnosis Date Noted  . Palliative care by specialist   . Goals of care, counseling/discussion   . General weakness   . Pressure injury of skin 06/21/2020  . Severe sepsis (HCC) 06/19/2020  . Acute blood loss anemia 06/19/2020  . Acute metabolic encephalopathy 06/19/2020  . Gastrointestinal hemorrhage   . Anemia 05/30/2020  . Failure to thrive in adult 05/30/2020  . Acute lower UTI 01/16/2014  . Acute kidney injury (HCC) 01/13/2014  . HTN (hypertension) 01/13/2014  . Leukocytosis, unspecified 01/13/2014  . Unspecified constipation 01/13/2014  . Decubitus ulcer limited to breakdown of skin (stage 2) (HCC) 01/13/2014  . Obesity (BMI 30-39.9) 01/13/2014  . Acute renal failure (HCC) 01/07/2014  . Hyponatremia 01/07/2014  . Edema of both legs 01/07/2014  . Weakness generalized 01/07/2014  . Urinary retention 01/07/2014  . Abdominal pain 01/07/2014    Palliative Care Assessment & Plan   Patient Profile: 87 year old with severe anemia/GI bleed, she has chronic HTN GERD and chronic lower extremity lymphedema, with ongoing subacute global decline.    Recommendations/Plan: Goals of care: DNR Residential hospice instead of SNF, discussed in detail with son on 06-25-20. Appreciate TOC input.   End of life signs and symptoms discussed, scope of residential hospice discussed, all of son's concerns addressed to the best of my ability on 2-18 Add Tylenol PO PRN for headache, monitor patient's PO intake, overall disease trajectory for appropriate disposition planning.   Code Status:    Code Status Orders  (From admission, onward)         Start     Ordered   06/19/20 1315  Do not attempt resuscitation (DNR)  Continuous       Question Answer Comment  In the event of cardiac or respiratory ARREST Do not call a "code blue"   In the event of cardiac or respiratory ARREST Do not perform Intubation, CPR, defibrillation or ACLS   In the event of cardiac or respiratory ARREST Use medication by any route, position, wound care, and other measures to relive pain and suffering. May use oxygen, suction and manual treatment of airway obstruction as needed for comfort.      06/19/20 1314        Code Status History    Date Active Date Inactive Code Status Order ID Comments User Context   05/30/2020 2350 06/02/2020 1950 Full Code [id number]  Macon Large, MD Inpatient   01/13/2014 1857 01/16/2014 1933 DNR [id number]  Hollice Espy, MD Inpatient   01/07/2014 2210 01/11/2014 1948 Full Code [id number]  Ron Parker, MD Inpatient   Advance Care Planning Activity      Prognosis: Less than 2 weeks  Discharge Planning: Hospice house.   Care plan was discussed with RN    Thank you for allowing the Palliative Medicine Team to assist in the care of this patient.   Total Time 15 Prolonged Time Billed No      Greater than 50%  of this time was spent counseling and coordinating care related to the above assessment and plan.  Loistine Chance, MD  Please contact Palliative Medicine Team phone at [phone] for questions and concerns.

## 2020-06-26 NOTE — Progress Notes (Signed)
PROGRESS NOTE    Elizabeth Jarvis  SWF:[id number] DOB: 1921/06/03 DOA: 06/19/2020 PCP: Pcp, No   Brief Narrative:  This 87 years old female with PMH significant for hypertension, GERD, chronic lower extremity lymphedema who was recently hospitalized from 1/23-1/26 for GI bleed and was discharged without intervention. Patient was medically managed during previous hospitalization and was discharged on Protonix and was recommended outpatient follow-up.  Patient has progressive decline in overall health. She presented in the ED with progressive weakness, recurrent GI bleed, altered mentation and syncopal episodes at home in the morning.  Per EMS she was found to be hypotensive with tachycardia and was afebrile. In ED  Lactic acid 5.1, serum creatinine 2.12, WBC 19.6, chest x-ray unremarkable, FOBT positive, CT abdomen: no acute inflammatory process, retained stool in the rectosigmoid, sigmoid diverticulosis.  Patient is admitted for sepsis, started on IV antibiotics, patient was given IV fluids. GI consulted, recommended RBC scan which was negative, underwent EGD which was unremarkable. Patient was given 2 units of packed red blood cells.   Subjective: The patient was seen and examined this morning, awake, not following much commands, minimally verbal cues.  Per nursing staff continue to have poor p.o. intake only with family and encouragement Difficulty sleeping overnight.  Patient still has discussed with palliative, considering hospice home.     Assessment & Plan:   Principal Problem:   Severe sepsis (HCC) Active Problems:   Weakness generalized   Acute kidney injury (HCC)   Anemia   Gastrointestinal hemorrhage   Acute blood loss anemia   Acute metabolic encephalopathy   Pressure injury of skin   Palliative care by specialist   Goals of care, counseling/discussion   General weakness   Severe sepsis, concern for developing shock: -Remained stable -Sepsis physiology-has  resolved  -She was found septic on admission,: sepsis criteria: hypothermia, tachycardia, leukocytosis, lactic acidosis with AKI and soft blood pressures.  - CT abdomen: no acute inflammatory process, retained stool in the rectosigmoid, sigmoid diverticulosis Blood culture: No growth so far.  -Urine culture grew Enterococcus faecalis Antibiotics changed to Unasyn , planning to change to p.o. Augmentin at discharge....  -Continue to have periodic poor p.o. intake, not cooperative with oral meds  Status post IV fluids,  Lactic acid normalized 1.3  DNR/DNI, family patient's son on board  Blood loss anemia secondary to Acute GI bleed: -H&H remained stable will discontinue monitoring -Pursuing comfort care measures  Hb 10.2-> 6.3 in 2 weeks and recently hospitalized for GI bleed without endoscopic intervention. -Status post EGD and biopsy, results negative, no acute findings, also negative for H. pylori Gastroenterology consulted -status post evaluation, EGD - if she bleeds will consider colonoscopy. Patient received 2 units of packed red blood cells,  posttransfusion hemoglobin is 8.6. - Protonix 40 mg IV twice daily, switch to p.o. if tolerated H&H every 6 hours >> since stabilized switch to daily. -RBC scan negative.   GI recommended lifelong PPI,  -Monitoring hemoglobin 10.5, 10.7, 10.6  >> 9.1  Syncope, likely vasovagal versus orthostatic in the setting of anemia Occurred during BM at home and lasted few seconds. Continue telemetry  AKI on CKD 3b: >> Improving Baseline creatinine 1.1, creatinine 1.45>> 1.34 .Marland Kitchen 1.02 today -We will discontinue IV fluids  Failure to thrive, multifactorial: anemia, AKI, sepsis, debility  Elevated troponin: Troponins 40 x 2 flat. suspect secondary to demand ischemia Patient denies any chest pain.   Altered mental status, suspect acute toxic metabolic encephalopathy and possibly hypoperfusion: -Progressively declining Per son  does  understand English language She has been withdrawn--continues to be interactive  Goals of Care: Discussed with nursing staff, SW, Palliative care, Dr. Linna DarnerAnwar and his son  -All are agreed to proceed with hospice.. Requested hospice home Patient son declined SNF with hospice   Status post EGD.   (Previous hospitalist has contacted patient's primary care physician Dr. Georgiann MohsAmit Bertholf in [place]).    Poor appetite We will continue with appetite stimulant Megace, patient prefers home food   Hypokalemia Since patient is having poor p.o. intake we will replete with IV today.   DVT prophylaxis: SCDs Code Status: DNR Family Communication:  Spoke with son Darryll CapersSandeep ... Agreeable to current plan Disposition Plan:   Status is: Inpatient  Remains inpatient appropriate because:Inpatient level of care appropriate due to severity of illness   Dispo: The patient is from: Home              Anticipated d/c is to: SNF-with palliative care              Anticipated d/c date is: 1-2 days              Patient currently is not medically stable to d/c.   Difficult to place patient No   Difficulty discharge due to waxing and waning of p.o. intake, including medications, antibiotics   Consultants:   Gastroenterology  Palliative care following  Procedures:  Antimicrobials:  Anti-infectives (From admission, onward)   Start     Dose/Rate Route Frequency Ordered Stop   06/22/20 0900  Ampicillin-Sulbactam (UNASYN) 3 g in sodium chloride 0.9 % 100 mL IVPB        3 g 200 mL/hr over 30 Minutes Intravenous Every 12 hours 06/22/20 0832     06/20/20 1000  ceFEPIme (MAXIPIME) 2 g in sodium chloride 0.9 % 100 mL IVPB  Status:  Discontinued        2 g 200 mL/hr over 30 Minutes Intravenous Every 24 hours 06/19/20 1248 06/22/20 0832   06/19/20 1315  ceFEPIme (MAXIPIME) 2 g in sodium chloride 0.9 % 100 mL IVPB  Status:  Discontinued        2 g 200 mL/hr over 30 Minutes Intravenous  Once 06/19/20 1314 06/19/20  1323   06/19/20 1315  metroNIDAZOLE (FLAGYL) IVPB 500 mg  Status:  Discontinued        500 mg 100 mL/hr over 60 Minutes Intravenous Every 8 hours 06/19/20 1314 06/22/20 0832   06/19/20 1100  ceFEPIme (MAXIPIME) 2 g in sodium chloride 0.9 % 100 mL IVPB        2 g 200 mL/hr over 30 Minutes Intravenous  Once 06/19/20 1056 06/19/20 1311   06/19/20 1100  metroNIDAZOLE (FLAGYL) IVPB 500 mg        500 mg 100 mL/hr over 60 Minutes Intravenous  Once 06/19/20 1056 06/19/20 1321      Objective: Vitals:   06/25/20 0627 06/25/20 1100 06/25/20 2112 06/26/20 0522  BP:  113/86  134/66  Pulse:  98 89 80  Resp: 20 11  20   Temp: (!) 97.5 F (36.4 C) (!) 97.4 F (36.3 C)  (!) 97.5 F (36.4 C)  TempSrc: Oral Oral Oral Oral  SpO2: 100%  100% 100%  Weight: 62.6 kg     Height:        Intake/Output Summary (Last 24 hours) at 06/26/2020 1204 Last data filed at 06/26/2020 0900 Gross per 24 hour  Intake 907.97 ml  Output --  Net 907.97 ml  Filed Weights   06/22/20 0500 06/24/20 0500 06/25/20 0627  Weight: 59.4 kg 63.6 kg 62.6 kg       Physical Exam:   General:   Awake, very confused today  HEENT:  Normocephalic, PERRL, otherwise with in Normal limits   Neuro:  CNII-XII intact. , normal motor and sensation, reflexes intact   Lungs:   Clear to auscultation BL, Respirations unlabored, no wheezes / crackles  Cardio:    S1/S2, RRR, No murmure, No Rubs or Gallops   Abdomen:   Soft, non-tender, bowel sounds active all four quadrants,  no guarding or peritoneal signs.  Muscular skeletal:  Limited exam - in bed, able to move all 4 extremities, Normal strength,  2+ pulses,  symmetric, No pitting edema  Skin:  Dry, warm to touch, negative for any Rashes,  Wounds: Please see nursing documentation Pressure Injury 06/20/20 Sacrum Stage 1 -  Intact skin with non-blanchable redness of a localized area usually over a bony prominence. (Active)  06/20/20 1800  Location: Sacrum  Location Orientation:    Staging: Stage 1 -  Intact skin with non-blanchable redness of a localized area usually over a bony prominence.  Wound Description (Comments):   Present on Admission:              Data Reviewed: I have personally reviewed following labs and imaging studies  CBC: Recent Labs  Lab 06/20/20 0323 06/20/20 1725 06/21/20 2335 06/22/20 0309 06/22/20 0657 06/22/20 1734 06/22/20 2244 06/23/20 0516 06/23/20 0844 06/24/20 0503  WBC 16.1*  --  17.6* 17.6*  --   --   --  14.0*  --  11.8*  HGB 8.6*   < > 11.2* 11.3*   < > 11.4* 10.5* 10.7* 10.6* 9.1*  HCT 24.9*   < > 32.8* 33.6*   < > 33.9* 31.6* 31.7* 32.4* 27.9*  MCV 86.8  --  87.0 89.8  --   --   --  89.8  --  92.1  PLT 162  --  126* 132*  --   --   --  130*  --  119*   < > = values in this interval not displayed.   Basic Metabolic Panel: Recent Labs  Lab 06/19/20 1315 06/20/20 0323 06/21/20 2335 06/22/20 0309 06/23/20 0239 06/24/20 0503  NA  --  139 143 145 144 140  K  --  4.3 3.9 3.2* 2.9* 3.1*  CL  --  114* 120* 120* 119* 116*  CO2  --  17* 15* 16* 16* 18*  GLUCOSE  --  96 88 86 123* 131*  BUN  --  76* 46* 44* 30* 22  CREATININE  --  1.74* 1.50* 1.45* 1.34* 1.02*  CALCIUM  --  8.2* 8.1* 8.2* 8.1* 7.9*  MG 2.2  --  1.7  --  1.5*  --   PHOS  --   --  2.5  --  2.1*  --    GFR: Estimated Creatinine Clearance: 26.6 mL/min (A) (by C-G formula based on SCr of 1.02 mg/dL (H)). Liver Function Tests: Recent Labs  Lab 06/23/20 0239  AST 27  ALT 21  ALKPHOS 32*  BILITOT 1.4*  PROT 4.6*  ALBUMIN 2.4*   No results for input(s): LIPASE, AMYLASE in the last 168 hours. No results for input(s): AMMONIA in the last 168 hours. Coagulation Profile: Recent Labs  Lab 06/20/20 0323  INR 1.3*   Cardiac Enzymes: No results for input(s): CKTOTAL, CKMB, CKMBINDEX, TROPONINI in the last 168  hours. BNP (last 3 results) No results for input(s): PROBNP in the last 8760 hours. HbA1C: No results for input(s): HGBA1C in the last  72 hours. CBG: No results for input(s): GLUCAP in the last 168 hours. Lipid Profile: No results for input(s): CHOL, HDL, LDLCALC, TRIG, CHOLHDL, LDLDIRECT in the last 72 hours. Thyroid Function Tests: No results for input(s): TSH, T4TOTAL, FREET4, T3FREE, THYROIDAB in the last 72 hours. Anemia Panel: No results for input(s): VITAMINB12, FOLATE, FERRITIN, TIBC, IRON, RETICCTPCT in the last 72 hours. Sepsis Labs: Recent Labs  Lab 06/20/20 0323 06/21/20 2335  PROCALCITON <0.10  --   LATICACIDVEN  --  1.3    Recent Results (from the past 240 hour(s))  Urine culture     Status: Abnormal   Collection Time: 06/19/20  9:29 AM   Specimen: In/Out Cath Urine  Result Value Ref Range Status   Specimen Description   Final    IN/OUT CATH URINE Performed at [facility], [address], [place], [place] [address]    Special Requests   Final    NONE Performed at [facility], [address], [place], [place] [address]    Culture 80,000 COLONIES/mL ENTEROCOCCUS FAECIUM (A)  Final   Report Status 06/22/2020 FINAL  Final   Organism ID, Bacteria ENTEROCOCCUS FAECIUM (A)  Final      Susceptibility   Enterococcus faecium - MIC*    AMPICILLIN <=2 SENSITIVE Sensitive     NITROFURANTOIN 64 INTERMEDIATE Intermediate     VANCOMYCIN <=0.5 SENSITIVE Sensitive     * 80,000 COLONIES/mL ENTEROCOCCUS FAECIUM  Resp Panel by RT-PCR (Flu A&B, Covid) Nasopharyngeal Swab     Status: None   Collection Time: 06/19/20  9:31 AM   Specimen: Nasopharyngeal Swab; Nasopharyngeal(NP) swabs in vial transport medium  Result Value Ref Range Status   SARS Coronavirus 2 by RT PCR NEGATIVE NEGATIVE Final    Comment: (NOTE) SARS-CoV-2 target nucleic acids are NOT DETECTED.  The SARS-CoV-2 RNA is generally detectable in upper respiratory specimens during the acute phase of infection. The lowest concentration of SARS-CoV-2 viral copies this assay can detect is 138 copies/mL. A  negative result does not preclude SARS-Cov-2 infection and should not be used as the sole basis for treatment or other patient management decisions. A negative result may occur with  improper specimen collection/handling, submission of specimen other than nasopharyngeal swab, presence of viral mutation(s) within the areas targeted by this assay, and inadequate number of viral copies(<138 copies/mL). A negative result must be combined with clinical observations, patient history, and epidemiological information. The expected result is Negative.  Fact Sheet for Patients:  BloggerCourse.com  Fact Sheet for Healthcare Providers:  SeriousBroker.it  This test is no t yet approved or cleared by the [place] FDA and  has been authorized for detection and/or diagnosis of SARS-CoV-2 by FDA under an Emergency Use Authorization (EUA). This EUA will remain  in effect (meaning this test can be used) for the duration of the COVID-19 declaration under Section 564(b)(1) of the Act, 21 U.S.C.section 360bbb-3(b)(1), unless the authorization is terminated  or revoked sooner.       Influenza A by PCR NEGATIVE NEGATIVE Final   Influenza B by PCR NEGATIVE NEGATIVE Final    Comment: (NOTE) The Xpert Xpress SARS-CoV-2/FLU/RSV plus assay is intended as an aid in the diagnosis of influenza from Nasopharyngeal swab specimens and should not be used as a sole basis for treatment. Nasal washings and aspirates are unacceptable for  Xpert Xpress SARS-CoV-2/FLU/RSV testing.  Fact Sheet for Patients: BloggerCourse.com  Fact Sheet for Healthcare Providers: SeriousBroker.it  This test is not yet approved or cleared by the [place] FDA and has been authorized for detection and/or diagnosis of SARS-CoV-2 by FDA under an Emergency Use Authorization (EUA). This EUA will remain in effect (meaning this test can  be used) for the duration of the COVID-19 declaration under Section 564(b)(1) of the Act, 21 U.S.C. section 360bbb-3(b)(1), unless the authorization is terminated or revoked.  Performed at [facility], [address] [place], [place] [address]   Blood Culture (routine x 2)     Status: None   Collection Time: 06/19/20 10:16 AM   Specimen: BLOOD  Result Value Ref Range Status   Specimen Description   Final    BLOOD BLOOD RIGHT HAND Performed at [facility], [address] [place], [place] [address]    Special Requests   Final    BOTTLES DRAWN AEROBIC ONLY Blood Culture results may not be optimal due to an inadequate volume of blood received in culture bottles Performed at [facility], [address] [place], [place] [address]    Culture   Final    NO GROWTH 5 DAYS Performed at [facility], [address] [place], [place] [address]    Report Status 06/24/2020 FINAL  Final  Blood Culture (routine x 2)     Status: None   Collection Time: 06/19/20 11:48 AM   Specimen: BLOOD  Result Value Ref Range Status   Specimen Description   Final    BLOOD LEFT ANTECUBITAL Performed at [facility], [address], [place], [place] [address]    Special Requests   Final    BOTTLES DRAWN AEROBIC AND ANAEROBIC Blood Culture adequate volume Performed at [facility], [address], [place], [place] [address]    Culture   Final    NO GROWTH 5 DAYS Performed at [facility], [address], [place], [place] [address]    Report Status 06/24/2020 FINAL  Final     Radiology Studies: No results found.  Scheduled Meds: . (feeding supplement) PROSource Plus  30 mL Oral BID BM  . acidophilus  2 capsule Oral TID  . feeding supplement  1 Container Oral TID BM  . feeding supplement  237 mL Oral BID BM  . megestrol  400 mg Oral BID  . multivitamin with minerals  1 tablet Oral Daily  .  pantoprazole  40 mg Oral BID  . sodium chloride flush  3 mL Intravenous Q12H   Continuous Infusions: . sodium chloride 20 mL/hr at 06/26/20 0811  . ampicillin-sulbactam (UNASYN) IV 3 g (06/26/20 0841)     LOS: 7 days    Time spent: 25 mins    Kendell Bane, MD [facility]   If 7PM-7AM, please contact night-coverage

## 2020-06-26 NOTE — TOC Progression Note (Signed)
Transition of Care [facility]) - Progression Note    Patient Details  Name: Elizabeth Jarvis MRN: [id number] Date of Birth: October 28, 1921  Transition of Care [facility]) CM/SW Contact  Armanda Heritage, RN Phone Number: 06/26/2020, 2:08 PM  Clinical Narrative:    Authorization approved for 5 days, starting 2/18 until 2/22.  Auth id # [id number].  Expected Discharge Plan: Skilled Nursing Facility Barriers to Discharge: English as a second language teacher  Expected Discharge Plan and Services Expected Discharge Plan: Skilled Nursing Facility   Discharge Planning Services: CM Consult Post Acute Care Choice: Skilled Nursing Facility Living arrangements for the past 2 months: Single Family Home                                       Social Determinants of Health (SDOH) Interventions    Readmission Risk Interventions No flowsheet data found.

## 2020-06-26 NOTE — Progress Notes (Signed)
Civil engineer, contracting [facility])  Spoke with Mr. Bieda, son of patient, at length regarding different levels of hospice care (at home versus residential at [facility]).   He feels that his mother is making the decisions for him in regards to pursuing LTC/SNF or residential hospice, as she is not eating and primarily sleeping.  He has many questions regarding paying for whichever avenue he pursues. Advised him that based on the information he is providing this Clinical research associate that it does seem as if hospice would be appropriate.  Spent much time discussing next steps, which would be having my attending at hospice agree with a dx of < 2 weeks for [facility], as he cannot care for her any longer at home. Advised to go ahead and start this process and he declined for now, as he wants to confer with his family/PCP.    Answered many questions, provided support and discussed hospice philosophy. Pt PCP is her cousin in [place] that the family wants to speak with again for further direction. Mr. Wrisley advised he is considering other options as well. He has our contact number and will reach back out to [place] after he speaks with Ms. Boultinghouse's PCP.  Thank you, Wallis Bamberg RN, BSN, CCRN [employer] Liaison

## 2020-06-27 DIAGNOSIS — A419 Sepsis, unspecified organism: Secondary | ICD-10-CM | POA: Diagnosis not present

## 2020-06-27 DIAGNOSIS — R6520 Severe sepsis without septic shock: Secondary | ICD-10-CM | POA: Diagnosis not present

## 2020-06-27 MED ORDER — AMOXICILLIN-POT CLAVULANATE 500-125 MG PO TABS
1.0000 | ORAL_TABLET | Freq: Two times a day (BID) | ORAL | Status: DC
  Administered 2020-06-27 – 2020-06-28 (×3): 500 mg via ORAL
  Filled 2020-06-27 (×4): qty 1

## 2020-06-27 MED ORDER — POLYVINYL ALCOHOL 1.4 % OP SOLN
1.0000 [drp] | Freq: Four times a day (QID) | OPHTHALMIC | Status: DC | PRN
  Filled 2020-06-27: qty 15

## 2020-06-27 NOTE — Progress Notes (Signed)
PROGRESS NOTE    Elizabeth Jarvis  NWG:[id number] DOB: Jun 22, 1921 DOA: 06/19/2020 PCP: Pcp, No   Brief Narrative:  This 87 years old female with PMH significant for hypertension, GERD, chronic lower extremity lymphedema who was recently hospitalized from 1/23-1/26 for GI bleed and was discharged without intervention. Patient was medically managed during previous hospitalization and was discharged on Protonix and was recommended outpatient follow-up.  Patient has progressive decline in overall health. She presented in the ED with progressive weakness, recurrent GI bleed, altered mentation and syncopal episodes at home in the morning.  Per EMS she was found to be hypotensive with tachycardia and was afebrile. In ED  Lactic acid 5.1, serum creatinine 2.12, WBC 19.6, chest x-ray unremarkable, FOBT positive, CT abdomen: no acute inflammatory process, retained stool in the rectosigmoid, sigmoid diverticulosis.  Patient is admitted for sepsis, started on IV antibiotics, patient was given IV fluids. GI consulted, recommended RBC scan which was negative, underwent EGD which was unremarkable. Patient was given 2 units of packed red blood cells.   Subjective: The patient was seen and examined this morning much more interactive, verbal, following some commands.   Ongoing discussion with family in agreement for possible SNF and hospice     Assessment & Plan:   Principal Problem:   Severe sepsis (HCC) Active Problems:   Weakness generalized   Acute kidney injury (HCC)   Anemia   Gastrointestinal hemorrhage   Acute blood loss anemia   Acute metabolic encephalopathy   Pressure injury of skin   Palliative care by specialist   Goals of care, counseling/discussion   General weakness   Severe sepsis, concern for developing shock: -Remained stable -Sepsis physiology-has resolved  -She was found septic on admission,: sepsis criteria: hypothermia, tachycardia, leukocytosis, lactic acidosis with AKI and  soft blood pressures.  - CT abdomen: no acute inflammatory process, retained stool in the rectosigmoid, sigmoid diverticulosis Blood culture: No growth so far.  -Urine culture grew Enterococcus faecalis >> IV Unasyn >> changed to p.o. Augmentin to 06/27/20 (As patient seems to be tolerating some p.o. now)   Status post IV fluids,  Lactic acid normalized 1.3  DNR/DNI, family patient's son on board  Blood loss anemia secondary to Acute GI bleed: -H&H has been stable--will not follow any further -Pursuing comfort care measures  Hb 10.2-> 6.3 in 2 weeks and recently hospitalized for GI bleed without endoscopic intervention. -Status post EGD and biopsy, results negative, no acute findings, also negative for H. pylori Gastroenterology consulted -status post evaluation, EGD - if she bleeds will consider colonoscopy. Patient received 2 units of packed red blood cells,  posttransfusion hemoglobin is 8.6. - Protonix 40 mg IV twice daily, switch to p.o. if tolerated H&H every 6 hours >> since stabilized switch to daily. -RBC scan negative.   GI recommended lifelong PPI,  -Monitoring hemoglobin 10.5, 10.7, 10.6  >> 9.1 ..   Syncope, likely vasovagal versus orthostatic in the setting of anemia Occurred during BM at home and lasted few seconds. -We will discontinue telemetry  AKI on CKD 3b: >> Improving Baseline creatinine 1.1, creatinine 1.45>> 1.34 .Marland Kitchen 1.02 today -We will discontinue IV fluids, will encourage p.o. intake  Failure to thrive, multifactorial: anemia, AKI, sepsis, debility  Elevated troponin: - Troponins 40 x 2 flat. suspect secondary to demand ischemia,no chest pain    Altered mental status, suspect acute toxic metabolic encephalopathy and possibly hypoperfusion: -Mental status continues to wax and wane, ... more interactive today Per son does understand [place] language  She has been withdrawn--continues to be interactive  Goals of Care: Discussed with nursing  staff, SW, Palliative care, Dr. Linna Darner and his son  -All are agreed to proceed with hospice.. Requested hospice home Patient son declined SNF with hospice   Status post EGD.   (Previous hospitalist has contacted patient's primary care physician Dr. Georgiann Mohs in [place]).    Poor appetite We will continue with appetite stimulant Megace, patient prefers home food   Hypokalemia Since patient is having poor p.o. intake we will replete with IV today.   DVT prophylaxis: SCDs Code Status: DNR Family Communication:  Spoke with son Darryll Capers ... Agreeable to current plan Disposition Plan:   Status is: Inpatient  Remains inpatient appropriate because:Inpatient level of care appropriate due to severity of illness   Dispo: The patient is from: Home              Anticipated d/c is to: SNF-with palliative care              Anticipated d/c date is: 1-2 days              Patient currently is not medically stable to d/c.   Difficult to place patient No   Difficulty discharge due to waxing and waning of p.o. intake, including medications, antibiotics   Consultants:   Gastroenterology  Palliative care following  Procedures:  Antimicrobials:  Anti-infectives (From admission, onward)   Start     Dose/Rate Route Frequency Ordered Stop   06/27/20 1000  amoxicillin-clavulanate (AUGMENTIN) 500-125 MG per tablet 500 mg        1 tablet Oral 2 times daily 06/27/20 0926     06/22/20 0900  Ampicillin-Sulbactam (UNASYN) 3 g in sodium chloride 0.9 % 100 mL IVPB  Status:  Discontinued        3 g 200 mL/hr over 30 Minutes Intravenous Every 12 hours 06/22/20 0832 06/27/20 0924   06/20/20 1000  ceFEPIme (MAXIPIME) 2 g in sodium chloride 0.9 % 100 mL IVPB  Status:  Discontinued        2 g 200 mL/hr over 30 Minutes Intravenous Every 24 hours 06/19/20 1248 06/22/20 0832   06/19/20 1315  ceFEPIme (MAXIPIME) 2 g in sodium chloride 0.9 % 100 mL IVPB  Status:  Discontinued        2 g 200 mL/hr over 30  Minutes Intravenous  Once 06/19/20 1314 06/19/20 1323   06/19/20 1315  metroNIDAZOLE (FLAGYL) IVPB 500 mg  Status:  Discontinued        500 mg 100 mL/hr over 60 Minutes Intravenous Every 8 hours 06/19/20 1314 06/22/20 0832   06/19/20 1100  ceFEPIme (MAXIPIME) 2 g in sodium chloride 0.9 % 100 mL IVPB        2 g 200 mL/hr over 30 Minutes Intravenous  Once 06/19/20 1056 06/19/20 1311   06/19/20 1100  metroNIDAZOLE (FLAGYL) IVPB 500 mg        500 mg 100 mL/hr over 60 Minutes Intravenous  Once 06/19/20 1056 06/19/20 1321      Objective: Vitals:   06/26/20 2024 06/27/20 0502 06/27/20 0503 06/27/20 0507  BP: (!) 121/92 117/62 117/62   Pulse: (!) 110 85 70   Resp: 20 18 18    Temp: 98 F (36.7 C) 98.6 F (37 C) 98.6 F (37 C)   TempSrc:   Oral   SpO2: 100% 100% 100%   Weight:    59.4 kg  Height:        Intake/Output  Summary (Last 24 hours) at 06/27/2020 1059 Last data filed at 06/27/2020 0818 Gross per 24 hour  Intake 823.3 ml  Output -  Net 823.3 ml   Filed Weights   06/24/20 0500 06/25/20 0627 06/27/20 0507  Weight: 63.6 kg 62.6 kg 59.4 kg        Physical Exam:   General:   Confused, but verbal and more interactive today  HEENT:  Normocephalic, PERRL, otherwise with in Normal limits   Neuro:  CNII-XII intact. , normal motor and sensation, reflexes intact   Lungs:   Clear to auscultation BL, Respirations unlabored, no wheezes / crackles  Cardio:    S1/S2, RRR, No murmure, No Rubs or Gallops   Abdomen:   Soft, non-tender, bowel sounds active all four quadrants,  no guarding or peritoneal signs.  Muscular skeletal:  Limited exam - in bed, able to move all 4 extremities, Normal strength,  2+ pulses,  symmetric, No pitting edema  Skin:  Dry, warm to touch, negative for any Rashes,  Wounds: Please see nursing documentation Pressure Injury 06/20/20 Sacrum Stage 1 -  Intact skin with non-blanchable redness of a localized area usually over a bony prominence. (Active)  06/20/20  1800  Location: Sacrum  Location Orientation:   Staging: Stage 1 -  Intact skin with non-blanchable redness of a localized area usually over a bony prominence.  Wound Description (Comments):   Present on Admission:              Data Reviewed: I have personally reviewed following labs and imaging studies  CBC: Recent Labs  Lab 06/21/20 2335 06/22/20 0309 06/22/20 0657 06/22/20 1734 06/22/20 2244 06/23/20 0516 06/23/20 0844 06/24/20 0503  WBC 17.6* 17.6*  --   --   --  14.0*  --  11.8*  HGB 11.2* 11.3*   < > 11.4* 10.5* 10.7* 10.6* 9.1*  HCT 32.8* 33.6*   < > 33.9* 31.6* 31.7* 32.4* 27.9*  MCV 87.0 89.8  --   --   --  89.8  --  92.1  PLT 126* 132*  --   --   --  130*  --  119*   < > = values in this interval not displayed.   Basic Metabolic Panel: Recent Labs  Lab 06/21/20 2335 06/22/20 0309 06/23/20 0239 06/24/20 0503  NA 143 145 144 140  K 3.9 3.2* 2.9* 3.1*  CL 120* 120* 119* 116*  CO2 15* 16* 16* 18*  GLUCOSE 88 86 123* 131*  BUN 46* 44* 30* 22  CREATININE 1.50* 1.45* 1.34* 1.02*  CALCIUM 8.1* 8.2* 8.1* 7.9*  MG 1.7  --  1.5*  --   PHOS 2.5  --  2.1*  --    GFR: Estimated Creatinine Clearance: 26 mL/min (A) (by C-G formula based on SCr of 1.02 mg/dL (H)). Liver Function Tests: Recent Labs  Lab 06/23/20 0239  AST 27  ALT 21  ALKPHOS 32*  BILITOT 1.4*  PROT 4.6*  ALBUMIN 2.4*  Sepsis Labs: Recent Labs  Lab 06/21/20 2335  LATICACIDVEN 1.3    Recent Results (from the past 240 hour(s))  Urine culture     Status: Abnormal   Collection Time: 06/19/20  9:29 AM   Specimen: In/Out Cath Urine  Result Value Ref Range Status   Specimen Description   Final    IN/OUT CATH URINE Performed at [facility], [address], [place], [place] [address]    Special Requests   Final  NONE Performed at [facility], [address] [place], [place] [address]    Culture 80,000 COLONIES/mL ENTEROCOCCUS FAECIUM (A)   Final   Report Status 06/22/2020 FINAL  Final   Organism ID, Bacteria ENTEROCOCCUS FAECIUM (A)  Final      Susceptibility   Enterococcus faecium - MIC*    AMPICILLIN <=2 SENSITIVE Sensitive     NITROFURANTOIN 64 INTERMEDIATE Intermediate     VANCOMYCIN <=0.5 SENSITIVE Sensitive     * 80,000 COLONIES/mL ENTEROCOCCUS FAECIUM  Resp Panel by RT-PCR (Flu A&B, Covid) Nasopharyngeal Swab     Status: None   Collection Time: 06/19/20  9:31 AM   Specimen: Nasopharyngeal Swab; Nasopharyngeal(NP) swabs in vial transport medium  Result Value Ref Range Status   SARS Coronavirus 2 by RT PCR NEGATIVE NEGATIVE Final    Comment: (NOTE) SARS-CoV-2 target nucleic acids are NOT DETECTED.  The SARS-CoV-2 RNA is generally detectable in upper respiratory specimens during the acute phase of infection. The lowest concentration of SARS-CoV-2 viral copies this assay can detect is 138 copies/mL. A negative result does not preclude SARS-Cov-2 infection and should not be used as the sole basis for treatment or other patient management decisions. A negative result may occur with  improper specimen collection/handling, submission of specimen other than nasopharyngeal swab, presence of viral mutation(s) within the areas targeted by this assay, and inadequate number of viral copies(<138 copies/mL). A negative result must be combined with clinical observations, patient history, and epidemiological information. The expected result is Negative.  Fact Sheet for Patients:  BloggerCourse.comhttps://www.fda.gov/media/152166/download  Fact Sheet for Healthcare Providers:  SeriousBroker.ithttps://www.fda.gov/media/152162/download  This test is no t yet approved or cleared by the [place] FDA and  has been authorized for detection and/or diagnosis of SARS-CoV-2 by FDA under an Emergency Use Authorization (EUA). This EUA will remain  in effect (meaning this test can be used) for the duration of the COVID-19 declaration under Section 564(b)(1) of the  Act, 21 U.S.C.section 360bbb-3(b)(1), unless the authorization is terminated  or revoked sooner.       Influenza A by PCR NEGATIVE NEGATIVE Final   Influenza B by PCR NEGATIVE NEGATIVE Final    Comment: (NOTE) The Xpert Xpress SARS-CoV-2/FLU/RSV plus assay is intended as an aid in the diagnosis of influenza from Nasopharyngeal swab specimens and should not be used as a sole basis for treatment. Nasal washings and aspirates are unacceptable for Xpert Xpress SARS-CoV-2/FLU/RSV testing.  Fact Sheet for Patients: BloggerCourse.comhttps://www.fda.gov/media/152166/download  Fact Sheet for Healthcare Providers: SeriousBroker.ithttps://www.fda.gov/media/152162/download  This test is not yet approved or cleared by the [place] FDA and has been authorized for detection and/or diagnosis of SARS-CoV-2 by FDA under an Emergency Use Authorization (EUA). This EUA will remain in effect (meaning this test can be used) for the duration of the COVID-19 declaration under Section 564(b)(1) of the Act, 21 U.S.C. section 360bbb-3(b)(1), unless the authorization is terminated or revoked.  Performed at [facility], [address] [place], [place] [address]   Blood Culture (routine x 2)     Status: None   Collection Time: 06/19/20 10:16 AM   Specimen: BLOOD  Result Value Ref Range Status   Specimen Description   Final    BLOOD BLOOD RIGHT HAND Performed at [facility], [address] [place], [place] [address]    Special Requests   Final    BOTTLES DRAWN AEROBIC ONLY Blood Culture results may not be optimal due to an inadequate volume of blood received  in culture bottles Performed at [facility], [address] [place], [place] [address]    Culture   Final    NO GROWTH 5 DAYS Performed at [facility], [address], [place], [place] [address]    Report Status 06/24/2020 FINAL  Final  Blood Culture (routine x 2)     Status: None   Collection Time:  06/19/20 11:48 AM   Specimen: BLOOD  Result Value Ref Range Status   Specimen Description   Final    BLOOD LEFT ANTECUBITAL Performed at [facility], [address] [place], [place] [address]    Special Requests   Final    BOTTLES DRAWN AEROBIC AND ANAEROBIC Blood Culture adequate volume Performed at [facility], [address] [place], [place] [address]    Culture   Final    NO GROWTH 5 DAYS Performed at [facility], [address] [place], [place] [address]    Report Status 06/24/2020 FINAL  Final     Radiology Studies: No results found.  Scheduled Meds: . (feeding supplement) PROSource Plus  30 mL Oral BID BM  . acidophilus  2 capsule Oral TID  . amoxicillin-clavulanate  1 tablet Oral BID  . feeding supplement  1 Container Oral TID BM  . feeding supplement  237 mL Oral BID BM  . megestrol  400 mg Oral BID  . multivitamin with minerals  1 tablet Oral Daily  . pantoprazole  40 mg Oral BID  . sodium chloride flush  3 mL Intravenous Q12H   Continuous Infusions: . sodium chloride 20 mL/hr at 06/26/20 1613     LOS: 8 days    Time spent: 25 mins    Kendell Bane, MD [facility]   If 7PM-7AM, please contact night-coverage

## 2020-06-28 DIAGNOSIS — Z7401 Bed confinement status: Secondary | ICD-10-CM | POA: Diagnosis not present

## 2020-06-28 DIAGNOSIS — R2681 Unsteadiness on feet: Secondary | ICD-10-CM | POA: Diagnosis not present

## 2020-06-28 DIAGNOSIS — R55 Syncope and collapse: Secondary | ICD-10-CM | POA: Diagnosis not present

## 2020-06-28 DIAGNOSIS — I10 Essential (primary) hypertension: Secondary | ICD-10-CM | POA: Diagnosis not present

## 2020-06-28 DIAGNOSIS — M6281 Muscle weakness (generalized): Secondary | ICD-10-CM | POA: Diagnosis not present

## 2020-06-28 DIAGNOSIS — A419 Sepsis, unspecified organism: Secondary | ICD-10-CM | POA: Diagnosis not present

## 2020-06-28 DIAGNOSIS — N179 Acute kidney failure, unspecified: Secondary | ICD-10-CM | POA: Diagnosis not present

## 2020-06-28 DIAGNOSIS — R1312 Dysphagia, oropharyngeal phase: Secondary | ICD-10-CM | POA: Diagnosis not present

## 2020-06-28 DIAGNOSIS — M2550 Pain in unspecified joint: Secondary | ICD-10-CM | POA: Diagnosis not present

## 2020-06-28 DIAGNOSIS — Z743 Need for continuous supervision: Secondary | ICD-10-CM | POA: Diagnosis not present

## 2020-06-28 DIAGNOSIS — D649 Anemia, unspecified: Secondary | ICD-10-CM | POA: Diagnosis not present

## 2020-06-28 DIAGNOSIS — D62 Acute posthemorrhagic anemia: Secondary | ICD-10-CM | POA: Diagnosis not present

## 2020-06-28 DIAGNOSIS — K922 Gastrointestinal hemorrhage, unspecified: Secondary | ICD-10-CM | POA: Diagnosis not present

## 2020-06-28 DIAGNOSIS — R404 Transient alteration of awareness: Secondary | ICD-10-CM | POA: Diagnosis not present

## 2020-06-28 DIAGNOSIS — R627 Adult failure to thrive: Secondary | ICD-10-CM | POA: Diagnosis not present

## 2020-06-28 DIAGNOSIS — N390 Urinary tract infection, site not specified: Secondary | ICD-10-CM | POA: Diagnosis not present

## 2020-06-28 DIAGNOSIS — R6520 Severe sepsis without septic shock: Secondary | ICD-10-CM | POA: Diagnosis not present

## 2020-06-28 LAB — SARS CORONAVIRUS 2 (TAT 6-24 HRS): SARS Coronavirus 2: NEGATIVE

## 2020-06-28 MED ORDER — RISAQUAD PO CAPS: 2.0000 | ORAL_CAPSULE | Freq: Three times a day (TID) | ORAL | 0 refills | Status: AC

## 2020-06-28 MED ORDER — ENSURE ENLIVE PO LIQD: 237.0000 mL | Freq: Two times a day (BID) | ORAL | 12 refills | Status: AC

## 2020-06-28 MED ORDER — AMOXICILLIN-POT CLAVULANATE 500-125 MG PO TABS: 1.0000 | ORAL_TABLET | Freq: Two times a day (BID) | ORAL | 0 refills | Status: AC

## 2020-06-28 NOTE — Discharge Summary (Signed)
Physician Discharge Summary [facility]    Patient: Elizabeth Jarvis                   Admit date: 06/19/2020   DOB: 1922/04/16             Discharge date:07-21-2020/9:40 AM RXY:[id number]                          PCP: Pcp, No  Disposition: SNF with palliative care  Recommendations for Outpatient Follow-up:   . Follow up: in 1 day with palliative care  Discharge Condition: Stable   Code Status:   Code Status: DNR  Diet recommendation: Regular healthy diet--prefers home food   Discharge Diagnoses:    Principal Problem:   Severe sepsis (HCC) Active Problems:   Weakness generalized   Acute kidney injury (HCC)   Anemia   Gastrointestinal hemorrhage   Acute blood loss anemia   Acute metabolic encephalopathy   Pressure injury of skin   Palliative care by specialist   Goals of care, counseling/discussion   General weakness   History of Present Illness/ Hospital Course Charline Bills Summary:   This 87 years old female with PMH significant for hypertension, GERD, chronic lower extremity lymphedema who was recently hospitalized from 1/23-1/26 for GI bleed and was discharged without intervention. Patient was medically managed during previous hospitalization and was discharged on Protonix and was recommended outpatient follow-up.  Patient has progressive decline in overall health. She presented in the ED with progressive weakness, recurrent GI bleed, altered mentation and syncopal episodes at home in the morning.  Per EMS she was found to be hypotensive with tachycardia and was afebrile. In ED  Lactic acid 5.1, serum creatinine 2.12, WBC 19.6, chest x-ray unremarkable, FOBT positive, CT abdomen: no acute inflammatory process, retained stool in the rectosigmoid, sigmoid diverticulosis.  Patient is admitted for sepsis, started on IV antibiotics, patient was given IV fluids. GI consulted, recommended RBC scan which was negative, underwent EGD which was unremarkable. Patient was given 2 units  of packed red blood cells.   Severe sepsis, concern for developing shock: -Sepsis physiology resolved  -She was found septic on admission,: sepsis criteria: hypothermia, tachycardia, leukocytosis, lactic acidosis with AKI and soft blood pressures.  - CT abdomen: no acute inflammatory process, retained stool in the rectosigmoid, sigmoid diverticulosis Blood culture: No growth so far.  -Urine culture grew Enterococcus faecalis >> IV Unasyn  completed.>> changed to p.o. Augmentin for 5 more days this will conclude a 14 days of antibiotic coverage. (As patient seems to be tolerating some p.o. now)   Status post IV fluids,  Lactic acid normalized 1.3  DNR/DNI, family patient's son on board  Blood loss anemia secondary to Acute GI bleed: -H&H has been stable--will not follow any further -Pursuing comfort care measures  Hb 10.2->6.3 in 2 weeks and recently hospitalized for GI bleed without endoscopic intervention. -Status post EGD and biopsy, results negative, no acute findings, also negative for H. pylori Gastroenterology consulted -status post evaluation, EGD - if she bleeds will consider colonoscopy. Patient received 2 units of packed red blood cells,  posttransfusion hemoglobin is 8.6. - Protonix 40 mg IV twice daily, switch to p.o. if tolerated H&H every 6 hours >> since stabilized switch to daily. -RBC scan negative.   GI recommended lifelong PPI,  -Monitoring hemoglobin 10.5, 10.7, 10.6  >> 9.1 ..   Syncope, likely vasovagal versus orthostatic in the setting of anemia Occurred during BM  at home and lasted few seconds. -We will discontinue telemetry  AKI on CKD 3b: >> Improving Baseline creatinine 1.1, creatinine 1.45>> 1.34 .Marland Kitchen. 1.02  -We will discontinue IV fluids, will encourage p.o. intake  Failure to thrive, multifactorial: anemia, AKI, sepsis, debility  Elevated troponin: - Troponins 40 x 2 flat. suspect secondary to demand ischemia,no chest  pain    Altered mental status, suspect acute toxic metabolic encephalopathy and possibly hypoperfusion: -Mental status continues to wax and wane, ... more interactive today Per son does understand [place] language She has been withdrawn--continues to be interactive  Goals of Care: Discussed with nursing staff, SW, Palliative care, Dr. Linna DarnerAnwar and his son  -Discussed with patient's son who is very involved in her care.  At this point agreed to SNF with palliative care following.  If patient continues to decline due to mentation and poor p.o. intake he is agreeable to proceed with hospice and comfort care measures  (Previous hospitalist has contacted patient's primary care physician Dr. Georgiann MohsAmit Brashear in [place]).    Poor appetite Continue appetite stimulant Megace, patient prefers home food   Hypokalemia Since patient is having poor p.o. intake we will replete with IV today.   DVT prophylaxis: SCDs Code Status: DNR/ DNI  Family Communication:  Spoke with son Mr.  Darryll CapersSandeep ... Agreeable to current plan Disposition Plan:    Dispo: The patient is from: Home  Anticipated d/c is to: SNF-with palliative care  Anticipated d/c date is: today.    Consultants:   Gastroenterology  Palliative care following      Nutritional status:  Nutrition Problem: Increased nutrient needs Etiology: acute illness (sepsis) Signs/Symptoms: estimated needs Interventions: Boost Breeze,Prostat,MVI  The patient's BMI is: Body mass index is 25.49 kg/m. I agree with the assessment and plan as outlined below: Nutrition Status: Nutrition Problem: Increased nutrient needs Etiology: acute illness (sepsis) Signs/Symptoms: estimated needs Interventions: Boost Breeze,Prostat,MVI     SKIN assessment: I agree with skin assessment and plan as outlined below: Pressure Injury 06/20/20 Sacrum Stage 1 -  Intact skin with non-blanchable redness of a localized area  usually over a bony prominence. (Active)  06/20/20 1800  Location: Sacrum  Location Orientation:   Staging: Stage 1 -  Intact skin with non-blanchable redness of a localized area usually over a bony prominence.  Wound Description (Comments):   Present on Admission:     Risk of unplanned readmission Score:     Discharge Instructions:   Discharge Instructions    Activity as tolerated - No restrictions   Complete by: As directed    Diet - low sodium heart healthy   Complete by: As directed    Discharge instructions   Complete by: As directed    Recommending continue palliative care.... If declines  (especially when mentation and p.o. intake) to pursue with hospice.   Discharge wound care:   Complete by: As directed    Per wound care instructions   Increase activity slowly   Complete by: As directed        Medication List    STOP taking these medications   b complex vitamins tablet   calcium-vitamin D 500-200 MG-UNIT tablet Commonly known as: OSCAL WITH D   pantoprazole 40 MG tablet Commonly known as: PROTONIX     TAKE these medications   acidophilus Caps capsule Take 2 capsules by mouth 3 (three) times daily for 10 days.   amoxicillin-clavulanate 500-125 MG tablet Commonly known as: AUGMENTIN Take 1 tablet (500 mg total) by  mouth 2 (two) times daily for 5 days.   BLINK TEARS OP Place 1 drop into both eyes 3 (three) times daily.   donepezil 5 MG tablet Commonly known as: ARICEPT Take 5 mg by mouth daily.   DULCOLAX PO Take 1 tablet by mouth daily as needed (constipation).   feeding supplement Liqd Take 237 mLs by mouth 2 (two) times daily between meals.   megestrol 40 MG/ML suspension Commonly known as: MEGACE Take 400 mg by mouth 2 (two) times daily.   nystatin 100000 UNIT/ML suspension Commonly known as: MYCOSTATIN Take 5 mLs by mouth 3 (three) times daily.   ondansetron 4 MG tablet Commonly known as: ZOFRAN Take 1 tablet (4 mg total) by mouth  every 6 (six) hours as needed for nausea.   polyethylene glycol 17 g packet Commonly known as: MiraLax Take 17 g by mouth 2 (two) times daily.   psyllium 95 % Pack Commonly known as: HYDROCIL/METAMUCIL Take 1 packet by mouth 2 (two) times daily.       Contact information for after-discharge care    Destination    [facility] .   Service: Skilled Nursing Contact information: [address] [place] [address] [phone]                 No Known Allergies   Procedures /Studies:   CT ABDOMEN PELVIS WO CONTRAST  Result Date: 06/19/2020 CLINICAL DATA:  87 year old female with abdominal pain. Known large bowel diverticula. EXAM: CT ABDOMEN AND PELVIS WITHOUT CONTRAST TECHNIQUE: Multidetector CT imaging of the abdomen and pelvis was performed following the standard protocol without IV contrast. COMPARISON:  CT Abdomen and Pelvis with contrast 05/30/2020. FINDINGS: Lower chest: Stable lung bases. Calcified coronary artery atherosclerosis. No cardiomegaly, pericardial or pleural effusion. Hepatobiliary: Diminutive or absent gallbladder as before. Bile ducts appear stable. Pancreas: Stable pancreatic atrophy. Spleen: Stable, diminutive. Adrenals/Urinary Tract: Negative adrenal glands. Diminutive but otherwise negative noncontrast kidneys. No nephrolithiasis or hydronephrosis. Decompressed proximal ureters. Diminutive and negative urinary bladder. Incidental pelvic phleboliths. Stomach/Bowel: Gas and low-density stool in the rectum. Abundant retained stool in the sigmoid colon with diverticulosis redemonstrated. No active inflammation. Decompressed left colon, transverse colon. Redundant but otherwise negative right colon. Negative terminal ileum. No pericecal inflammation. Appendix not identified and might be diminutive or absent. No dilated small bowel. Increased gas in the stomach which otherwise appears negative. Second portion duodenum diverticulum  redemonstrated with no adverse features. No free air, free fluid, mesenteric stranding. Vascular/Lymphatic: Normal caliber abdominal aorta. Aortoiliac calcified atherosclerosis. Hypodensity within the major vascular lumen suggests anemia. No lymphadenopathy. Reproductive: Negative noncontrast appearance. Other: No pelvic free fluid. Musculoskeletal: Osteopenia. Motion artifact in the lower chest. Chronic T12 through L3 compression fractures appear stable. Advanced disc and facet degeneration at the lumbosacral junction. Chronic right inferior pubic ramus deformity. No acute osseous abnormality identified. IMPRESSION: 1. No acute or inflammatory process identified in the non-contrast abdomen or pelvis. 2. Abundant retained stool in the rectosigmoid colon. Sigmoid diverticulosis without active inflammation. 3. Vessel density suspicious for anemia. Calcified coronary artery and Aortic Atherosclerosis (ICD10-I70.0). Electronically Signed   By: Odessa Fleming M.D.   On: 06/19/2020 11:27   NM GI Blood Loss  Result Date: 06/21/2020 CLINICAL DATA:  Bright red blood per rectum.  Decreased hemoglobin EXAM: NUCLEAR MEDICINE GASTROINTESTINAL BLEEDING SCAN TECHNIQUE: Sequential abdominal images were obtained following intravenous administration of Tc-36m labeled red blood cells. RADIOPHARMACEUTICALS:  22.4 mCi Tc-62m pertechnetate in-vitro labeled red cells. COMPARISON:  CT abdomen  06/19/2020 FINDINGS: Initial imaging demonstrates tagged red blood cells within the vascular structures and solid organs. No radiotracer accumulation to suggest active extravasation into the gastrointestinal tract. Imaging performed for 2 hours without evidence of gastrointestinal bleeding. IMPRESSION: No evidence of active gastrointestinal bleeding by tagged red blood cell scintigraphy. Electronically Signed   By: Genevive Bi M.D.   On: 06/21/2020 15:58   CT Abdomen Pelvis W Contrast  Result Date: 05/30/2020 CLINICAL DATA:  Left lower quadrant  abdominal pain EXAM: CT ABDOMEN AND PELVIS WITH CONTRAST TECHNIQUE: Multidetector CT imaging of the abdomen and pelvis was performed using the standard protocol following bolus administration of intravenous contrast. CONTRAST:  OMNIPAQUE IOHEXOL 300 MG/ML  SOLN COMPARISON:  01/07/2014 FINDINGS: Lower chest: No acute pleural or parenchymal lung disease. Calcified granuloma right middle lobe. Hepatobiliary: Evaluation limited by respiratory motion. No focal liver abnormalities. Gallbladder is decompressed. No evidence of cholelithiasis. Borderline dilation of the common bile duct measuring 11 mm, likely appropriate for patient age. No evidence of choledocholithiasis. Pancreas: Unremarkable. No pancreatic ductal dilatation or surrounding inflammatory changes. Spleen: Normal in size without focal abnormality. Adrenals/Urinary Tract: Respiratory motion limits evaluation. There is bilateral renal cortical atrophy. Subcentimeter cortical cyst upper pole right kidney. No urinary tract calculi or obstructive uropathy. The bladder is unremarkable. Adrenals are normal. Stomach/Bowel: Evaluation limited by respiratory motion. There is no bowel obstruction or ileus. Diverticulosis of the descending and sigmoid colon without diverticulitis. Moderate retained gas and stool within the rectal vault. No bowel wall thickening or inflammatory changes. Vascular/Lymphatic: Aortic atherosclerosis. No enlarged abdominal or pelvic lymph nodes. Reproductive: Uterus and bilateral adnexa are unremarkable. Other: No free fluid or free gas.  No abdominal wall hernia. Musculoskeletal: No acute or destructive bony lesions. Reconstructed images demonstrate no additional findings. IMPRESSION: 1. Diverticulosis without diverticulitis. 2. Borderline dilation of the common bile duct may be normal for age. No evidence of choledocholithiasis or cholelithiasis. 3. Otherwise no acute intra-abdominal or intrapelvic process on this study limited by  respiratory motion. Electronically Signed   By: Sharlet Salina M.D.   On: 05/30/2020 16:00   DG Chest Port 1 View  Result Date: 06/19/2020 CLINICAL DATA:  Weakness EXAM: PORTABLE CHEST 1 VIEW COMPARISON:  None. FINDINGS: Lungs are clear. Heart size and pulmonary vascularity are normal. No adenopathy. There is aortic atherosclerosis. No bone lesions. IMPRESSION: Lungs clear. Heart size normal. Aortic Atherosclerosis (ICD10-I70.0). Electronically Signed   By: Bretta Bang III M.D.   On: 06/19/2020 10:22     Subjective:   Patient was seen and examined 07-15-2020, 9:40 AM Patient stable today. No acute distress.  No issues overnight Stable for discharge.  Discharge Exam:    Vitals:   06/27/20 1403 06/27/20 2032 07/15/20 0500 07-15-20 0519  BP: (!) 102/54 125/70  137/72  Pulse: 95 (!) 109  86  Resp: 17 15  15   Temp: 97.6 F (36.4 C) 97.8 F (36.6 C)  98.6 F (37 C)  TempSrc: Oral Oral  Oral  SpO2: 100% 100%  100%  Weight:   59.2 kg   Height:        General: Pt lying comfortably in bed & appears in no obvious distress, baseline confusion Cardiovascular: S1 & S2 heard, RRR, S1/S2 +. No murmurs, rubs, gallops or clicks. No JVD or pedal edema. Respiratory: Clear to auscultation without wheezing, rhonchi or crackles. No increased work of breathing. Abdominal:  Non-distended, non-tender & soft. No organomegaly or masses appreciated. Normal bowel sounds heard. CNS: Alert and oriented.  No focal deficits. Extremities: no edema, no cyanosis Pressure Injury 06/20/20 Sacrum Stage 1 -  Intact skin with non-blanchable redness of a localized area usually over a bony prominence. (Active)  06/20/20 1800  Location: Sacrum  Location Orientation:   Staging: Stage 1 -  Intact skin with non-blanchable redness of a localized area usually over a bony prominence.  Wound Description (Comments):   Present on Admission:       The results of significant diagnostics from this hospitalization  (including imaging, microbiology, ancillary and laboratory) are listed below for reference.      Microbiology:   Recent Results (from the past 240 hour(s))  Urine culture     Status: Abnormal   Collection Time: 06/19/20  9:29 AM   Specimen: In/Out Cath Urine  Result Value Ref Range Status   Specimen Description   Final    IN/OUT CATH URINE Performed at [facility], [address], [place], [place] [address]    Special Requests   Final    NONE Performed at [facility], [address], [place], [place] [address]    Culture 80,000 COLONIES/mL ENTEROCOCCUS FAECIUM (A)  Final   Report Status 06/22/2020 FINAL  Final   Organism ID, Bacteria ENTEROCOCCUS FAECIUM (A)  Final      Susceptibility   Enterococcus faecium - MIC*    AMPICILLIN <=2 SENSITIVE Sensitive     NITROFURANTOIN 64 INTERMEDIATE Intermediate     VANCOMYCIN <=0.5 SENSITIVE Sensitive     * 80,000 COLONIES/mL ENTEROCOCCUS FAECIUM  Resp Panel by RT-PCR (Flu A&B, Covid) Nasopharyngeal Swab     Status: None   Collection Time: 06/19/20  9:31 AM   Specimen: Nasopharyngeal Swab; Nasopharyngeal(NP) swabs in vial transport medium  Result Value Ref Range Status   SARS Coronavirus 2 by RT PCR NEGATIVE NEGATIVE Final    Comment: (NOTE) SARS-CoV-2 target nucleic acids are NOT DETECTED.  The SARS-CoV-2 RNA is generally detectable in upper respiratory specimens during the acute phase of infection. The lowest concentration of SARS-CoV-2 viral copies this assay can detect is 138 copies/mL. A negative result does not preclude SARS-Cov-2 infection and should not be used as the sole basis for treatment or other patient management decisions. A negative result may occur with  improper specimen collection/handling, submission of specimen other than nasopharyngeal swab, presence of viral mutation(s) within the areas targeted by this assay, and inadequate number of viral copies(<138 copies/mL). A  negative result must be combined with clinical observations, patient history, and epidemiological information. The expected result is Negative.  Fact Sheet for Patients:  BloggerCourse.com  Fact Sheet for Healthcare Providers:  SeriousBroker.it  This test is no t yet approved or cleared by the [place] FDA and  has been authorized for detection and/or diagnosis of SARS-CoV-2 by FDA under an Emergency Use Authorization (EUA). This EUA will remain  in effect (meaning this test can be used) for the duration of the COVID-19 declaration under Section 564(b)(1) of the Act, 21 U.S.C.section 360bbb-3(b)(1), unless the authorization is terminated  or revoked sooner.       Influenza A by PCR NEGATIVE NEGATIVE Final   Influenza B by PCR NEGATIVE NEGATIVE Final    Comment: (NOTE) The Xpert Xpress SARS-CoV-2/FLU/RSV plus assay is intended as an aid in the diagnosis of influenza from Nasopharyngeal swab specimens and should not be used as a sole basis for treatment. Nasal washings and aspirates are unacceptable for Xpert Xpress SARS-CoV-2/FLU/RSV testing.  Fact Sheet for Patients: BloggerCourse.com  Fact Sheet for Healthcare Providers: SeriousBroker.it  This test is not yet approved or cleared by the [place] FDA and has been authorized for detection and/or diagnosis of SARS-CoV-2 by FDA under an Emergency Use Authorization (EUA). This EUA will remain in effect (meaning this test can be used) for the duration of the COVID-19 declaration under Section 564(b)(1) of the Act, 21 U.S.C. section 360bbb-3(b)(1), unless the authorization is terminated or revoked.  Performed at [facility], [address] [place], [place] [address]   Blood Culture (routine x 2)     Status: None   Collection Time: 06/19/20 10:16 AM   Specimen: BLOOD  Result Value Ref Range Status    Specimen Description   Final    BLOOD BLOOD RIGHT HAND Performed at [facility], [address] [place], [place] [address]    Special Requests   Final    BOTTLES DRAWN AEROBIC ONLY Blood Culture results may not be optimal due to an inadequate volume of blood received in culture bottles Performed at [facility], [address] [place], [place] [address]    Culture   Final    NO GROWTH 5 DAYS Performed at [facility], [address], [place], [place] [address]    Report Status 06/24/2020 FINAL  Final  Blood Culture (routine x 2)     Status: None   Collection Time: 06/19/20 11:48 AM   Specimen: BLOOD  Result Value Ref Range Status   Specimen Description   Final    BLOOD LEFT ANTECUBITAL Performed at [facility], [address] [place], [place] [address]    Special Requests   Final    BOTTLES DRAWN AEROBIC AND ANAEROBIC Blood Culture adequate volume Performed at [facility], [address] [place], [place] [address]    Culture   Final    NO GROWTH 5 DAYS Performed at [facility], [address] [place], [place] [address]    Report Status 06/24/2020 FINAL  Final     Labs:   CBC: Recent Labs  Lab 06/21/20 2335 06/22/20 0309 06/22/20 0657 06/22/20 1734 06/22/20 2244 06/23/20 0516 06/23/20 0844 06/24/20 0503  WBC 17.6* 17.6*  --   --   --  14.0*  --  11.8*  HGB 11.2* 11.3*   < > 11.4* 10.5* 10.7* 10.6* 9.1*  HCT 32.8* 33.6*   < > 33.9* 31.6* 31.7* 32.4* 27.9*  MCV 87.0 89.8  --   --   --  89.8  --  92.1  PLT 126* 132*  --   --   --  130*  --  119*   < > = values in this interval not displayed.   Basic Metabolic Panel: Recent Labs  Lab 06/21/20 2335 06/22/20 0309 06/23/20 0239 06/24/20 0503  NA 143 145 144 140  K 3.9 3.2* 2.9* 3.1*  CL 120* 120* 119* 116*  CO2 15* 16* 16* 18*  GLUCOSE 88 86 123* 131*  BUN 46* 44* 30* 22  CREATININE 1.50* 1.45* 1.34* 1.02*   CALCIUM 8.1* 8.2* 8.1* 7.9*  MG 1.7  --  1.5*  --   PHOS 2.5  --  2.1*  --    Liver Function Tests: Recent Labs  Lab 06/23/20 0239  AST 27  ALT 21  ALKPHOS 32*  BILITOT 1.4*  PROT 4.6*  ALBUMIN 2.4*   BNP (last 3 results) Recent Labs    05/30/20 1400 06/19/20  0930  BNP 96.0 57.1   Cardiac Enzymes: No results for input(s): CKTOTAL, CKMB, CKMBINDEX, TROPONINI in the last 168 hours. CBG: No results for input(s): GLUCAP in the last 168 hours. Hgb A1c No results for input(s): HGBA1C in the last 72 hours. Lipid Profile No results for input(s): CHOL, HDL, LDLCALC, TRIG, CHOLHDL, LDLDIRECT in the last 72 hours. Thyroid function studies No results for input(s): TSH, T4TOTAL, T3FREE, THYROIDAB in the last 72 hours.  Invalid input(s): FREET3 Anemia work up No results for input(s): VITAMINB12, FOLATE, FERRITIN, TIBC, IRON, RETICCTPCT in the last 72 hours. Urinalysis    Component Value Date/Time   COLORURINE YELLOW 06/19/2020 0929   APPEARANCEUR CLEAR 06/19/2020 0929   LABSPEC 1.020 06/19/2020 0929   PHURINE 5.0 06/19/2020 0929   GLUCOSEU NEGATIVE 06/19/2020 0929   HGBUR LARGE (A) 06/19/2020 0929   BILIRUBINUR NEGATIVE 06/19/2020 0929   KETONESUR NEGATIVE 06/19/2020 0929   PROTEINUR NEGATIVE 06/19/2020 0929   UROBILINOGEN 1.0 01/13/2014 1354   NITRITE NEGATIVE 06/19/2020 0929   LEUKOCYTESUR NEGATIVE 06/19/2020 0929   Pressure Injury 06/20/20 Sacrum Stage 1 -  Intact skin with non-blanchable redness of a localized area usually over a bony prominence. (Active)  06/20/20 1800  Location: Sacrum  Location Orientation:   Staging: Stage 1 -  Intact skin with non-blanchable redness of a localized area usually over a bony prominence.  Wound Description (Comments):   Present on Admission:        Time coordinating discharge: Over 55 minutes  SIGNED: Kendell Bane, MD, FACP, [facility]. [facility],  Please use [email or web address] to Page If 7PM-7AM, please contact  night-coverage [email or web address] 06/15/2020, 9:40 AM

## 2020-06-28 NOTE — TOC Progression Note (Addendum)
Transition of Care [facility]) - Progression Note    Patient Details  Name: Kody Vigil MRN: [id number] Date of Birth: 1921/10/25  Transition of Care [facility]) CM/SW Contact  [facility] Tavenner, Olegario Messier, RN Phone Number: 11-Jul-2020, 9:30 AM  Clinical Narrative: Awaiting decision from team on recc to offer son-SNF(have auth for [facility] residential hospice or SNF w/palliative or hospice. Several conversations with son about recc-will await team to finalize all concerns from son.   9:55a-Son Sandeep agrees to SNF w/PCS-TC [facility] rep Jeannine Kitten will contact son for paperwork, has auth, await rm#,tel# for nsg report.    Expected Discharge Plan: Skilled Nursing Facility Barriers to Discharge: Continued Medical Work up  Expected Discharge Plan and Services Expected Discharge Plan: Skilled Nursing Facility   Discharge Planning Services: CM Consult Post Acute Care Choice: Skilled Nursing Facility Living arrangements for the past 2 months: Single Family Home                                       Social Determinants of Health (SDOH) Interventions    Readmission Risk Interventions No flowsheet data found.

## 2020-06-28 NOTE — TOC Transition Note (Addendum)
Transition of Care [facility]) - CM/SW Discharge Note   Patient Details  Name: Elizabeth Jarvis MRN: [id number] Date of Birth: May 08, 1922  [facility]) CM/SW Contact:  Lanier Clam, RN Phone Number: 07-28-2020, 12:06 PM   Clinical Narrative: d/c to [facility] to rm#[phone],nsg to call report tel#[phone]-[facility] or main tel# if prior is busy-page nsg-[facility] tel#[phone].PTAR forms in printer, dnr to be put in packet, awaiting covid results. PTAR to be called once covid results completed.   3:29p-Change in rm# to [phone].Awaiting covid results prior calling PTAR. 4:47p-Spoke to [facility] rep [place]-patient can still come while waiting for covid results since she was vaccinated.PTAR called-Nsg aware. No further CM needs.  Final next level of care: Skilled Nursing Facility Barriers to Discharge: No Barriers Identified   Patient Goals and CMS Choice Patient states their goals for this hospitalization and ongoing recovery are:: go to rehab CMS Medicare.gov Compare Post Acute Care list provided to:: Patient Represenative (must comment) Choice offered to / list presented to : Adult Children  Discharge Placement                       Discharge Plan and Services   Discharge Planning Services: CM Consult Post Acute Care Choice: Skilled Nursing Facility                               Social Determinants of Health (SDOH) Interventions     Readmission Risk Interventions No flowsheet data found.

## 2020-06-28 NOTE — Progress Notes (Signed)
Report was called to [place], LPN @ [place]

## 2020-06-28 NOTE — Care Management Important Message (Signed)
Important Message  Patient Details IM Letter placed in Patient's room. Name: Elizabeth Jarvis MRN: [id number] Date of Birth: [birth year]   Medicare Important Message Given:  Yes     Caren Macadam 06/26/2020, 1:02 PM

## 2020-06-30 DIAGNOSIS — R627 Adult failure to thrive: Secondary | ICD-10-CM | POA: Diagnosis not present

## 2020-06-30 DIAGNOSIS — D649 Anemia, unspecified: Secondary | ICD-10-CM | POA: Diagnosis not present

## 2020-06-30 DIAGNOSIS — N179 Acute kidney failure, unspecified: Secondary | ICD-10-CM | POA: Diagnosis not present

## 2020-06-30 DIAGNOSIS — K922 Gastrointestinal hemorrhage, unspecified: Secondary | ICD-10-CM | POA: Diagnosis not present

## 2020-07-02 DIAGNOSIS — K922 Gastrointestinal hemorrhage, unspecified: Secondary | ICD-10-CM | POA: Diagnosis not present

## 2020-07-02 DIAGNOSIS — D649 Anemia, unspecified: Secondary | ICD-10-CM | POA: Diagnosis not present

## 2020-07-02 DIAGNOSIS — N179 Acute kidney failure, unspecified: Secondary | ICD-10-CM | POA: Diagnosis not present

## 2020-07-02 DIAGNOSIS — R627 Adult failure to thrive: Secondary | ICD-10-CM | POA: Diagnosis not present

## 2020-07-02 DIAGNOSIS — R55 Syncope and collapse: Secondary | ICD-10-CM | POA: Diagnosis not present

## 2020-07-06 DIAGNOSIS — 419620001 Death: Secondary | SNOMED CT | POA: Diagnosis not present

## 2020-07-06 DEATH — deceased

## 2020-07-09 DIAGNOSIS — R627 Adult failure to thrive: Secondary | ICD-10-CM | POA: Diagnosis not present

## 2020-08-06 DIAGNOSIS — 419620001 Death: Secondary | SNOMED CT | POA: Diagnosis not present

## 2020-08-06 DEATH — deceased

## 2021-12-25 IMAGING — NM NM GI BLOOD LOSS
2 series · 12 of 12 positions shown · non-contrast
Comparison: CT abdomen 06/19/2020

CLINICAL DATA: Bright red blood per rectum.  Decreased hemoglobin

EXAM:
NUCLEAR MEDICINE GASTROINTESTINAL BLEEDING SCAN
TECHNIQUE: Sequential abdominal images were obtained following intravenous
administration of Rc-FFm labeled red blood cells.
RADIOPHARMACEUTICALS:  22.4 mCi Rc-FFm pertechnetate in-vitro
labeled red cells.

[Series 1: 2 hr gi bleed · 3.28mm/px · 6 of 60 frames shown]
[frame 6/60]
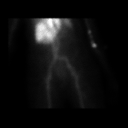
[frame 16/60]
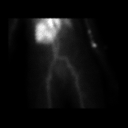
[frame 26/60]
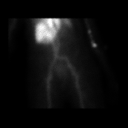
[frame 36/60]
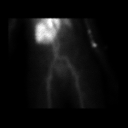
[frame 46/60]
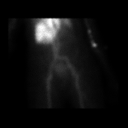
[frame 56/60]
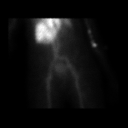

[Series 1: gi bleed hr1 · 3.28mm/px · 6 of 60 frames shown]
[frame 6/60]
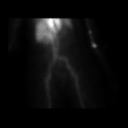
[frame 16/60]
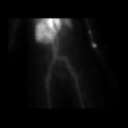
[frame 26/60]
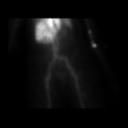
[frame 36/60]
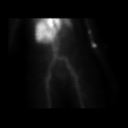
[frame 46/60]
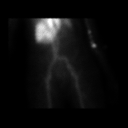
[frame 56/60]
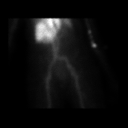

[12 of 12 positions shown; findings below may reference images not displayed]

FINDINGS: Initial imaging demonstrates tagged red blood cells within the
vascular structures and solid organs. No radiotracer accumulation to
suggest active extravasation into the gastrointestinal tract.
Imaging performed for 2 hours without evidence of gastrointestinal
bleeding.
IMPRESSION: No evidence of active gastrointestinal bleeding by tagged red blood
cell scintigraphy.
